# Patient Record
Sex: Male | Born: 1955 | Race: White | Hispanic: No | Smoking: Current some day smoker
Health system: Southern US, Community
[De-identification: ages and names within clinical notes are randomized; demographics above are authoritative.]

## PROBLEM LIST (undated history)

## (undated) DIAGNOSIS — L89154 Pressure ulcer of sacral region, stage 4: Secondary | ICD-10-CM

## (undated) DIAGNOSIS — G8929 Other chronic pain: Secondary | ICD-10-CM

## (undated) DIAGNOSIS — C189 Malignant neoplasm of colon, unspecified: Secondary | ICD-10-CM

## (undated) DIAGNOSIS — D649 Anemia, unspecified: Secondary | ICD-10-CM

## (undated) DIAGNOSIS — I10 Essential (primary) hypertension: Secondary | ICD-10-CM

## (undated) DIAGNOSIS — Z95828 Presence of other vascular implants and grafts: Secondary | ICD-10-CM

## (undated) DIAGNOSIS — R569 Unspecified convulsions: Secondary | ICD-10-CM

## (undated) DIAGNOSIS — Z89511 Acquired absence of right leg below knee: Secondary | ICD-10-CM

## (undated) DIAGNOSIS — L89322 Pressure ulcer of left buttock, stage 2: Secondary | ICD-10-CM

## (undated) DIAGNOSIS — A419 Sepsis, unspecified organism: Secondary | ICD-10-CM

## (undated) DIAGNOSIS — Z960 Presence of urogenital implants: Secondary | ICD-10-CM

## (undated) DIAGNOSIS — L8994 Pressure ulcer of unspecified site, stage 4: Secondary | ICD-10-CM

## (undated) DIAGNOSIS — F329 Major depressive disorder, single episode, unspecified: Secondary | ICD-10-CM

## (undated) DIAGNOSIS — G894 Chronic pain syndrome: Secondary | ICD-10-CM

## (undated) DIAGNOSIS — I219 Acute myocardial infarction, unspecified: Secondary | ICD-10-CM

## (undated) DIAGNOSIS — L89314 Pressure ulcer of right buttock, stage 4: Secondary | ICD-10-CM

## (undated) DIAGNOSIS — M8668 Other chronic osteomyelitis, other site: Secondary | ICD-10-CM

## (undated) DIAGNOSIS — W19XXXA Unspecified fall, initial encounter: Secondary | ICD-10-CM

## (undated) DIAGNOSIS — G9340 Encephalopathy, unspecified: Secondary | ICD-10-CM

## (undated) DIAGNOSIS — F4310 Post-traumatic stress disorder, unspecified: Secondary | ICD-10-CM

## (undated) DIAGNOSIS — Z89512 Acquired absence of left leg below knee: Secondary | ICD-10-CM

## (undated) HISTORY — PX: COLOSTOMY: SHX63

## (undated) HISTORY — PX: CORONARY ARTERY BYPASS GRAFT: SHX141

## (undated) HISTORY — PX: OTHER SURGICAL HISTORY: SHX169

## (undated) HISTORY — PX: TONSILLECTOMY: SUR1361

## (undated) HISTORY — PX: CHOLECYSTECTOMY: SHX55

## (undated) HISTORY — PX: BACK SURGERY: SHX140

## (undated) HISTORY — PX: APPENDECTOMY: SHX54

---

## 2007-09-11 ENCOUNTER — Ambulatory Visit (HOSPITAL_COMMUNITY): Admission: RE | Admit: 2007-09-11 | Discharge: 2007-09-11 | Payer: Self-pay | Admitting: Family Medicine

## 2007-12-02 ENCOUNTER — Inpatient Hospital Stay (HOSPITAL_COMMUNITY): Admission: AD | Admit: 2007-12-02 | Discharge: 2007-12-05 | Payer: Self-pay | Admitting: Family Medicine

## 2007-12-12 ENCOUNTER — Emergency Department (HOSPITAL_COMMUNITY): Admission: EM | Admit: 2007-12-12 | Discharge: 2007-12-13 | Payer: Self-pay | Admitting: Emergency Medicine

## 2007-12-24 ENCOUNTER — Inpatient Hospital Stay (HOSPITAL_COMMUNITY): Admission: EM | Admit: 2007-12-24 | Discharge: 2007-12-27 | Payer: Self-pay | Admitting: Emergency Medicine

## 2008-04-08 ENCOUNTER — Emergency Department (HOSPITAL_COMMUNITY): Admission: EM | Admit: 2008-04-08 | Discharge: 2008-04-08 | Payer: Self-pay | Admitting: Emergency Medicine

## 2008-04-09 ENCOUNTER — Inpatient Hospital Stay (HOSPITAL_COMMUNITY): Admission: EM | Admit: 2008-04-09 | Discharge: 2008-04-20 | Payer: Self-pay | Admitting: Emergency Medicine

## 2009-07-24 DIAGNOSIS — Z95828 Presence of other vascular implants and grafts: Secondary | ICD-10-CM

## 2009-07-24 HISTORY — DX: Presence of other vascular implants and grafts: Z95.828
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
## 2011-04-19 LAB — CBC
HCT: 41.2
Hemoglobin: 14.5
MCHC: 35.3
RBC: 4.65
RDW: 13.9

## 2011-04-19 LAB — LIPASE, BLOOD: Lipase: <10 — ABNORMAL LOW

## 2011-04-19 LAB — COMPREHENSIVE METABOLIC PANEL
ALT: 36
Alkaline Phosphatase: 109
BUN: 48 — ABNORMAL HIGH
CO2: 30
Calcium: 9.1
GFR calc non Af Amer: 50 — ABNORMAL LOW
Glucose, Bld: 126 — ABNORMAL HIGH
Potassium: 4.4
Sodium: 132 — ABNORMAL LOW

## 2011-04-19 LAB — DIFFERENTIAL
Basophils Relative: 0
Eosinophils Absolute: 0.1
Neutro Abs: 6.9
Neutrophils Relative %: 82 — ABNORMAL HIGH

## 2011-04-20 LAB — DIFFERENTIAL
Basophils Absolute: 0
Basophils Relative: 1
Basophils Relative: 1
Lymphocytes Relative: 11 — ABNORMAL LOW
Lymphs Abs: 0.9
Monocytes Relative: 4
Monocytes Relative: 4
Neutro Abs: 5
Neutro Abs: 7
Neutrophils Relative %: 82 — ABNORMAL HIGH
Neutrophils Relative %: 83 — ABNORMAL HIGH

## 2011-04-20 LAB — WOUND CULTURE

## 2011-04-20 LAB — URINALYSIS, ROUTINE W REFLEX MICROSCOPIC
Ketones, ur: NEGATIVE
Leukocytes, UA: NEGATIVE
Nitrite: NEGATIVE
Protein, ur: 30 — AB

## 2011-04-20 LAB — CBC
HCT: 33.5 — ABNORMAL LOW
Hemoglobin: 11.3 — ABNORMAL LOW
Hemoglobin: 12.2 — ABNORMAL LOW
MCHC: 34.4
MCV: 89.6
Platelets: 414 — ABNORMAL HIGH
RBC: 3.74 — ABNORMAL LOW
RBC: 3.8 — ABNORMAL LOW
RBC: 3.95 — ABNORMAL LOW
RDW: 13.6
WBC: 4.5
WBC: 5.1
WBC: 8.5

## 2011-04-20 LAB — BASIC METABOLIC PANEL
BUN: 32 — ABNORMAL HIGH
CO2: 27
Calcium: 8.1 — ABNORMAL LOW
Calcium: 8.5
Chloride: 108
Creatinine, Ser: 0.95
Creatinine, Ser: 1.38
Creatinine, Ser: 2.15 — ABNORMAL HIGH
Creatinine, Ser: 3.39 — ABNORMAL HIGH
GFR calc Af Amer: 23 — ABNORMAL LOW
GFR calc Af Amer: 40 — ABNORMAL LOW
GFR calc Af Amer: >60
GFR calc Af Amer: >60
GFR calc non Af Amer: 19 — ABNORMAL LOW
GFR calc non Af Amer: 33 — ABNORMAL LOW
GFR calc non Af Amer: 55 — ABNORMAL LOW
Potassium: 5.1
Sodium: 132 — ABNORMAL LOW
Sodium: 140

## 2011-04-20 LAB — PHOSPHORUS: Phosphorus: 3.9

## 2011-04-20 LAB — CULTURE, BLOOD (ROUTINE X 2): Culture: NO GROWTH

## 2011-04-24 LAB — BASIC METABOLIC PANEL
BUN: 13
CO2: 28
Calcium: 8.3 — ABNORMAL LOW
Calcium: 8.3 — ABNORMAL LOW
Chloride: 101
Chloride: 104
Creatinine, Ser: 0.7
Creatinine, Ser: 0.7
GFR calc Af Amer: >60
GFR calc Af Amer: >60
GFR calc Af Amer: >60
GFR calc non Af Amer: >60
GFR calc non Af Amer: >60
GFR calc non Af Amer: >60
Glucose, Bld: 102 — ABNORMAL HIGH
Potassium: 4.5
Potassium: 5
Sodium: 137
Sodium: 138
Sodium: 138

## 2011-04-24 LAB — CARDIAC PANEL(CRET KIN+CKTOT+MB+TROPI)
CK, MB: 3.2
CK, MB: 3.7
CK, MB: 4.3 — ABNORMAL HIGH
Relative Index: INVALID
Total CK: 47
Troponin I: 0.02
Troponin I: 0.03

## 2011-04-24 LAB — COMPREHENSIVE METABOLIC PANEL
ALT: 22
AST: 24
Albumin: 3.3 — ABNORMAL LOW
Alkaline Phosphatase: 127 — ABNORMAL HIGH
BUN: 13
Chloride: 100
GFR calc Af Amer: >60
Potassium: 3.1 — ABNORMAL LOW
Total Bilirubin: 0.7

## 2011-04-24 LAB — DIFFERENTIAL
Basophils Absolute: 0
Basophils Absolute: 0
Basophils Relative: 0
Basophils Relative: 1
Eosinophils Absolute: 0
Eosinophils Absolute: 0
Eosinophils Relative: 0
Lymphs Abs: 1.4
Monocytes Absolute: 0.3
Neutrophils Relative %: 72

## 2011-04-24 LAB — CBC
HCT: 38.6 — ABNORMAL LOW
MCV: 83.4
Platelets: 334
Platelets: 400
RDW: 14.5
WBC: 6.6
WBC: 6.7

## 2011-04-24 LAB — URINALYSIS, ROUTINE W REFLEX MICROSCOPIC
Glucose, UA: NEGATIVE
Ketones, ur: 15 — AB
Leukocytes, UA: NEGATIVE
Leukocytes, UA: NEGATIVE
Nitrite: NEGATIVE
Specific Gravity, Urine: 1.015
pH: 7
pH: 7.5

## 2011-04-24 LAB — URINE MICROSCOPIC-ADD ON

## 2011-04-24 LAB — RAPID URINE DRUG SCREEN, HOSP PERFORMED: Tetrahydrocannabinol: POSITIVE — AB

## 2011-04-24 LAB — ETHANOL: Alcohol, Ethyl (B): <5

## 2011-11-09 ENCOUNTER — Inpatient Hospital Stay (HOSPITAL_COMMUNITY): Payer: Medicaid Other

## 2011-11-09 ENCOUNTER — Encounter (HOSPITAL_COMMUNITY): Payer: Self-pay | Admitting: *Deleted

## 2011-11-09 ENCOUNTER — Emergency Department (HOSPITAL_COMMUNITY): Payer: Medicaid Other

## 2011-11-09 ENCOUNTER — Inpatient Hospital Stay (HOSPITAL_COMMUNITY)
Admission: EM | Admit: 2011-11-09 | Discharge: 2011-11-20 | DRG: 871 | Disposition: A | Discharge status: Home Health | Payer: Medicaid Other | Source: Ambulatory Visit | Attending: Internal Medicine | Admitting: Internal Medicine

## 2011-11-09 DIAGNOSIS — R7309 Other abnormal glucose: Secondary | ICD-10-CM | POA: Diagnosis present

## 2011-11-09 DIAGNOSIS — R0902 Hypoxemia: Secondary | ICD-10-CM | POA: Diagnosis present

## 2011-11-09 DIAGNOSIS — J9601 Acute respiratory failure with hypoxia: Secondary | ICD-10-CM | POA: Diagnosis present

## 2011-11-09 DIAGNOSIS — G9340 Encephalopathy, unspecified: Secondary | ICD-10-CM

## 2011-11-09 DIAGNOSIS — B9562 Methicillin resistant Staphylococcus aureus infection as the cause of diseases classified elsewhere: Secondary | ICD-10-CM | POA: Diagnosis present

## 2011-11-09 DIAGNOSIS — Z933 Colostomy status: Secondary | ICD-10-CM

## 2011-11-09 DIAGNOSIS — A419 Sepsis, unspecified organism: Secondary | ICD-10-CM | POA: Diagnosis present

## 2011-11-09 DIAGNOSIS — G894 Chronic pain syndrome: Secondary | ICD-10-CM | POA: Diagnosis present

## 2011-11-09 DIAGNOSIS — E46 Unspecified protein-calorie malnutrition: Secondary | ICD-10-CM | POA: Diagnosis present

## 2011-11-09 DIAGNOSIS — R7401 Elevation of levels of liver transaminase levels: Secondary | ICD-10-CM | POA: Diagnosis present

## 2011-11-09 DIAGNOSIS — R739 Hyperglycemia, unspecified: Secondary | ICD-10-CM | POA: Diagnosis present

## 2011-11-09 DIAGNOSIS — R6521 Severe sepsis with septic shock: Secondary | ICD-10-CM | POA: Diagnosis present

## 2011-11-09 DIAGNOSIS — G8929 Other chronic pain: Secondary | ICD-10-CM | POA: Diagnosis present

## 2011-11-09 DIAGNOSIS — R6520 Severe sepsis without septic shock: Secondary | ICD-10-CM | POA: Diagnosis present

## 2011-11-09 DIAGNOSIS — K922 Gastrointestinal hemorrhage, unspecified: Secondary | ICD-10-CM

## 2011-11-09 DIAGNOSIS — G9349 Other encephalopathy: Secondary | ICD-10-CM | POA: Diagnosis present

## 2011-11-09 DIAGNOSIS — B952 Enterococcus as the cause of diseases classified elsewhere: Secondary | ICD-10-CM | POA: Diagnosis present

## 2011-11-09 DIAGNOSIS — A4102 Sepsis due to Methicillin resistant Staphylococcus aureus: Principal | ICD-10-CM | POA: Diagnosis present

## 2011-11-09 DIAGNOSIS — Z1639 Resistance to other specified antimicrobial drug: Secondary | ICD-10-CM | POA: Diagnosis present

## 2011-11-09 DIAGNOSIS — J9610 Chronic respiratory failure, unspecified whether with hypoxia or hypercapnia: Secondary | ICD-10-CM

## 2011-11-09 DIAGNOSIS — S88119A Complete traumatic amputation at level between knee and ankle, unspecified lower leg, initial encounter: Secondary | ICD-10-CM

## 2011-11-09 DIAGNOSIS — E871 Hypo-osmolality and hyponatremia: Secondary | ICD-10-CM | POA: Diagnosis present

## 2011-11-09 DIAGNOSIS — D473 Essential (hemorrhagic) thrombocythemia: Secondary | ICD-10-CM | POA: Diagnosis present

## 2011-11-09 DIAGNOSIS — Z85038 Personal history of other malignant neoplasm of large intestine: Secondary | ICD-10-CM

## 2011-11-09 DIAGNOSIS — R509 Fever, unspecified: Secondary | ICD-10-CM

## 2011-11-09 DIAGNOSIS — N390 Urinary tract infection, site not specified: Secondary | ICD-10-CM | POA: Diagnosis present

## 2011-11-09 DIAGNOSIS — J9620 Acute and chronic respiratory failure, unspecified whether with hypoxia or hypercapnia: Secondary | ICD-10-CM | POA: Diagnosis present

## 2011-11-09 DIAGNOSIS — L89309 Pressure ulcer of unspecified buttock, unspecified stage: Secondary | ICD-10-CM | POA: Diagnosis present

## 2011-11-09 DIAGNOSIS — D75839 Thrombocytosis, unspecified: Secondary | ICD-10-CM | POA: Diagnosis present

## 2011-11-09 DIAGNOSIS — Z79899 Other long term (current) drug therapy: Secondary | ICD-10-CM

## 2011-11-09 DIAGNOSIS — F418 Other specified anxiety disorders: Secondary | ICD-10-CM | POA: Diagnosis present

## 2011-11-09 DIAGNOSIS — F341 Dysthymic disorder: Secondary | ICD-10-CM | POA: Diagnosis present

## 2011-11-09 DIAGNOSIS — R748 Abnormal levels of other serum enzymes: Secondary | ICD-10-CM

## 2011-11-09 DIAGNOSIS — Z978 Presence of other specified devices: Secondary | ICD-10-CM

## 2011-11-09 DIAGNOSIS — L8990 Pressure ulcer of unspecified site, unspecified stage: Secondary | ICD-10-CM | POA: Diagnosis present

## 2011-11-09 DIAGNOSIS — R7402 Elevation of levels of lactic acid dehydrogenase (LDH): Secondary | ICD-10-CM | POA: Diagnosis present

## 2011-11-09 DIAGNOSIS — D649 Anemia, unspecified: Secondary | ICD-10-CM

## 2011-11-09 DIAGNOSIS — J9600 Acute respiratory failure, unspecified whether with hypoxia or hypercapnia: Secondary | ICD-10-CM

## 2011-11-09 DIAGNOSIS — R7881 Bacteremia: Secondary | ICD-10-CM | POA: Diagnosis present

## 2011-11-09 DIAGNOSIS — A491 Streptococcal infection, unspecified site: Secondary | ICD-10-CM | POA: Diagnosis present

## 2011-11-09 DIAGNOSIS — Z89611 Acquired absence of right leg above knee: Secondary | ICD-10-CM

## 2011-11-09 DIAGNOSIS — Z89612 Acquired absence of left leg above knee: Secondary | ICD-10-CM

## 2011-11-09 DIAGNOSIS — G40909 Epilepsy, unspecified, not intractable, without status epilepticus: Secondary | ICD-10-CM | POA: Diagnosis present

## 2011-11-09 DIAGNOSIS — E876 Hypokalemia: Secondary | ICD-10-CM | POA: Diagnosis not present

## 2011-11-09 HISTORY — DX: Malignant neoplasm of colon, unspecified: C18.9

## 2011-11-09 HISTORY — DX: Unspecified convulsions: R56.9

## 2011-11-09 HISTORY — DX: Anemia, unspecified: D64.9

## 2011-11-09 HISTORY — DX: Encephalopathy, unspecified: G93.40

## 2011-11-09 LAB — COMPREHENSIVE METABOLIC PANEL
AST: 273 U/L — ABNORMAL HIGH (ref 0–37)
AST: 163 U/L — ABNORMAL HIGH (ref 0–37)
CO2: 31 mEq/L (ref 19–32)
CO2: 27 mEq/L (ref 19–32)
Calcium: 9.5 mg/dL (ref 8.4–10.5)
Chloride: 92 mEq/L — ABNORMAL LOW (ref 96–112)
Chloride: 99 mEq/L (ref 96–112)
Creatinine, Ser: 0.66 mg/dL (ref 0.50–1.35)
Creatinine, Ser: 0.89 mg/dL (ref 0.50–1.35)
GFR calc Af Amer: >90 mL/min (ref >90)
GFR calc non Af Amer: >90 mL/min (ref >90)
GFR calc non Af Amer: >90 mL/min (ref >90)
Glucose, Bld: 233 mg/dL — ABNORMAL HIGH (ref 70–99)
Total Bilirubin: 0.9 mg/dL (ref 0.3–1.2)
Total Bilirubin: 1 mg/dL (ref 0.3–1.2)

## 2011-11-09 LAB — DIFFERENTIAL
Basophils Absolute: 0 10*3/uL (ref 0.0–0.1)
Basophils Relative: 0 % (ref 0–1)
Basophils Relative: 0 % (ref 0–1)
Eosinophils Absolute: 0.1 10*3/uL (ref 0.0–0.7)
Eosinophils Relative: 1 % (ref 0–5)
Lymphocytes Relative: 14 % (ref 12–46)
Lymphs Abs: 0.7 10*3/uL (ref 0.7–4.0)
Lymphs Abs: 0.1 10*3/uL — ABNORMAL LOW (ref 0.7–4.0)
Monocytes Absolute: 0.7 10*3/uL (ref 0.1–1.0)
Monocytes Relative: 14 % — ABNORMAL HIGH (ref 3–12)
Neutro Abs: 3.5 10*3/uL (ref 1.7–7.7)
Neutro Abs: 1.4 10*3/uL — ABNORMAL LOW (ref 1.7–7.7)

## 2011-11-09 LAB — PHOSPHORUS: Phosphorus: 2.6 mg/dL (ref 2.3–4.6)

## 2011-11-09 LAB — CBC
HCT: 32.3 % — ABNORMAL LOW (ref 39.0–52.0)
HCT: 24.2 % — ABNORMAL LOW (ref 39.0–52.0)
Hemoglobin: 9.7 g/dL — ABNORMAL LOW (ref 13.0–17.0)
MCH: 24.5 pg — ABNORMAL LOW (ref 26.0–34.0)
MCHC: 30 g/dL (ref 30.0–36.0)
MCV: 81.6 fL (ref 78.0–100.0)
MCV: 80.7 fL (ref 78.0–100.0)
RBC: 3 MIL/uL — ABNORMAL LOW (ref 4.22–5.81)
RDW: 16.6 % — ABNORMAL HIGH (ref 11.5–15.5)
RDW: 16.8 % — ABNORMAL HIGH (ref 11.5–15.5)
WBC: 1.7 10*3/uL — ABNORMAL LOW (ref 4.0–10.5)

## 2011-11-09 LAB — BLOOD GAS, ARTERIAL
MECHVT: 550 mL
PEEP: 5 cmH2O
Patient temperature: 37
pCO2 arterial: 49.5 mmHg — ABNORMAL HIGH (ref 35.0–45.0)
pH, Arterial: 7.384 (ref 7.350–7.450)

## 2011-11-09 LAB — URINALYSIS, ROUTINE W REFLEX MICROSCOPIC
Bilirubin Urine: NEGATIVE
Glucose, UA: NEGATIVE mg/dL
Ketones, ur: NEGATIVE mg/dL
pH: 5.5 (ref 5.0–8.0)

## 2011-11-09 LAB — LACTIC ACID, PLASMA: Lactic Acid, Venous: 3.8 mmol/L — ABNORMAL HIGH (ref 0.5–2.2)

## 2011-11-09 LAB — PHENOBARBITAL LEVEL: Phenobarbital: 7 ug/mL — ABNORMAL LOW (ref 15.0–40.0)

## 2011-11-09 LAB — CARDIAC PANEL(CRET KIN+CKTOT+MB+TROPI)
CK, MB: 1.1 ng/mL (ref 0.3–4.0)
Relative Index: INVALID (ref 0.0–2.5)
Total CK: 81 U/L (ref 7–232)

## 2011-11-09 LAB — RAPID URINE DRUG SCREEN, HOSP PERFORMED: Amphetamines: NOT DETECTED

## 2011-11-09 LAB — PROTIME-INR: INR: 1.37 (ref 0.00–1.49)

## 2011-11-09 LAB — CARBOXYHEMOGLOBIN: Total hemoglobin: 8.1 g/dL — ABNORMAL LOW (ref 13.5–18.0)

## 2011-11-09 LAB — GLUCOSE, CAPILLARY: Glucose-Capillary: 165 mg/dL — ABNORMAL HIGH (ref 70–99)

## 2011-11-09 LAB — URINE MICROSCOPIC-ADD ON

## 2011-11-09 LAB — APTT: aPTT: 40 seconds — ABNORMAL HIGH (ref 24–37)

## 2011-11-09 MED ORDER — ETOMIDATE 2 MG/ML IV SOLN: 20.0000 mg | Freq: Once | INTRAVENOUS | Status: AC

## 2011-11-09 MED ORDER — VANCOMYCIN HCL IN DEXTROSE 1-5 GM/200ML-% IV SOLN
1000.0000 mg | Freq: Two times a day (BID) | INTRAVENOUS | Status: DC
  Administered 2011-11-09 – 2011-11-11 (×4): 1000 mg via INTRAVENOUS
  Filled 2011-11-09 (×5): qty 200

## 2011-11-09 MED ORDER — SODIUM CHLORIDE 0.9 % IV SOLN
50.0000 ug/h | INTRAVENOUS | Status: DC
  Filled 2011-11-09: qty 50

## 2011-11-09 MED ORDER — CHLORHEXIDINE GLUCONATE 0.12 % MT SOLN
15.0000 mL | Freq: Two times a day (BID) | OROMUCOSAL | Status: DC
  Administered 2011-11-09 – 2011-11-11 (×4): 15 mL via OROMUCOSAL
  Filled 2011-11-09 (×4): qty 15

## 2011-11-09 MED ORDER — PROMETHAZINE HCL 25 MG/ML IJ SOLN
12.5000 mg | Freq: Once | INTRAMUSCULAR | Status: AC
  Administered 2011-11-09: 12.5 mg via INTRAVENOUS

## 2011-11-09 MED ORDER — VANCOMYCIN HCL IN DEXTROSE 1-5 GM/200ML-% IV SOLN
1000.0000 mg | Freq: Two times a day (BID) | INTRAVENOUS | Status: DC
  Filled 2011-11-09: qty 200

## 2011-11-09 MED ORDER — BIOTENE DRY MOUTH MT LIQD
1.0000 "application " | Freq: Four times a day (QID) | OROMUCOSAL | Status: DC
  Administered 2011-11-10 – 2011-11-11 (×6): 15 mL via OROMUCOSAL

## 2011-11-09 MED ORDER — PIPERACILLIN-TAZOBACTAM 3.375 G IVPB
3.3750 g | Freq: Three times a day (TID) | INTRAVENOUS | Status: DC
  Administered 2011-11-10 (×2): 3.375 g via INTRAVENOUS
  Filled 2011-11-09 (×3): qty 50

## 2011-11-09 MED ORDER — SODIUM CHLORIDE 0.9 % IV SOLN
20.0000 ug/h | INTRAVENOUS | Status: DC
  Administered 2011-11-09: 20 ug/h via INTRAVENOUS
  Filled 2011-11-09: qty 50

## 2011-11-09 MED ORDER — PROMETHAZINE HCL 25 MG/ML IJ SOLN
INTRAMUSCULAR | Status: AC
  Administered 2011-11-09: 12.5 mg via INTRAVENOUS
  Filled 2011-11-09: qty 1

## 2011-11-09 MED ORDER — SODIUM CHLORIDE 0.9 % IV SOLN
1.0000 mg/h | INTRAVENOUS | Status: DC
  Administered 2011-11-09: 2 mg/h via INTRAVENOUS
  Administered 2011-11-10 (×2): 8 mg/h via INTRAVENOUS
  Administered 2011-11-10 – 2011-11-11 (×4): 10 mg/h via INTRAVENOUS
  Filled 2011-11-09 (×7): qty 10

## 2011-11-09 MED ORDER — FUROSEMIDE 10 MG/ML IJ SOLN
80.0000 mg | Freq: Once | INTRAMUSCULAR | Status: AC
  Administered 2011-11-09: 80 mg via INTRAVENOUS

## 2011-11-09 MED ORDER — SODIUM CHLORIDE 0.9 % IV BOLUS (SEPSIS)
500.0000 mL | INTRAVENOUS | Status: AC
  Administered 2011-11-09: 500 mL via INTRAVENOUS

## 2011-11-09 MED ORDER — PROPOFOL 10 MG/ML IV EMUL
INTRAVENOUS | Status: AC
  Filled 2011-11-09: qty 100

## 2011-11-09 MED ORDER — SODIUM CHLORIDE 0.9 % IV SOLN: 250.0000 mL | INTRAVENOUS | Status: DC | PRN

## 2011-11-09 MED ORDER — NOREPINEPHRINE BITARTRATE 1 MG/ML IJ SOLN
2.0000 ug/min | INTRAVENOUS | Status: DC
  Filled 2011-11-09: qty 4

## 2011-11-09 MED ORDER — MIDAZOLAM HCL 2 MG/2ML IJ SOLN
INTRAMUSCULAR | Status: AC
  Administered 2011-11-09: 2 mg
  Filled 2011-11-09: qty 4

## 2011-11-09 MED ORDER — ETOMIDATE 2 MG/ML IV SOLN
20.0000 mg | Freq: Once | INTRAVENOUS | Status: AC
  Administered 2011-11-09: 20 mg via INTRAVENOUS

## 2011-11-09 MED ORDER — PIPERACILLIN-TAZOBACTAM 3.375 G IVPB
3.3750 g | Freq: Once | INTRAVENOUS | Status: AC
  Administered 2011-11-09: 3.375 g via INTRAVENOUS
  Filled 2011-11-09: qty 50

## 2011-11-09 MED ORDER — SODIUM CHLORIDE 0.9 % IV BOLUS (SEPSIS)
1000.0000 mL | Freq: Once | INTRAVENOUS | Status: AC
  Administered 2011-11-09: 1000 mL via INTRAVENOUS

## 2011-11-09 MED ORDER — PANTOPRAZOLE SODIUM 40 MG IV SOLR
40.0000 mg | Freq: Every day | INTRAVENOUS | Status: DC
  Administered 2011-11-09: 40 mg via INTRAVENOUS
  Filled 2011-11-09 (×2): qty 40

## 2011-11-09 MED ORDER — NOREPINEPHRINE BITARTRATE 1 MG/ML IJ SOLN
2.0000 ug/min | INTRAVENOUS | Status: DC
  Administered 2011-11-09: 14 ug/min via INTRAVENOUS
  Administered 2011-11-09: 20 ug/min via INTRAVENOUS
  Administered 2011-11-10: 6 ug/min via INTRAVENOUS
  Administered 2011-11-11: 4 ug/min via INTRAVENOUS
  Filled 2011-11-09 (×3): qty 4

## 2011-11-09 MED ORDER — SUCCINYLCHOLINE CHLORIDE 20 MG/ML IJ SOLN: 100.0000 mg | Freq: Once | INTRAMUSCULAR | Status: AC

## 2011-11-09 MED ORDER — FENTANYL CITRATE 0.05 MG/ML IJ SOLN: 50.0000 ug | INTRAMUSCULAR | Status: DC | PRN

## 2011-11-09 MED ORDER — SUCCINYLCHOLINE CHLORIDE 20 MG/ML IJ SOLN
100.0000 mg | Freq: Once | INTRAMUSCULAR | Status: AC
  Administered 2011-11-09: 100 mg via INTRAVENOUS

## 2011-11-09 MED ORDER — METHYLPREDNISOLONE SODIUM SUCC 125 MG IJ SOLR
INTRAMUSCULAR | Status: AC
  Filled 2011-11-09: qty 2

## 2011-11-09 MED ORDER — SODIUM CHLORIDE 0.9 % IV SOLN
8.0000 mg | Freq: Once | INTRAVENOUS | Status: AC
  Administered 2011-11-09: 8 mg via INTRAVENOUS
  Filled 2011-11-09 (×2): qty 4

## 2011-11-09 MED ORDER — ACETAMINOPHEN 10 MG/ML IV SOLN
1000.0000 mg | Freq: Four times a day (QID) | INTRAVENOUS | Status: DC
  Administered 2011-11-09: 1000 mg via INTRAVENOUS
  Filled 2011-11-09 (×3): qty 100

## 2011-11-09 MED ORDER — HEPARIN SODIUM (PORCINE) 5000 UNIT/ML IJ SOLN
5000.0000 [IU] | Freq: Two times a day (BID) | INTRAMUSCULAR | Status: DC
  Administered 2011-11-09: 5000 [IU] via SUBCUTANEOUS
  Filled 2011-11-09 (×3): qty 1

## 2011-11-09 MED ORDER — PROPOFOL 10 MG/ML IV BOLUS
20.0000 mg | Freq: Once | INTRAVENOUS | Status: AC
  Administered 2011-11-09: 20 mg via INTRAVENOUS

## 2011-11-09 MED ORDER — INSULIN ASPART 100 UNIT/ML ~~LOC~~ SOLN
0.0000 [IU] | SUBCUTANEOUS | Status: DC
  Administered 2011-11-09: 3 [IU] via SUBCUTANEOUS
  Administered 2011-11-10: 2 [IU] via SUBCUTANEOUS

## 2011-11-09 MED ORDER — SODIUM CHLORIDE 0.9 % IV BOLUS (SEPSIS): 1000.0000 mL | Freq: Once | INTRAVENOUS | Status: DC

## 2011-11-09 MED ORDER — PROPOFOL 10 MG/ML IV EMUL
400.0000 ug/min | Freq: Once | INTRAVENOUS | Status: AC
  Administered 2011-11-09: 400 ug/min via INTRAVENOUS

## 2011-11-09 MED ORDER — VANCOMYCIN HCL IN DEXTROSE 1-5 GM/200ML-% IV SOLN
1000.0000 mg | Freq: Once | INTRAVENOUS | Status: DC
  Filled 2011-11-09: qty 200

## 2011-11-09 MED ORDER — CLINDAMYCIN PHOSPHATE 900 MG/50ML IV SOLN
900.0000 mg | Freq: Once | INTRAVENOUS | Status: DC
  Administered 2011-11-09: 900 mg via INTRAVENOUS
  Filled 2011-11-09: qty 50

## 2011-11-09 MED ORDER — SODIUM CHLORIDE 0.9 % IV SOLN
25.0000 ug/h | INTRAVENOUS | Status: DC
  Administered 2011-11-09: 50 ug/h via INTRAVENOUS
  Administered 2011-11-11 (×2): 400 ug/h via INTRAVENOUS
  Filled 2011-11-09 (×4): qty 50

## 2011-11-09 MED ORDER — IPRATROPIUM-ALBUTEROL 18-103 MCG/ACT IN AERO
6.0000 | INHALATION_SPRAY | RESPIRATORY_TRACT | Status: DC
  Administered 2011-11-10 (×3): 6 via RESPIRATORY_TRACT
  Filled 2011-11-09: qty 14.7

## 2011-11-09 MED ORDER — SODIUM CHLORIDE 0.9 % IV BOLUS (SEPSIS)
2000.0000 mL | Freq: Once | INTRAVENOUS | Status: DC
  Administered 2011-11-09: 2000 mL via INTRAVENOUS

## 2011-11-09 MED ORDER — IPRATROPIUM-ALBUTEROL 18-103 MCG/ACT IN AERO
6.0000 | INHALATION_SPRAY | RESPIRATORY_TRACT | Status: DC | PRN
  Administered 2011-11-10: 6 via RESPIRATORY_TRACT

## 2011-11-09 MED ORDER — FENTANYL BOLUS VIA INFUSION
50.0000 ug | Freq: Four times a day (QID) | INTRAVENOUS | Status: DC | PRN
  Filled 2011-11-09: qty 100

## 2011-11-09 NOTE — ED Notes (Signed)
Coffee ground emesis noted inside cpap mask upon arrival.

## 2011-11-09 NOTE — ED Notes (Signed)
Silver colored band with green triangles placed in medicine bag.  Silver colored ring with orange star in center placed in medicine bag.  Medicine bag placed in belongings bag and sent with pt.

## 2011-11-09 NOTE — ED Notes (Signed)
Per EMS - called out for resp distress that started today.  Pt having audible rales upon arrival per EMS.  C pap in place upon arrival.  Given solumedrol 125mg  IV en route.

## 2011-11-09 NOTE — Procedures (Signed)
Arterial Catheter Insertion Procedure Note Chris [name] [id number] 06/28/1956  Procedure: Insertion of Arterial Catheter  Indications: Blood pressure monitoring and Frequent blood sampling  Procedure Details Consent: Unable to obtain consent because of emergent medical necessity. Time Out: Verified patient identification, verified procedure, site/side was marked, verified correct patient position, special equipment/implants available, medications/allergies/relevent history reviewed, required imaging and test results available.  Performed  Maximum sterile technique was used including antiseptics, gloves, gown, hand hygiene and mask. Skin prep: Chlorhexidine; local anesthetic administered 20 gauge catheter was inserted into left radial artery using the Seldinger technique.  Evaluation Blood flow good; BP tracing good. Complications: No apparent complications.   Malachi Paradise 11/09/2011

## 2011-11-09 NOTE — Progress Notes (Addendum)
[facility]Link Physician-Brief Progress Note Patient Name: CLARANCE BOLLARD DOB: 06/04/1956 MRN: [id number]  Date of Service  11/09/2011   HPI/Events of Note   Took call from Dr Lynelle Doctor in [facility] ER at around 16.30 Patient showed up now in 2100. SIRS with fever and high wbc,  Sepsis with source likely his foul smelling decub and/or pna (vomit when ems found him). Lactate 3.8. So, occult septic shock  Got vanc, zosyn, clinda in ER per report from DR Lynelle Doctor  Note: portocath +  Now hypotensive posibly due to diprivan  intubbated due to unresponsiveness  eICU Interventions  Send admit orders Check stat labs Call code seepsis Needs cvl     Intervention Category Major Interventions: Sepsis - evaluation and management;Respiratory failure - evaluation and management  Jamareon Shimel 11/09/2011, 6:36 PM

## 2011-11-09 NOTE — ED Notes (Signed)
Vanc, Clindamycin, and 2nd liter of NS given to [facility] to give in transport.
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
## 2011-11-09 NOTE — H&P (Signed)
Name: Chris [name] MRN: [id number] DOB: Jul 15, 1956    LOS: 0  PCCM ADMISSION NOTE  Active Problems:  Acute respiratory failure with hypoxia  Septic shock  Thrombocytosis  Encephalopathy acute  Seizure disorder  Chronic respiratory failure  Elevated liver enzymes  Malnutrition  Hyperglycemia  Hyponatremia  Chronic anemia  History of Present Illness: 56 yo with colon CA and seizure disorder c/o dyspnea since 24 hours ago found unresponsive by home health nurse.  Diffuse rales , hypoxemia (SpO2 72% on ambient air). Treated with NIMV / steroids on the scene.  Intubated in [facility] ED.  Found to by hypotensive with rectal temperature of 106F.  Fluids / pressors started.  Transported to [facility] ICU for farther management.  Lines / Drains: 4/18  OETT 4/18  NGT 4/18  Foley 4/18  L [place] CVL  Cultures: 4/18  Blood 4/18  Urine 4/18  Sputum  Antibiotics: 4/18  Clindamycin x 1 4/18  Vancomycin 4/18  Zosyn  Tests / Events: 4/18  Intubated for hypoxemic respiratory failure.  Febrile, hypotensive, sepsis suspected, pressors started.  The patient is sedated, intubated and unable to provide history, which was obtained for available medical records.    Past Medical History  Diagnosis Date  . Colon cancer   . Seizures    Past Surgical History  Procedure Date  . Colostomy    Prior to Admission medications   Not on File   Allergies Allergies  Allergen Reactions  . Ibuprofen   . Naproxen   . Toradol    Family History No family history on file.  Social History  does not have a smoking history on file. He does not have any smokeless tobacco history on file. His alcohol and drug histories not on file.  Review Of Systems  Patient unable to provide  Vital Signs: Temp:  [103.4 F (39.7 C)-106 F (41.1 C)] 103.4 F (39.7 C) (04/18 1701) Pulse Rate:  [139-149] 139  (04/18 1700) Resp:  [30-40] 30  (04/18 1700) BP: (99-153)/(46-79) 99/46 mmHg (04/18 1700) SpO2:  [80 %-99 %] 99 %  (04/18 1700) FiO2 (%):  [40 %-100 %] 40 % (04/18 1820) Weight:  [72.576 kg (160 lb)] 72.576 kg (160 lb) (04/18 1639) I/O last 3 completed shifts: In: -  Out: 1000 [Emesis/NG output:1000]  Physical Examination: General:  Appears chronically ill, poorly kept Neuro:  Nonfocal, agitated when sedation is decreaed HEENT:  Dry membranes, ETT, NGT Neck:  Supple, no JVD   Cardiovascular:  Tachycardic, regular Lungs:  Bilateral air entry, coarse rhonchi, multiple well healed chest wall scars Abdomen:  Soft, nontender, nondistended, bowel sounds decreased, viable-looking colostomy Musculoskeletal:  Bilateral AKA Skin:  No rash  Ventilator settings: Vent Mode:  [-] PRVC FiO2 (%):  [40 %-100 %] 40 % Set Rate:  [14 bmp-20 bmp] 20 bmp Vt Set:  [550 mL-650 mL] 650 mL PEEP:  [4.9 cmH20-5 cmH20] 4.9 cmH20 Plateau Pressure:  [18 cmH20] 18 cmH20  Labs and Imaging:  Reviewed.  Please refer to the Assessment and Plan section for relevant results.  ASSESSMENT AND PLAN  NEUROLOGIC A:  Acute encephalopathy (metabolic, postictal, sedation). History of seizures ("has them regular" according to sister) on Phenobarbital (?).  Chronic opioids (OxyContin 240 mg PO TID (!).  History of CVA. P: -->  D/c Propofol as hypotensive -->  Versed / Fentanyl gtt to goal RASS 0 to -1 -->  Daily WUA -->  Phenobarbital level -->  Toxicology -->  Head CT when stable  PULMONARY  Lab 11/09/11 1555  PHART 7.384  PCO2ART 49.5*  PO2ART 132.0*  HCO3 28.9*  O2SAT 98.5   A:  Acute hypoxemic respiratory failure.  Chronic respiratory failure.  CXR >>> ETT, CVL in good position, cannot visualize NGT tip / side port, no overt infiltrates.  No clear history of PE. But IVC filter in place.  History of MI. P: -->  Full mechanical support -->  Goal pH>7.30, goal SpO2>92 -->  Follow up ABG -->  Follow up CXR -->  Bronchodilators -->  Daily SBT  CARDIOVASCULAR  Lab 11/09/11 1510  TROPONINI <0.30  LATICACIDVEN 3.8*    PROBNP 393.9*   A:  Shock, presumed septic, source unclear.  No active ischemia. P: -->  NS boluses to goal CVP>10 -->  Levophed to goal MAP>60 -->  Trend sepsis markers -->  TTE in AM  RENAL  Lab 11/09/11 1510  NA 134*  K 4.3  CL 92*  CO2 31  BUN 20  CREATININE 0.66  CALCIUM 9.5  MG --  PHOS --   A:  Normal renal function.  Mild hyponatremia. P: -->  CMP, Mg, Phos in AM  GASTROINTESTINAL  Lab 11/09/11 1510  AST 273*  ALT 234*  ALKPHOS 1141*  BILITOT 0.9  PROT 7.7  ALBUMIN 2.4*   A:  Elevated liver enzymes, cholestatic picture.  Not sure about alcohol consumption.  Malnutrition. P: -->  Abdomen / pelvis CT when stable -->  RUQ [place] -->  Repeat CBP in AM -->  Start TF in AM if pressor requirements decreased and abdomen cleared.  HEMATOLOGIC  Lab 11/09/11 1510  HGB 9.7*  HCT 32.3*  PLT 479*  INR --  APTT --   A:  Thrombocytosis.  Anemia.  No overt hemorrhage. ? History of lymphoma (per sister). P: -->  CBC in AM  INFECTIOUS  Lab 11/09/11 1510  WBC 5.0  PROCALCITON 3.31   A:  History of "chronic infections and low grade fever" per sister.  No clear infectious source. P: -->  Panculture -->  Empirical Vancomycin / Zosyn  ENDOCRINE  Lab 11/09/11 1832 11/09/11 1534  GLUCAP 165* 224*   A:  Hyperglycemia.  Unknown adrenal function. P: -->  CBG checks / SSI -->  Cortisol level  BEST PRACTICE / DISPOSITION -->  ICU status under PCCM -->  Full code -->  NPO -->  Heparin [place] for DVT Px -->  Protonix IV for GI Px -->  Ventilator bundle -->  Family (sister) updated over the phone  The patient is critically ill with multiple organ systems failure and requires high complexity decision making for assessment and support, frequent evaluation and titration of therapies, application of advanced monitoring technologies and extensive interpretation of multiple databases. Critical Care Time devoted to patient care services described in this note is 45  minutes.  Orlean Bradford, M.D., F.C.C.P. Pulmonary and Critical Care Medicine [facility] Cell: [phone] Pager: [phone]  11/09/2011, 7:40 PM

## 2011-11-09 NOTE — ED Notes (Signed)
Pt still actively vomiting coffee ground emesis.  edp notified and orders received.

## 2011-11-09 NOTE — Progress Notes (Signed)
Pt received to 2114 via E-Link from [facility].Marland Kitchen

## 2011-11-09 NOTE — Procedures (Signed)
Name:  DESHANNON HINCHLIFFE MRN:  [id number] DOB:  February 10, 1956  PROCEDURE NOTE  Procedure:  Central venous catheter placement.  Indications:  Need for intravenous access and hemodynamic monitoring.  Consent:  Consent was implied due to the emergency nature of the procedure.  Anesthesia:  A total of 10 mL of 1% Lidocaine was used for local infiltration anesthesia.  Procedure summary:  Appropriate equipment was assembled.  The patient was identified as Chris [name] and safety timeout was performed. The patient was placed in Trendelenburg position.  Sterile technique was used. The patient's left anterior chest wall was prepped using chlorhexidine / alcohol scrub and the field was draped in usual sterile fashion with full body drape. After the adequate anesthesia was achieved, the left subclavian vein was cannulated with the introducer needle without difficulty. A guide wire was advanced through the introducer needle, which was then withdrawn. A small skin incision was made at the point of wire entry, the dilator was inserted over the guide wire and appropriate dilation was obtained. The dilator was removed and a triple-lumen catheter was advanced over the guide wire, which was then removed.  All ports were aspirated and flushed with normal saline without difficulty. The catheter was secured into place with sutures. Antibiotic patch was placed and sterile dressing was applied. Post-procedure chest x-ray was ordered.  Complications:  No immediate complications were noted.  Hemodynamic parameters and oxygenation remained stable throughout the procedure.  Estimated blood loss:  Less then 5 mL.  Orlean Bradford, M.D., F.C.C.P. Pulmonary and Critical Care Medicine [facility] Cell: [phone]  11/09/2011, 8:52 PM

## 2011-11-09 NOTE — Progress Notes (Signed)
ANTIBIOTIC CONSULT NOTE - INITIAL  Pharmacy Consult for vanc/zosyn Indication: Sepsis/decubitus ulcers  Allergies  Allergen Reactions  . Ibuprofen   . Naproxen   . Toradol     Patient Measurements: Height: 6\' 1"  (185.4 cm) Weight: 160 lb (72.576 kg) IBW/kg (Calculated) : 79.9   Vital Signs: Temp: 103.4 F (39.7 C) (04/18 1701) Temp src: Rectal (04/18 1701) BP: 99/46 mmHg (04/18 1700) Pulse Rate: 139  (04/18 1700) Intake/Output from previous day:   Intake/Output from this shift: Total I/O In: -  Out: 1000 [Emesis/NG output:1000]  Labs:  Basename 11/09/11 1510  WBC 5.0  HGB 9.7*  PLT 479*  LABCREA --  CREATININE 0.66   Estimated Creatinine Clearance: 108.4 ml/min (by C-G formula based on Cr of 0.66). No results found for this basename: VANCOTROUGH:2,VANCOPEAK:2,VANCORANDOM:2,GENTTROUGH:2,GENTPEAK:2,GENTRANDOM:2,TOBRATROUGH:2,TOBRAPEAK:2,TOBRARND:2,AMIKACINPEAK:2,AMIKACINTROU:2,AMIKACIN:2, in the last 72 hours   Microbiology: No results found for this or any previous visit (from the past 720 hour(s)).  Medical History: Past Medical History  Diagnosis Date  . Colon cancer   . Seizures     Medications:  Scheduled:    . acetaminophen  1,000 mg Intravenous Q6H  . clindamycin (CLEOCIN) IV  900 mg Intravenous Once  . etomidate  20 mg Intravenous Once  . etomidate  20 mg Intravenous Once  . furosemide  80 mg Intravenous Once  . heparin  5,000 Units Subcutaneous Q12H  . methylPREDNISolone sodium succinate      . ondansetron (ZOFRAN) IV  8 mg Intravenous Once  . pantoprazole (PROTONIX) IV  40 mg Intravenous QHS  . piperacillin-tazobactam (ZOSYN)  IV  3.375 g Intravenous Once  . promethazine  12.5 mg Intravenous Once  . propofol  20 mg Intravenous Once  . propofol  20 mg Intravenous Once  . propofol  400 mcg/min Intravenous Once  . sodium chloride  1,000 mL Intravenous Once  . sodium chloride  1,000 mL Intravenous Once  . sodium chloride  2,000 mL Intravenous  Once  . succinylcholine  100 mg Intravenous Once  . succinylcholine  100 mg Intravenous Once  . vancomycin  1,000 mg Intravenous Once   Assessment: 56 yo pt with a h/o colon CA, seizures, paraplegia was admitted for likely sepsis from her decubitus ulcers. Empiric vanc/zosyn has been ordered. Pt received zosyn in the ED. Will go with q12 dosing for now until UOP can be assessed.   Goal of Therapy:  Vancomycin trough level 15-20 mcg/ml  Plan:  Vanc 1g IV q12 Zosyn 3.375g IV q8  Ulyses Southward Hyndman 11/09/2011,6:52 PM

## 2011-11-09 NOTE — ED Notes (Signed)
Pt attempting to pull ET tube, bucking vent.  edp notified and orders received for bolus of propofol.

## 2011-11-09 NOTE — ED Notes (Signed)
Pt vomiting through mouth, NG tube hooked to continuous suction.  NG tube flushed and aspirated with gastric contents returned and flushed easily.  Per chest x-ray, NG tube in place.

## 2011-11-10 ENCOUNTER — Inpatient Hospital Stay (HOSPITAL_COMMUNITY): Payer: Medicaid Other

## 2011-11-10 ENCOUNTER — Encounter (HOSPITAL_COMMUNITY): Payer: Self-pay | Admitting: Radiology

## 2011-11-10 DIAGNOSIS — J9600 Acute respiratory failure, unspecified whether with hypoxia or hypercapnia: Secondary | ICD-10-CM

## 2011-11-10 DIAGNOSIS — G40909 Epilepsy, unspecified, not intractable, without status epilepticus: Secondary | ICD-10-CM

## 2011-11-10 DIAGNOSIS — J9610 Chronic respiratory failure, unspecified whether with hypoxia or hypercapnia: Secondary | ICD-10-CM

## 2011-11-10 LAB — COMPREHENSIVE METABOLIC PANEL
AST: 82 U/L — ABNORMAL HIGH (ref 0–37)
Albumin: 1.7 g/dL — ABNORMAL LOW (ref 3.5–5.2)
Chloride: 104 mEq/L (ref 96–112)
Creatinine, Ser: 0.62 mg/dL (ref 0.50–1.35)
Potassium: 3.3 mEq/L — ABNORMAL LOW (ref 3.5–5.1)
Total Bilirubin: 0.6 mg/dL (ref 0.3–1.2)
Total Protein: 5.5 g/dL — ABNORMAL LOW (ref 6.0–8.3)

## 2011-11-10 LAB — GLUCOSE, CAPILLARY
Glucose-Capillary: 106 mg/dL — ABNORMAL HIGH (ref 70–99)
Glucose-Capillary: 107 mg/dL — ABNORMAL HIGH (ref 70–99)
Glucose-Capillary: 141 mg/dL — ABNORMAL HIGH (ref 70–99)
Glucose-Capillary: 98 mg/dL (ref 70–99)

## 2011-11-10 LAB — CBC
HCT: 21.9 % — ABNORMAL LOW (ref 39.0–52.0)
MCH: 26.3 pg (ref 26.0–34.0)
MCV: 79.9 fL (ref 78.0–100.0)
Platelets: 199 10*3/uL (ref 150–400)
RBC: 2.74 MIL/uL — ABNORMAL LOW (ref 4.22–5.81)

## 2011-11-10 LAB — CARDIAC PANEL(CRET KIN+CKTOT+MB+TROPI)
Relative Index: INVALID (ref 0.0–2.5)
Total CK: 60 U/L (ref 7–232)
Total CK: 47 U/L (ref 7–232)

## 2011-11-10 LAB — URINE CULTURE: Culture  Setup Time: 201304182223

## 2011-11-10 LAB — POCT I-STAT 3, ART BLOOD GAS (G3+)
Acid-Base Excess: 1 mmol/L (ref 0.0–2.0)
Patient temperature: 102.4

## 2011-11-10 LAB — PATHOLOGIST SMEAR REVIEW

## 2011-11-10 MED ORDER — CHLORHEXIDINE GLUCONATE CLOTH 2 % EX PADS
6.0000 | MEDICATED_PAD | Freq: Every day | CUTANEOUS | Status: AC
  Administered 2011-11-10 – 2011-11-13 (×3): 6 via TOPICAL

## 2011-11-10 MED ORDER — PHENOBARBITAL SODIUM 130 MG/ML IJ SOLN
100.0000 mg | Freq: Two times a day (BID) | INTRAMUSCULAR | Status: DC
  Administered 2011-11-10 – 2011-11-14 (×8): 100 mg via INTRAVENOUS
  Administered 2011-11-15: 11:00:00 via INTRAVENOUS
  Filled 2011-11-10 (×9): qty 1

## 2011-11-10 MED ORDER — FILGRASTIM 300 MCG/ML IJ SOLN
5.0000 ug/kg/d | Freq: Every day | INTRAVENOUS | Status: DC
  Administered 2011-11-10: 381 ug via INTRAVENOUS
  Filled 2011-11-10 (×2): qty 1.27

## 2011-11-10 MED ORDER — ACETAMINOPHEN 650 MG RE SUPP
650.0000 mg | Freq: Four times a day (QID) | RECTAL | Status: DC | PRN
  Administered 2011-11-10 (×2): 650 mg via RECTAL
  Filled 2011-11-10 (×2): qty 1

## 2011-11-10 MED ORDER — PHENOBARBITAL SODIUM 130 MG/ML IJ SOLN
700.0000 mg | Freq: Once | INTRAMUSCULAR | Status: DC
  Filled 2011-11-10: qty 5.38

## 2011-11-10 MED ORDER — VECURONIUM BROMIDE 10 MG IV SOLR
INTRAVENOUS | Status: AC
  Filled 2011-11-10: qty 10

## 2011-11-10 MED ORDER — FILGRASTIM 300 MCG/ML IJ SOLN: 5.0000 ug/kg/d | Freq: Every day | INTRAVENOUS | Status: DC

## 2011-11-10 MED ORDER — ACETAMINOPHEN 160 MG/5ML PO SOLN
650.0000 mg | Freq: Four times a day (QID) | ORAL | Status: DC | PRN
  Administered 2011-11-10: 650 mg
  Filled 2011-11-10: qty 20.3

## 2011-11-10 MED ORDER — VECURONIUM BROMIDE 10 MG IV SOLR
8.0000 mg | Freq: Once | INTRAVENOUS | Status: AC
  Administered 2011-11-10: 8 mg via INTRAVENOUS

## 2011-11-10 MED ORDER — DEXTROSE 5 % IV SOLN
2.0000 g | Freq: Three times a day (TID) | INTRAVENOUS | Status: DC
  Administered 2011-11-10 – 2011-11-11 (×4): 2 g via INTRAVENOUS
  Filled 2011-11-10 (×5): qty 2

## 2011-11-10 MED ORDER — SODIUM CHLORIDE 0.9 % IV SOLN: 1.0000 g | Freq: Four times a day (QID) | INTRAVENOUS | Status: DC

## 2011-11-10 MED ORDER — ENOXAPARIN SODIUM 40 MG/0.4ML ~~LOC~~ SOLN
40.0000 mg | SUBCUTANEOUS | Status: DC
  Administered 2011-11-10: 40 mg via SUBCUTANEOUS
  Filled 2011-11-10 (×2): qty 0.4

## 2011-11-10 MED ORDER — SODIUM CHLORIDE 0.9 % IV SOLN
2.0000 g | INTRAVENOUS | Status: DC
  Administered 2011-11-10 – 2011-11-11 (×6): 2 g via INTRAVENOUS
  Filled 2011-11-10 (×10): qty 2000

## 2011-11-10 MED ORDER — ACETAMINOPHEN 325 MG PO TABS: 650.0000 mg | ORAL_TABLET | Freq: Four times a day (QID) | ORAL | Status: DC | PRN

## 2011-11-10 MED ORDER — MUPIROCIN 2 % EX OINT
1.0000 "application " | TOPICAL_OINTMENT | Freq: Two times a day (BID) | CUTANEOUS | Status: DC
  Administered 2011-11-10 – 2011-11-11 (×3): 1 via NASAL
  Filled 2011-11-10: qty 22

## 2011-11-10 MED ORDER — SODIUM CHLORIDE 0.9 % IV SOLN
100.0000 mg | Freq: Every day | INTRAVENOUS | Status: DC
  Administered 2011-11-10 – 2011-11-11 (×2): 100 mg via INTRAVENOUS
  Filled 2011-11-10 (×4): qty 100

## 2011-11-10 MED ORDER — SODIUM CHLORIDE 0.9 % IV SOLN
Freq: Once | INTRAVENOUS | Status: AC
  Administered 2011-11-10: 12:00:00 via INTRAVENOUS
  Filled 2011-11-10: qty 100

## 2011-11-10 MED ORDER — PANTOPRAZOLE SODIUM 40 MG IV SOLR
40.0000 mg | Freq: Two times a day (BID) | INTRAVENOUS | Status: DC
  Administered 2011-11-10 – 2011-11-11 (×3): 40 mg via INTRAVENOUS
  Filled 2011-11-10 (×3): qty 40

## 2011-11-10 MED ORDER — POTASSIUM CHLORIDE 10 MEQ/100ML IV SOLN
10.0000 meq | INTRAVENOUS | Status: AC
  Administered 2011-11-10 (×2): 10 meq via INTRAVENOUS
  Filled 2011-11-10: qty 200

## 2011-11-10 MED ORDER — VASOPRESSIN 20 UNIT/ML IJ SOLN
0.0300 [IU]/min | INTRAVENOUS | Status: DC
  Administered 2011-11-10: 0.03 [IU]/min via INTRAVENOUS
  Filled 2011-11-10: qty 2.5

## 2011-11-10 NOTE — Progress Notes (Signed)
Portable EEG completed

## 2011-11-10 NOTE — Progress Notes (Signed)
CRITICAL VALUE ALERT  Critical value received:  WBC 0.6  Date of notification:  11/10/2011  Time of notification:  6:35 AM  Critical value read back:yes  Nurse who received alert:  Carlyon Prows  MD notified (1st page):  Ankit Gard   Time of first page:  6:35 AM  MD notified (2nd page):  Time of second page:  Responding MD:  Mabeline Caras  Time MD responded:  6:36 AM

## 2011-11-10 NOTE — Progress Notes (Signed)
ANTIBIOTIC CONSULT NOTE - INITIAL  Pharmacy Consult for Fortaz/Ampicillin/Micafungin/Neupogen/Phenobarbital Indication: septic shock, chemo/shock induced neutropenia, seizures  Allergies  Allergen Reactions  . Ibuprofen   . Naproxen   . Toradol     Patient Measurements: Height: 6\' 1"  (185.4 cm) Weight: 168 lb 6.9 oz (76.4 kg) IBW/kg (Calculated) : 79.9   Vital Signs: Temp: 101.2 F (38.4 C) (04/19 1000) Temp src: Core (Comment) (04/19 0800) BP: 111/54 mmHg (04/19 1000) Pulse Rate: 99  (04/19 1000) Intake/Output from previous day: 04/18 0701 - 04/19 0700 In: 6919 [I.V.:609; IV Piggyback:6310] Out: 2675 [Urine:1225; Emesis/NG output:1450] Intake/Output from this shift: Total I/O In: 358 [I.V.:45.5; IV Piggyback:312.5] Out: 125 [Urine:125]  Labs:  [facility] 11/10/11 0440 11/09/11 1905 11/09/11 1510  WBC 0.6* 1.7* 5.0  HGB 7.2* 7.8* 9.7*  PLT 199 298 479*  LABCREA -- -- --  CREATININE 0.62 0.89 0.66   Estimated Creatinine Clearance: 114.1 ml/min (by C-G formula based on Cr of 0.62).  Microbiology: Recent Results (from the past 720 hour(s))  CULTURE, BLOOD (ROUTINE X 2)     Status: Normal (Preliminary result)   Collection Time   11/09/11  3:10 PM      Component Value Range Status Comment   Specimen Description BLOOD LEFT ARM   Final    Special Requests     Final    Value: BOTTLES DRAWN AEROBIC AND ANAEROBIC AEB=8CC ANA=6CC   Culture NO GROWTH 1 DAY   Final    Report Status PENDING   Incomplete   CULTURE, BLOOD (ROUTINE X 2)     Status: Normal (Preliminary result)   Collection Time   11/09/11  3:25 PM      Component Value Range Status Comment   Specimen Description BLOOD RIGHT ARM   Final    Special Requests BOTTLES DRAWN AEROBIC ONLY 2CC   Final    Culture NO GROWTH 1 DAY   Final    Report Status PENDING   Incomplete   MRSA PCR SCREENING     Status: Abnormal   Collection Time   11/09/11  6:47 PM      Component Value Range Status Comment   MRSA by PCR POSITIVE  (*) NEGATIVE  Final     Medical History: Past Medical History  Diagnosis Date  . Colon cancer   . Seizures     Assessment: 56 yo M admitted with septic shock.  Unknown source at this time, possibly pyelonephritis (came in with foley), decub ulcers, has port-a-cath (last chemo last week per sister), neutropenic, and colostomy.  Will cover for meningitis and fungal organisms.  Patient also with history of seizures, was found down by home health nurse, and phenobarbital level was low on admission, unsure if having continued seizures.  Patient has a continued fever (tmax 106), and bilateral AKAs -therefore renal function may be falsely elevated.  Also experiencing coffee ground emesis.  Goal of Therapy:  Vancomycin trough level 15-20 mcg/ml Phenobarbital level 15-40  Plan:  1.  Continue Vancomycin 1000 mg IV q12hr 2.  Ampicillin 2 gm IV q 4hr (~157 mg/kg/day) 3.  Fortaz 2 gm IV q8hr 4.  Micafungin 100 mg IV daily 5.  D/C Zosyn 6.  F/up cultures, renal function, vancomycin trough as indicated 7.  Neupogen 5 mcg/kg/day IV 8.  F/up pancytopenia 9.  Phenobarbital 700 mg IV x 1 10.  Phenobarbital 100 mg IV q 12hr (was on 90 mg total pta with level of 7) 11.  Check phenobarbital level in ~1 week (  unless clinical picture changes)  Rolland Porter, Pharm.D., BCPS Clinical Pharmacist Pager: [phone]

## 2011-11-10 NOTE — Progress Notes (Signed)
  Echocardiogram 2D Echocardiogram has been performed.  Chris [name] 11/10/2011, 11:35 AM

## 2011-11-10 NOTE — Procedures (Signed)
EEG NUMBER:  REFERRING PHYSICIAN:  Mcarthur Rossetti. Tyson Alias, MD  HISTORY:  A 56 year old male on ventilator with sepsis and history of seizures, now with altered mental status.  MEDICATIONS:  Fentanyl, Versed, vecuronium, Omnipen, vancomycin, phenobarbital, heparin, Protonix, Levophed.  CONDITIONS OF RECORDING:  This is a 16-channel EEG carried out with the patient in the unresponsive state.  DESCRIPTION:  The background activity consists of a low-voltage polymorphic delta activity with a frequency of 1-2 Hz.  This is continuous throughout the tracing.  There is superimposed beta activity that is seen intermittently superimposed on the polymorphic delta activity throughout the tracing.  This beta activity is well organized. It resembles sleep spindles and is seen over both hemispheres. Hypoventilation was not performed.  Intermittent photic stimulation failed to elicit any change in the tracing.  IMPRESSION:  This EEG is characterized by general background slowing. Although this can be seen as a phenomenon of sleep in a patient on multiple sedating medications as is seen in this patient, cannot rule out the possibility of this general background slowing being secondary to a diffuse metabolic disturbance that is etiologically nonspecific. No epileptiform activity is noted.          ______________________________ Thana Farr, MD    [id number] D:  11/10/2011 18:31:09  T:  11/10/2011 23:46:32  Job #:  [id number]

## 2011-11-10 NOTE — Progress Notes (Signed)
Attempted to call emergency contact Charlyne Quale to request she come to ICU to speak with Dr Tyson Alias.  Unable to contact her; voice recording states person is unavailable.  No other contact information in chart.

## 2011-11-10 NOTE — Progress Notes (Signed)
Patient extremely agitated. Sedation titrated to max dose versed. Patient has temp 103.4. Tylenol Supp given. Patient removed A Line. Blood pressure stable.Patient finally calmed down after sedation increased. Will continue to closely monitor. MD Milbert Coulter (resident) notified.  Doree Albee
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
## 2011-11-10 NOTE — Progress Notes (Signed)
Nurse and MD Zubelevitskiy updated sister on the phone. Elnita Maxwell Locklear's number [phone] cell or [phone] Doree Albee

## 2011-11-10 NOTE — Progress Notes (Signed)
Name: Chris [name] MRN: [id number] DOB: 09/14/1955    LOS: 1  PCCM preogressive ADMISSION NOTE  History of Present Illness   History of Present Illness: 56 yo with colon CA and seizure disorder c/o dyspnea since 24 hours ago found unresponsive by home health nurse. Diffuse rales , hypoxemia (SpO2 72% on ambient air). Treated with NIMV / steroids on the scene. Intubated in [facility]. Found to by hypotensive with rectal temperature of 106F. Fluids / pressors started. Transported to [facility] ICU for farther management.  Active Problems:  Acute respiratory failure with hypoxia  Septic shock  Thrombocytosis  Encephalopathy acute  Seizure disorder  Chronic respiratory failure  Elevated liver enzymes  Malnutrition  Hyperglycemia  Hyponatremia  Chronic anemia   Lines / Drains: 4/18 OETT  4/18 NGT  4/18 Foley  4/18 L [place] CVL  Cultures: 4/18 Blood  4/18 Urine 4/18 Sputum  Antibiotics: 4/18 Clindamycin x 1  4/18 Vancomycin  4/18 Zosyn  Tests / Events: 4/18 Intubated for hypoxemic respiratory failure. Febrile, hypotensive, sepsis suspected, pressors started. 4/18 US-Surgically absent gallbladder.  2. Mild prominent size CBD probable post cholecystectomy  3. No hydronephrosis or diagnostic renal calculus.  4. No aortic aneurysm. 4/18-CT abdo/pelvis-Slight soft tissue stranding around both kidneys. This could  represent pyelonephritis but the findings are slight.  2. Patchy bilateral pulmonary infiltrates. I suspect these may  represent aspiration pneumonitis.  4/19- remains on levophed  Vital Signs: Temp:  [100.1 F (37.8 C)-106 F (41.1 C)] 101.9 F (38.8 C) (04/19 0700) Pulse Rate:  [84-149] 94  (04/19 0700) Resp:  [17-40] 17  (04/19 0700) BP: (64-153)/(36-79) 103/55 mmHg (04/19 0700) SpO2:  [80 %-100 %] 98 % (04/19 0700) Arterial Line BP: (87-157)/(46-84) 157/74 mmHg (04/19 0330) FiO2 (%):  [40 %-100 %] 40.3 % (04/19 0700) Weight:  [160 lb (72.576 kg)-168 lb 6.9 oz (76.4  kg)] 168 lb 6.9 oz (76.4 kg) (04/19 0500) I/O last 3 completed shifts: In: 6873.5 [I.V.:563.5; IV Piggyback:6310] Out: 2675 [Urine:1225; Emesis/NG output:1450]  Physical Examination: General:  Intubated, mechanically ventilated, no acute distress Neuro:  Sedated, synchronous, nonfocal HEENT:  PERRL, pink conjunctivae, moist membranes Neck:  Supple, no JVD   Cardiovascular:  RRR, no M/R/G Lungs:  Bilateral diminished air entry, diffused rhonchi and rales.  Abdomen:  Soft, nontender, nondistended, bowel sounds present Musculoskeletal:   no pedal edema Skin:  No rash,   Ventilator settings: Vent Mode:  [-] PCV FiO2 (%):  [40 %-100 %] 40.3 % Set Rate:  [10 bmp-20 bmp] 10 bmp Vt Set:  [550 mL-650 mL] 650 mL PEEP:  [4.9 cmH20-5 cmH20] 5 cmH20 Plateau Pressure:  [16 cmH20-19 cmH20] 17 cmH20  Labs and Imaging:  Reviewed.  Please refer to the Assessment and Plan section for relevant results.  ASSESSMENT AND PLAN  NEUROLOGIC A: Acute encephalopathy (metabolic, postictal, sedation). History of seizures ("has them regular" according to sister) on Phenobarbital (?). Chronic opioids (OxyContin 240 mg PO TID (!). History of CVA. Head-Ct negative for acute issue. P:  -->  Versed / Fentanyl gtt to goal RASS -2 to -3 while on PCV --> Daily WUA when vent improved --> Phenobarbital level - 7, low, start med and bolus consideration, repeat level per pharmacy  --> Toxicology reviewed --> need to investigate why on monotherapy phenobarb? --> eeg assessment needed --> change ABX for BBB, dc zosyn, add ceftaz, ampicillin  To vanc --> will consider LP, CT head neg  PULMONARY  Lab 11/10/11 0555 11/09/11 2015 11/09/11 1555  PHART 7.400 -- 7.384  PCO2ART 42.2 -- 49.5*  PO2ART 55.0* -- 132.0*  HCO3 25.7* -- 28.9*  O2SAT 84.0 70.1 98.5   A: Acute hypoxemic respiratory failure. Patchy bilateral infiltration on CT, likely due to Aspiration PNA .  No clear history of PE. But IVC filter in place.    ARDS P:  --> ARDS protocol on PCV  --> did poorly with volume control --> total pressures are way under plat 30 --> may need peep increase, see abg this am  --> maintain current MV, guarentee 20 --> abg in am   CARDIOVASCULAR  Lab 11/10/11 0500 11/10/11 0140 11/10/11 0139 11/09/11 1917 11/09/11 1907 11/09/11 1858 11/09/11 1510  TROPONINI -- -- <0.30 <0.30 -- -- <0.30  LATICACIDVEN 0.4* 2.8* -- -- 2.9* -- 3.8*  PROBNP -- -- -- -- -- 1229.0* 393.9*   A: Shock, presumed septic, source unclear. No active ischemia. History of MI.  Troponin negative X2. So far 6 L IVF in. Still need levophed to maintain bp.  P:  --> NS boluses to goal CVP>10  --> Levophed to goal MAP>60, meets criteria less severe septic shock - add vaso  --> 2D echo  in AM --> EGDT, cvp met mark , svo2 70% -->cortsiol 88.8, high risk death, no need for roids  RENAL  Lab 11/10/11 0440 11/09/11 1905 11/09/11 1510  NA 140 139 134*  K 3.3* 4.0 --  CL 104 99 92*  CO2 26 27 31   BUN 19 25* 20  CREATININE 0.62 0.89 0.66  CALCIUM 7.4* 8.2* 9.5  MG 1.5 1.7 --  PHOS 2.0* 2.6 --   A: Normal renal function. Mild hypokalemia.Marland Kitchen  P:  --> replete k --> bmp in AM --> kvo, cvp 15  GASTROINTESTINAL  Lab 11/10/11 0440 11/09/11 1905 11/09/11 1510  AST 82* 163* 273*  ALT 132* 174* 234*  ALKPHOS 751* 941* 1141*  BILITOT 0.6 1.0 0.9  PROT 5.5* 6.1 7.7  ALBUMIN 1.7* 2.0* 2.4*   A: Elevated liver enzymes, likely due to septic shock. Not sure about alcohol consumption. Malnutrition. CT-abd possible for pyelonephritis. Coffee ground emesis noted. UA is consistent with UTI. R/o decub related alk phos P:  --> pending RUQ [place] - reviewed, no hydro --> Repeat CBP in AM  --> Protonix  IV --> start TF in am , follow coffee ground concern from gut --> lft in am ??GI bleed, add ppi to bid  HEMATOLOGIC  Lab 11/10/11 0440 11/09/11 1905 11/09/11 1510  HGB 7.2* 7.8* 9.7*  HCT 21.9* 24.2* 32.3*  PLT 199 298 479*  INR -- 1.37 --   APTT -- 40* --   A: Thrombocytosis resolved. Anemia. Neutropenia.  History of lymphoma (per sister).  No overt hemorrhage. Sepsis related likely, although at risk lymphoma ?? If present (described by fam) to leukemic? P:  Calculate ANC daily In setting septic shock, consider neupogen, consider 3 days --> CBC in AM --> coags in am --> treat sepsis aggresive  INFECTIOUS  Lab 11/10/11 0440 11/09/11 1905 11/09/11 1510  WBC 0.6* 1.7* 5.0  PROCALCITON -- 30.42 3.31    A: History of "chronic infections and low grade fever" per sister. Neutropenia fever. Likely due to PNA and UTI / pyle. R/o meningitis P: A:  Neutropenia fever. Currently on vancomycin and Zosyn.  P: --> start ceftaz, vanc, amp --> consider LP with fever 106 -->  CBC in AM --> Panculture done -->BC sent  -->Has porta cath, prior colon ca, add empiric voriconazole,  and PCT not high --> no hydro  ENDOCRINE  Lab 11/10/11 0411 11/10/11 0037 11/09/11 2030 11/09/11 1832 11/09/11 1534  GLUCAP 98 141* 162* 165* 224*   A: Hyperglycemia. Unknown adrenal function.  P:  --> CBG checks / SSI  --> Cortisol level high, poor outcome predictor, no role steroids -> TSH  BEST PRACTICE / DISPOSITION -->  ICU status under PCCM -->  Full code, but need to speak to fam and consider comfort care, will attempt to call prim MD -->  NPO -->  Heparin [place] for DVT Px, dc , in setting cancer, ad lovenox 40  -->  Protonix IV for GI Px -->  Ventilator bundle -->  Family updated at bedside   Chris Harp, MD PGY1, Internal Medicine Teaching Service Pager: [phone]  Ccm time 60 min   11/10/2011, 7:45 AM  I have fully examined this patient and agree with above findings.    And edited in full.  Chris [name]. Chris Alias, MD, FACP Pgr: [phone] [facility]

## 2011-11-10 NOTE — Progress Notes (Signed)
INITIAL ADULT NUTRITION ASSESSMENT Date: 11/10/2011   Time: 10:40 AM  Reason for Assessment: VDRF  ASSESSMENT: Male 56 y.o.  Dx: Found unresponsive by home health nurse at home; Acute respiratory failure with hypoxia; septic shock; thrombocytosis; acute encephalopathy  Hx:  Past Medical History  Diagnosis Date  . Colon cancer   . Seizures     Related Meds:  Scheduled Meds:   . albuterol-ipratropium  6 puff Inhalation Q4H  . ampicillin (OMNIPEN) IV  2 g Intravenous Q4H  . antiseptic oral rinse  1 application Mouth Rinse QID  . cefTAZidime (FORTAZ)  IV  2 g Intravenous Q8H  . chlorhexidine  15 mL Mouth/Throat BID  . Chlorhexidine Gluconate Cloth  6 each Topical Q0600  . enoxaparin (LOVENOX) injection  40 mg Subcutaneous Q24H  . etomidate  20 mg Intravenous Once  . etomidate  20 mg Intravenous Once  . filgrastim (NEUPOGEN) IV  5 mcg/kg/day Intravenous q1800  . furosemide  80 mg Intravenous Once  . insulin aspart  0-15 Units Subcutaneous Q4H  . methylPREDNISolone sodium succinate      . micafungin [facility]) IV  100 mg Intravenous Daily  . midazolam      . mupirocin ointment  1 application Nasal BID  . ondansetron (ZOFRAN) IV  8 mg Intravenous Once  . pantoprazole (PROTONIX) IV  40 mg Intravenous Q12H  . piperacillin-tazobactam (ZOSYN)  IV  3.375 g Intravenous Once  . potassium chloride  10 mEq Intravenous Q1 Hr x 2  . promethazine  12.5 mg Intravenous Once  . propofol  20 mg Intravenous Once  . propofol  20 mg Intravenous Once  . propofol  400 mcg/min Intravenous Once  . sodium chloride 0.9 % 100 mL with PHENObarbital (LUMINAL) 700 mg infusion   Intravenous Once  . sodium chloride  1,000 mL Intravenous Once  . sodium chloride  500 mL Intravenous Q30 min  . succinylcholine  100 mg Intravenous Once  . succinylcholine  100 mg Intravenous Once  . vancomycin  1,000 mg Intravenous Q12H  . DISCONTD: acetaminophen  1,000 mg Intravenous Q6H  . DISCONTD: ampicillin (OMNIPEN) IV   1 g Intravenous Q6H  . DISCONTD: clindamycin (CLEOCIN) IV  900 mg Intravenous Once  . DISCONTD: filgrastim (NEUPOGEN) IV  5 mcg/kg/day (Ideal) Intravenous q1800  . DISCONTD: heparin  5,000 Units Subcutaneous Q12H  . DISCONTD: norepinephrine (LEVOPHED) Adult infusion  2-50 mcg/min Intravenous STAT  . DISCONTD: pantoprazole (PROTONIX) IV  40 mg Intravenous QHS  . DISCONTD: PHENObarbital  700 mg Intravenous Once  . DISCONTD: piperacillin-tazobactam (ZOSYN)  IV  3.375 g Intravenous Q8H  . DISCONTD: sodium chloride  1,000 mL Intravenous Once  . DISCONTD: sodium chloride  2,000 mL Intravenous Once  . DISCONTD: vancomycin  1,000 mg Intravenous Once  . DISCONTD: vancomycin  1,000 mg Intravenous Q12H   Continuous Infusions:   . fentaNYL infusion INTRAVENOUS 200 mcg/hr (11/10/11 0839)  . midazolam (VERSED) infusion 8 mg/hr (11/10/11 0620)  . norepinephrine (LEVOPHED) Adult infusion 6 mcg/min (11/10/11 0600)  . vasopressin (PITRESSIN) infusion - *FOR SHOCK*    . DISCONTD: fentaNYL infusion INTRAVENOUS 20 mcg/hr (11/09/11 1634)  . DISCONTD: fentaNYL infusion INTRAVENOUS     PRN Meds:.acetaminophen (TYLENOL) oral liquid 160 mg/5 mL, acetaminophen, acetaminophen, albuterol-ipratropium, DISCONTD: sodium chloride, DISCONTD: fentaNYL, DISCONTD: fentaNYL   Ht: 6\' 1"  (185.4 cm)  Wt: 168 lb 6.9 oz (76.4 kg)  Ideal Wt: 83.6 kg % Ideal Wt: 91%  Usual Wt: 75 kg in September 2009 % Usual  Wt: 102%  Body mass index is 22.22 kg/(m^2).  Food/Nutrition Related Hx: per MD notes patient has a history of malnutrition.  Labs:  CMP     Component Value Date/Time   NA 140 11/10/2011 0440   K 3.3* 11/10/2011 0440   CL 104 11/10/2011 0440   CO2 26 11/10/2011 0440   GLUCOSE 101* 11/10/2011 0440   BUN 19 11/10/2011 0440   CREATININE 0.62 11/10/2011 0440   CALCIUM 7.4* 11/10/2011 0440   PROT 5.5* 11/10/2011 0440   ALBUMIN 1.7* 11/10/2011 0440   AST 82* 11/10/2011 0440   ALT 132* 11/10/2011 0440   ALKPHOS 751*  11/10/2011 0440   BILITOT 0.6 11/10/2011 0440   GFRNONAA >90 11/10/2011 0440   GFRAA >90 11/10/2011 0440    CBG (last 3)   Basename 11/10/11 0812 11/10/11 0411 11/10/11 0037  GLUCAP 115* 98 141*     Intake/Output Summary (Last 24 hours) at 11/10/11 1043 Last data filed at 11/10/11 0810  Gross per 24 hour  Intake   7277 ml  Output   2800 ml  Net   4477 ml     Diet Order:  NPO  IVF:    fentaNYL infusion INTRAVENOUS Last Rate: 200 mcg/hr (11/10/11 0839)  midazolam (VERSED) infusion Last Rate: 8 mg/hr (11/10/11 0620)  norepinephrine (LEVOPHED) Adult infusion Last Rate: 6 mcg/min (11/10/11 0600)  vasopressin (PITRESSIN) infusion - *FOR SHOCK*   DISCONTD: fentaNYL infusion INTRAVENOUS Last Rate: 20 mcg/hr (11/09/11 1634)  DISCONTD: fentaNYL infusion INTRAVENOUS     Estimated Nutritional Needs:   Kcal: 2100-2200 Protein: 100-115 grams Fluid: 2.1-2.2 liters  Patient with coffee ground emesis, therefore, plans are to hold off on initiating TF until tomorrow.  Patient is intubated and unable to provide any nutrition history; no family available to provide nutrition history.  ARDS protocol in place.  Potassium is being replaced.  NG tube is in place.  NUTRITION DIAGNOSIS: -Inadequate oral intake (NI-2.1).  Status: Ongoing  RELATED TO: inability to eat  AS EVIDENCED BY: NPO status  MONITORING/EVALUATION(Goals): Goal:  Intake to meet >90% of estimated nutrition needs. Monitor:  TF initiation/tolerance/adequacy, labs, weight trend.  EDUCATION NEEDS: -Education not appropriate at this time  INTERVENTION: When able to start TF, recommend Oxepa at 25 ml/h, increase by 10 ml every 4 hours to goal rate of 55 ml/h to provide 2124 kcals, 113 grams protein, 1036 ml free water daily.  Dietitian #:  [phone]  DOCUMENTATION CODES Per approved criteria  -Not Applicable    Hettie Holstein 11/10/2011, 10:40 AM

## 2011-11-11 ENCOUNTER — Inpatient Hospital Stay (HOSPITAL_COMMUNITY): Payer: Medicaid Other

## 2011-11-11 ENCOUNTER — Encounter (HOSPITAL_COMMUNITY): Payer: Self-pay | Admitting: Nurse Practitioner

## 2011-11-11 DIAGNOSIS — R578 Other shock: Secondary | ICD-10-CM

## 2011-11-11 LAB — COMPREHENSIVE METABOLIC PANEL
AST: 39 U/L — ABNORMAL HIGH (ref 0–37)
CO2: 27 mEq/L (ref 19–32)
Calcium: 7.6 mg/dL — ABNORMAL LOW (ref 8.4–10.5)
Creatinine, Ser: 0.53 mg/dL (ref 0.50–1.35)
GFR calc non Af Amer: >90 mL/min (ref >90)
Total Protein: 5.5 g/dL — ABNORMAL LOW (ref 6.0–8.3)

## 2011-11-11 LAB — GLUCOSE, CAPILLARY
Glucose-Capillary: 87 mg/dL (ref 70–99)
Glucose-Capillary: 92 mg/dL (ref 70–99)
Glucose-Capillary: 92 mg/dL (ref 70–99)

## 2011-11-11 LAB — CBC
Hemoglobin: 6.6 g/dL — CL (ref 13.0–17.0)
MCH: 26 pg (ref 26.0–34.0)
MCHC: 31.9 g/dL (ref 30.0–36.0)
Platelets: 254 10*3/uL (ref 150–400)
RDW: 17.2 % — ABNORMAL HIGH (ref 11.5–15.5)

## 2011-11-11 LAB — LIPASE, BLOOD: Lipase: 6 U/L — ABNORMAL LOW (ref 11–59)

## 2011-11-11 MED ORDER — LORAZEPAM 1 MG PO TABS
1.0000 mg | ORAL_TABLET | Freq: Every day | ORAL | Status: DC
  Administered 2011-11-11 – 2011-11-14 (×4): 1 mg via ORAL
  Filled 2011-11-11 (×4): qty 1

## 2011-11-11 MED ORDER — CARISOPRODOL 350 MG PO TABS
350.0000 mg | ORAL_TABLET | Freq: Four times a day (QID) | ORAL | Status: DC
  Administered 2011-11-11 – 2011-11-20 (×34): 350 mg via ORAL
  Filled 2011-11-11 (×34): qty 1

## 2011-11-11 MED ORDER — MIDAZOLAM BOLUS VIA INFUSION
5.0000 mg | INTRAVENOUS | Status: DC | PRN
Start: 2011-11-11 — End: 2011-11-11
  Filled 2011-11-11: qty 20

## 2011-11-11 MED ORDER — POTASSIUM CHLORIDE 20 MEQ/15ML (10%) PO LIQD
ORAL | Status: AC
  Filled 2011-11-11: qty 30

## 2011-11-11 MED ORDER — OXYCODONE HCL 5 MG PO TABS
30.0000 mg | ORAL_TABLET | ORAL | Status: DC | PRN
  Administered 2011-11-12 – 2011-11-15 (×12): 30 mg via ORAL
  Filled 2011-11-11 (×2): qty 6
  Filled 2011-11-11: qty 5
  Filled 2011-11-11: qty 1
  Filled 2011-11-11 (×4): qty 6
  Filled 2011-11-11: qty 4
  Filled 2011-11-11: qty 1
  Filled 2011-11-11: qty 2
  Filled 2011-11-11: qty 5
  Filled 2011-11-11 (×3): qty 6

## 2011-11-11 MED ORDER — OXYCODONE HCL 60 MG PO TB12: 30.0000 mg | ORAL_TABLET | Freq: Two times a day (BID) | ORAL | Status: DC

## 2011-11-11 MED ORDER — ACETAMINOPHEN 325 MG PO TABS
650.0000 mg | ORAL_TABLET | ORAL | Status: DC | PRN
  Administered 2011-11-17: 650 mg via ORAL
  Filled 2011-11-11 (×2): qty 2

## 2011-11-11 MED ORDER — SODIUM CHLORIDE 0.9 % IV SOLN
10.0000 mg/h | INTRAVENOUS | Status: DC
  Filled 2011-11-11: qty 10

## 2011-11-11 MED ORDER — OXYCODONE HCL 10 MG PO TB12
30.0000 mg | ORAL_TABLET | Freq: Two times a day (BID) | ORAL | Status: DC
  Administered 2011-11-11 – 2011-11-15 (×8): 30 mg via ORAL
  Filled 2011-11-11: qty 2
  Filled 2011-11-11 (×2): qty 3
  Filled 2011-11-11: qty 1
  Filled 2011-11-11 (×4): qty 3
  Filled 2011-11-11: qty 1
  Filled 2011-11-11: qty 2

## 2011-11-11 MED ORDER — SODIUM CHLORIDE 0.9 % IV SOLN
100.0000 ug/h | INTRAVENOUS | Status: DC
  Administered 2011-11-12: 100 ug/h via INTRAVENOUS
  Filled 2011-11-11 (×4): qty 50

## 2011-11-11 MED ORDER — FENTANYL BOLUS VIA INFUSION
50.0000 ug | INTRAVENOUS | Status: DC | PRN
  Administered 2011-11-12 – 2011-11-13 (×3): 200 ug via INTRAVENOUS
  Filled 2011-11-11: qty 400

## 2011-11-11 MED ORDER — SODIUM CHLORIDE 0.9 % IV SOLN
INTRAVENOUS | Status: DC
  Administered 2011-11-11: 20 mL via INTRAVENOUS
  Administered 2011-11-14: 20 mL/h via INTRAVENOUS
  Administered 2011-11-17: 12:00:00 via INTRAVENOUS

## 2011-11-11 MED ORDER — POTASSIUM CHLORIDE 20 MEQ/15ML (10%) PO LIQD
40.0000 meq | Freq: Once | ORAL | Status: AC
  Administered 2011-11-11: 40 meq via ORAL
  Filled 2011-11-11: qty 30

## 2011-11-11 NOTE — Procedures (Signed)
Extubation Procedure Note  Patient Details:   Name: Chris [name] DOB: [birth year] MRN: [id number]   Airway Documentation:  AIRWAYS 8 mm (Active)  Secured at (cm) 23 cm 11/09/2011 12:00 AM    Evaluation  O2 sats: transiently fell during during procedure Complications: No apparent complications Patient did tolerate procedure well. Bilateral Breath Sounds: Rhonchi Suctioning: Airway Yes pt able to verbalize. HR 92, Sp02 86% on 4lpm [place] withdrawal of care. Bp 102/57  [name] 11/11/2011, 12:12 PM

## 2011-11-11 NOTE — Progress Notes (Signed)
Chris [name] 5:27 AM 11/11/2011  Nurse called about critical lab value of Hg 6.6 and ordered 1 unit of blood as he is here for sepsis and is more than 24 hours past ICU admission. Advised to watch respiratory status as he does have EF of 50%.  Chris [name] MRN: [id number] LOS: 2

## 2011-11-11 NOTE — Progress Notes (Signed)
eLink Physician-Brief Progress Note Patient Name: Chris [name] DOB: [birth year] MRN: [id number]  Date of Service  11/11/2011   HPI/Events of Note  Terminally extubated , doing well per RN & taking PO, asking for pain meds on 400 mcg fent   eICU Interventions  Resume PO oxycontin & oxycodone at reduced doses Replete K       Andrew Soria V. 11/11/2011, 8:38 PM

## 2011-11-11 NOTE — Progress Notes (Signed)
Name: Chris [name] MRN: [id number] DOB: Mar 24, 1956    LOS: 2  PCCM PROGRESS NOTE  History of Present Illness   History of Present Illness: 56 yo with colon CA and seizure disorder c/o dyspnea since 24 hours ago found unresponsive by home health nurse. Diffuse rales , hypoxemia (SpO2 72% on ambient air). Treated with NIMV / steroids on the scene. Intubated in [facility]. Found to by hypotensive with rectal temperature of 106F. Fluids / pressors started. Transported to [facility] ICU for farther management.  Active Problems:  Acute respiratory failure with hypoxia  Septic shock  Thrombocytosis  Encephalopathy acute  Seizure disorder  Chronic respiratory failure  Elevated liver enzymes  Malnutrition  Hyperglycemia  Hyponatremia  Chronic anemia   Lines / Drains: 4/18 OETT >> 4/18 NGT  4/18 Foley  4/18 L Raymond CVL  Cultures: 4/18 Blood >>1/2 gpc>> 4/18 Urine>>neg 4/18 Sputum  Antibiotics: 4/18 Clindamycin x 1  4/18 Vancomycin  4/18 Zosyn  off 4/18 ampicillin>> 4/18ceftaz>> 4/18 micafungin>> Tests / Events: 4/18 Intubated for hypoxemic respiratory failure. Febrile, hypotensive, sepsis suspected, pressors started. 4/18 US-Surgically absent gallbladder.  2. Mild prominent size CBD probable post cholecystectomy  3. No hydronephrosis or diagnostic renal calculus.  4. No aortic aneurysm. 4/18-CT abdo/pelvis-Slight soft tissue stranding around both kidneys. This could  represent pyelonephritis but the findings are slight.  2. Patchy bilateral pulmonary infiltrates. I suspect these may  represent aspiration pneumonitis.    Vital Signs: Temp:  [97.9 F (36.6 C)-104.3 F (40.2 C)] 99.4 F (37.4 C) (04/20 1000) Pulse Rate:  [68-118] 72  (04/20 1000) Resp:  [17-31] 17  (04/20 1000) BP: (89-133)/(40-85) 107/68 mmHg (04/20 1000) SpO2:  [88 %-99 %] 98 % (04/20 1000) FiO2 (%):  [39.9 %-40.6 %] 40.4 % (04/20 1000) I/O last 3 completed shifts: In: 3641.5 [I.V.:2231; IV  Piggyback:1410.5] Out: 3655 [Urine:2895; Emesis/NG output:760]  Physical Examination: General:  Intubated, mechanically ventilated, no acute distress Neuro:  Sedated, synchronous, nonfocal HEENT:  PERRL, pink conjunctivae, moist membranes Neck:  Supple, no JVD   Cardiovascular:  RRR, no M/R/G Lungs:  Bilateral diminished air entry, diffused rhonchi and rales.  Abdomen:  Soft, nontender, nondistended, bowel sounds present. Colostomy with stool. Musculoskeletal:   bilateral aka Skin:  No rash,   Ventilator settings: Vent Mode:  [-] PCV FiO2 (%):  [39.9 %-40.6 %] 40.4 % Set Rate:  [20 bmp] 20 bmp PEEP:  [4.9 cmH20-5.8 cmH20] 5 cmH20 Pressure Support:  [0 cmH20] 0 cmH20 Plateau Pressure:  [10 cmH20-16 cmH20] 10 cmH20  Labs and Imaging:   Lab 11/11/11 0430 11/10/11 0440 11/09/11 1905  NA 141 140 139  K 2.9* 3.3* 4.0  CL 104 104 99  CO2 27 26 27   BUN 15 19 25*  CREATININE 0.53 0.62 0.89  GLUCOSE 94 101* 168*    Lab 11/11/11 0430 11/10/11 0440 11/09/11 1905  HGB 6.6* 7.2* 7.8*  HCT 20.7* 21.9* 24.2*  WBC 1.1* 0.6* 1.7*  PLT 254 199 298    Ct Abdomen Pelvis Wo Contrast  11/10/2011  *RADIOLOGY REPORT*  Clinical Data: Elevated liver enzymes. Fever.  Hypotension. Hypoxemia.  History of colon cancer.  CT ABDOMEN AND PELVIS WITHOUT CONTRAST  Technique:  Multidetector CT imaging of the abdomen and pelvis was performed following the standard protocol without intravenous contrast.  Comparison: CT scan dated 10/03/2011  Findings: There are small patchy infiltrates in both lower lobes as well as in the right middle lobe.  These could represent aspiration pneumonitis given the  patient's clinical history.  NG tube tip in the antrum of the stomach. Moderate hiatal hernia. IVC filter in place.  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  The unenhanced liver, spleen, pancreas, adrenal glands, and bowel appear normal.  No free air or free fluid.  Foley catheter in the bladder.  There is slight soft tissue  stranding around both kidneys but there is no hydronephrosis.  There is a healing nondisplaced linear fracture of the left ilium. Old fractures of the right pubic rami.  Previous left hip fracture.  IMPRESSION:  1.  Slight soft tissue stranding around both kidneys.  This could represent pyelonephritis but the findings are slight. 2.  Patchy bilateral pulmonary infiltrates.  I suspect these may represent aspiration pneumonitis.  Original Report Authenticated By: Gwynn Burly, M.D.   Ct Head Wo Contrast  11/10/2011  *RADIOLOGY REPORT*  Clinical Data: Found unresponsive.  Coffee ground emesis.  CT HEAD WITHOUT CONTRAST  Technique:  Contiguous axial images were obtained from the base of the skull through the vertex without contrast.  Comparison: None.  Findings: There is no evidence of acute infarction, mass lesion, or intra- or extra-axial hemorrhage on CT.  Evaluation is mildly suboptimal due to motion artifact.  The posterior fossa, including the cerebellum, brainstem and fourth ventricle, is within normal limits.  The third and lateral ventricles, and basal ganglia are unremarkable in appearance.  The cerebral hemispheres are symmetric in appearance, with normal gray- white differentiation.  No mass effect or midline shift is seen.  There is no evidence of fracture; there is chronic deformity involving the right mandibular condylar head.  The orbits are within normal limits.  Mucosal thickening is noted in the right maxillary sinus; the remaining paranasal sinuses and mastoid air cells are well-aerated.  No significant soft tissue abnormalities are seen.  IMPRESSION:  1.  No acute intracranial pathology seen on CT. 2.  Chronic deformity involving the right mandibular condylar head, reflecting right-sided temporomandibular joint disease. 3.  Mucosal thickening in the right maxillary sinus.  Original Report Authenticated By: Tonia Ghent, M.D.   US Abdomen Port  11/10/2011  *RADIOLOGY REPORT*  Clinical  Data:  Elevated LFTs  COMPLETE ABDOMINAL ULTRASOUND  Comparison:  CT scan 11/10/2011  Findings:  Gallbladder:  Surgically absent  Common bile duct:  Measures 9.3 mm in diameter.  Liver:  No focal lesion identified.  Within normal limits in parenchymal echogenicity. Borderline hepatomegaly.  IVC:  Appears normal.  Pancreas:  No focal abnormality seen.  Spleen:  Measures 9 cm in length.  Normal echogenicity.  Right Kidney:  Measures 13.2 cm in length.  No hydronephrosis or diagnostic renal calculus.  Lower pole is obscured by abundant bowel gas.  Left Kidney:  Measures 12.4 cm in length.  No hydronephrosis or diagnostic renal calculus.  Abdominal aorta:  No aneurysm identified. Measures up to 2 cm in diameter.  The aortic bifurcation is obscured by abundant bowel gas.  IMPRESSION:  1.  Surgically absent gallbladder. 2.  Mild prominent size CBD probable post cholecystectomy 3.  No hydronephrosis or diagnostic renal calculus. 4.  No aortic aneurysm.  Original Report Authenticated By: Natasha Mead, M.D.   Dg Chest Port 1 View  11/11/2011  *RADIOLOGY REPORT*  Clinical Data: 56 year old male status post thoracentesis.  PORTABLE CHEST - 1 VIEW  Comparison: 11/10/2011 and earlier.  Findings: 2 portable semi upright AP views at 0530 hours.  Stable right chest Port-A-Cath.  The patient is intubated, stable endotracheal tube tip at  the level of clavicles.  Enteric tube courses to the abdomen terminating in the stomach.  Stable left subclavian central line.  Postoperative changes to the left axilla and chest wall.  No pneumothorax or pleural effusion.  Nodular and patchy bilateral air space disease persist, left lung base is relatively spared.  IMPRESSION: 1. Stable lines and tubes. 2.  Patchy bilateral pneumonia with relative sparing of the left lung base.  No significant change.  Original Report Authenticated By: Harley Hallmark, M.D.   Dg Chest Port 1 View  11/10/2011  *RADIOLOGY REPORT*  Clinical Data: Intubated,  endotracheal tube  PORTABLE CHEST - 1 VIEW  Comparison: 11/09/2011; 08/04/2011  Findings: Grossly unchanged cardiac silhouette and mediastinal contours given decreased lung volumes.  Stable positioning of support apparatus.  No pneumothorax.  The lungs remain hyperinflated.  No change to minimal increase in upper and mid lung predominant scattered nodular heterogeneous opacities.  No definite pleural effusion.  Grossly unchanged bones including old fractures of the posterior aspect of several left-sided ribs.  IMPRESSION: 1.  Stable positioning of support apparatus.  No pneumothorax. 2.  No change to minimal increase in bilateral upper and mid lung heterogeneous nodular opacities worrisome for multifocal infection, including atypicals  Original Report Authenticated By: Waynard Reeds, M.D.   Dg Chest Port 1 View  11/09/2011  *RADIOLOGY REPORT*  Clinical Data: Central line placement.  PORTABLE CHEST - 1 VIEW  Comparison: Chest 11/09/2011 at to 1539 hours.  Findings: The patient has a new left subclavian central venous catheter with the tip in the lower superior vena cava.  No pneumothorax.  Support apparatus is otherwise unchanged.  Right basilar atelectasis appears improved.  Heart size is normal.  IMPRESSION: Left subclavian catheter in good position.  No pneumothorax or other new abnormality.  Original Report Authenticated By: Bernadene Bell. Maricela Curet, M.D.   Dg Chest Port 1 View  11/09/2011  *RADIOLOGY REPORT*  Clinical Data: Shortness of breath.  Endotracheal tube placement.  PORTABLE CHEST - 1 VIEW  Comparison: 08/04/2011.  Findings: Endotracheal tube is in satisfactory position.  Right subclavian Port-A-Cath tip projects over the SVC.  Nasogastric tube is followed into the stomach.  A new linear density is seen in the right perihilar region.  There may be a slight reticulonodular pattern in the lungs.  Costophrenic angles were not included on the film.  IMPRESSION:  1.  Satisfactory endotracheal tube  placement. 2.  Question slight reticulonodular pattern in the lungs with a new linear density in the right perihilar region.  Attention on follow- up exams is warranted.  Original Report Authenticated By: Reyes Ivan, M.D.   Dg Abd Portable 1v  11/09/2011  *RADIOLOGY REPORT*  Clinical Data: Abdominal distention.  PORTABLE ABDOMEN - 1 VIEW  Comparison: CT pelvis 10/03/2011 and acute abdominal series 10/03/2011.  Findings: Gas and stool are seen in mildly prominent colon.  No definite small bowel dilatation.  IVC filter is noted.  IMPRESSION: Gas and stool in mildly prominent colon which can be seen with a colonic ileus.  Distal obstruction is difficult to exclude.  Original Report Authenticated By: Reyes Ivan, M.D.    ASSESSMENT AND PLAN  NEUROLOGIC A: Acute encephalopathy (metabolic, postictal, sedation). History of seizures ("has them regular" according to sister) on Phenobarbital (?). Chronic opioids (OxyContin 240 mg PO TID (!). History of CVA. Head-Ct negative for acute issue. P:  -->  Versed / Fentanyl gtt to goal RASS -2 to -3 while on PCV --> Daily  WUA when vent improved --> Phenobarbital level - 7, low, start med and bolus consideration, repeat level per pharmacy  --> Toxicology reviewed --> need to investigate why on monotherapy phenobarb? --> eeg assessment needed --> change ABX for BBB, dc zosyn, add ceftaz, ampicillin  To vanc --> will consider LP, CT head neg  PULMONARY  Lab 11/10/11 0555 11/09/11 2015 11/09/11 1555  PHART 7.400 -- 7.384  PCO2ART 42.2 -- 49.5*  PO2ART 55.0* -- 132.0*  HCO3 25.7* -- 28.9*  O2SAT 84.0 70.1 98.5   A: Acute hypoxemic respiratory failure. Patchy bilateral infiltration on CT, likely due to Aspiration PNA .  No clear history of PE. But IVC filter in place.  ARDS P:  --> ARDS protocol on PCV  --> did poorly with volume control --> total pressures are way under plat 30 --> may need peep increase, see abg this am  --> maintain  current MV, guarentee 20 --> abg in am   CARDIOVASCULAR  Lab 11/10/11 1040 11/10/11 0500 11/10/11 0140 11/10/11 0139 11/09/11 1917 11/09/11 1907 11/09/11 1858 11/09/11 1510  TROPONINI <0.30 -- -- <0.30 <0.30 -- -- <0.30  LATICACIDVEN -- 0.4* 2.8* -- -- 2.9* -- 3.8*  PROBNP -- -- -- -- -- -- 1229.0* 393.9*   A: Shock, presumed septic, source unclear. No active ischemia. History of MI.  Troponin negative X2. So far 6 L IVF in. Still need levophed to maintain bp.  P:  --> NS boluses to goal CVP>10  --> Levophed to goal MAP>60, meets criteria less severe septic shock - add vaso  --> 2D echo  in AM --> EGDT, cvp met mark , svo2 70% -->cortsiol 88.8, high risk death, no need for roids  RENAL  Lab 11/11/11 0430 11/10/11 0440 11/09/11 1905 11/09/11 1510  NA 141 140 139 134*  K 2.9* 3.3* -- --  CL 104 104 99 92*  CO2 27 26 27 31   BUN 15 19 25* 20  CREATININE 0.53 0.62 0.89 0.66  CALCIUM 7.6* 7.4* 8.2* 9.5  MG -- 1.5 1.7 --  PHOS -- 2.0* 2.6 --   A: Normal renal function. Mild hypokalemia.Marland Kitchen  P:  --> replete k --> bmp in AM --> kvo, cvp 15  GASTROINTESTINAL  Lab 11/11/11 0430 11/10/11 0440 11/09/11 1905 11/09/11 1510  AST 39* 82* 163* 273*  ALT 86* 132* 174* 234*  ALKPHOS 649* 751* 941* 1141*  BILITOT 0.4 0.6 1.0 0.9  PROT 5.5* 5.5* 6.1 7.7  ALBUMIN 1.6* 1.7* 2.0* 2.4*   A: Elevated liver enzymes, likely due to septic shock. Not sure about alcohol consumption. Malnutrition. CT-abd possible for pyelonephritis. Coffee ground emesis noted. UA is consistent with UTI. R/o decub related alk phos P:  -->  RUQ [place] - reviewed, no hydro --> Protonix  IV --> start TF if gut allows , follow coffee ground concern from gut ??GI bleed, add ppi to bid  HEMATOLOGIC  Lab 11/11/11 0430 11/10/11 0440 11/09/11 1905 11/09/11 1510  HGB 6.6* 7.2* 7.8* 9.7*  HCT 20.7* 21.9* 24.2* 32.3*  PLT 254 199 298 479*  INR -- -- 1.37 --  APTT -- -- 40* --   A: Thrombocytosis resolved. Anemia.  Neutropenia.  History of lymphoma (per sister).  No overt hemorrhage. Sepsis related likely, although at risk lymphoma ?? If present (described by fam) to leukemic? P:  Calculate ANC daily In setting septic shock, consider neupogen, consider 3 days --> CBC in AM --> coags in am --> treat sepsis aggresive  INFECTIOUS  Lab 11/11/11 0430 11/10/11 0440 11/09/11 1905 11/09/11 1510  WBC 1.1* 0.6* 1.7* 5.0  PROCALCITON -- -- 30.42 3.31    A: History of "chronic infections and low grade fever" per sister. Neutropenia fever. Likely due to PNA and UTI / pyle. R/o meningitis P: A:  Neutropenia fever. Currently on vancomycin and Zosyn.  P: --> start ceftaz, vanc, amp --> consider LP with fever 106 -->  CBC in AM --> Panculture done -->BC sent  -->Has porta cath, prior colon ca, add empiric voriconazole, and PCT not high --> no hydro  ENDOCRINE  Lab 11/11/11 0747 11/11/11 0427 11/11/11 0028 11/10/11 2019 11/10/11 1652  GLUCAP 92 92 87 106* 93   A: Hyperglycemia. Unknown adrenal function.  P:  --> CBG checks / SSI  --> Cortisol level high, poor outcome predictor, no role steroids -> TSH  BEST PRACTICE / DISPOSITION -->  ICU status under PCCM -->  Full code, but need to speak to fam and consider comfort care, 4/20 family asking about comfort care -->  NPO -->  Heparin [place] for DVT Px, dc , in setting cancer, ad lovenox 40  -->  Protonix IV for GI Px -->  Ventilator bundle    Brett Canales Minor ACNP Ad[facility] Pollack PCCM Pager [phone] till 3 pm If no answer page [phone] 11/11/2011, 10:15 AM   Attending MD addendum:  I spoke @ length with patient's sister, son and nephew. They are in agreement that Mr Chris [name] has lived a very hard life which has been particularly difficult in the past several months. They wish to proceed with full comfort care knowing that he would not wish to survive in a condition worse than that which he was already enduring. I have implemented the "Withdrawal..."  protocol  Billy Fischer, MD;  PCCM service; Mobile [phone]

## 2011-11-11 NOTE — Significant Event (Signed)
CRITICAL VALUE ALERT  Critical value received:  HGB 6.6 Date of notification:  11/11/2011  Time of notification:  0515  Critical value read back: yes  Nurse who received alert:Ariela Mochizuki, Johnsie Kindred   MD notified (1st page):  Dr. Dorise Hiss  Time of first page:  0520 MD notified (2nd page):   Time of second page:  Responding MD:  Dr. Dorise Hiss   Time MD responded:  Dr. Dorise Hiss

## 2011-11-11 NOTE — Progress Notes (Signed)
Per conversation with RN, will remain available to family for spiritual care, but due to pt making a positive change I will not connect with them yet.  Please page if I can be of support.  [phone]

## 2011-11-12 ENCOUNTER — Inpatient Hospital Stay (HOSPITAL_COMMUNITY): Payer: Medicaid Other

## 2011-11-12 DIAGNOSIS — G8929 Other chronic pain: Secondary | ICD-10-CM

## 2011-11-12 HISTORY — DX: Other chronic pain: G89.29

## 2011-11-12 LAB — BASIC METABOLIC PANEL
BUN: 8 mg/dL (ref 6–23)
CO2: 27 mEq/L (ref 19–32)
Glucose, Bld: 102 mg/dL — ABNORMAL HIGH (ref 70–99)
Potassium: 2.9 mEq/L — ABNORMAL LOW (ref 3.5–5.1)
Sodium: 139 mEq/L (ref 135–145)

## 2011-11-12 LAB — CBC
HCT: 27.9 % — ABNORMAL LOW (ref 39.0–52.0)
MCH: 25.6 pg — ABNORMAL LOW (ref 26.0–34.0)
MCV: 78.4 fL (ref 78.0–100.0)
Platelets: 332 10*3/uL (ref 150–400)
RBC: 3.56 MIL/uL — ABNORMAL LOW (ref 4.22–5.81)
RDW: 16.4 % — ABNORMAL HIGH (ref 11.5–15.5)
WBC: 3.3 10*3/uL — ABNORMAL LOW (ref 4.0–10.5)

## 2011-11-12 LAB — DIFFERENTIAL
Eosinophils Relative: 7 % — ABNORMAL HIGH (ref 0–5)
Lymphs Abs: 0.3 10*3/uL — ABNORMAL LOW (ref 0.7–4.0)
Monocytes Absolute: 0.3 10*3/uL (ref 0.1–1.0)
Monocytes Relative: 8 % (ref 3–12)
Neutro Abs: 2.5 10*3/uL (ref 1.7–7.7)

## 2011-11-12 LAB — CULTURE, BLOOD (ROUTINE X 2): Culture  Setup Time: 201304200221

## 2011-11-12 LAB — TYPE AND SCREEN
ABO/RH(D): A POS
Unit division: 0

## 2011-11-12 MED ORDER — PIPERACILLIN-TAZOBACTAM 3.375 G IVPB
3.3750 g | Freq: Three times a day (TID) | INTRAVENOUS | Status: DC
  Filled 2011-11-12 (×2): qty 50

## 2011-11-12 MED ORDER — VANCOMYCIN HCL IN DEXTROSE 1-5 GM/200ML-% IV SOLN
1000.0000 mg | Freq: Two times a day (BID) | INTRAVENOUS | Status: DC
  Administered 2011-11-12 – 2011-11-13 (×3): 1000 mg via INTRAVENOUS
  Filled 2011-11-12 (×5): qty 200

## 2011-11-12 MED ORDER — PANTOPRAZOLE SODIUM 40 MG IV SOLR
40.0000 mg | INTRAVENOUS | Status: DC
  Administered 2011-11-12: 40 mg via INTRAVENOUS
  Filled 2011-11-12 (×2): qty 40

## 2011-11-12 MED ORDER — HEPARIN SOD (PORK) LOCK FLUSH 100 UNIT/ML IV SOLN
500.0000 [IU] | Freq: Once | INTRAVENOUS | Status: AC
  Administered 2011-11-12: 500 [IU] via INTRAVENOUS

## 2011-11-12 MED ORDER — IPRATROPIUM-ALBUTEROL 18-103 MCG/ACT IN AERO
2.0000 | INHALATION_SPRAY | RESPIRATORY_TRACT | Status: DC
  Administered 2011-11-12: 2 via RESPIRATORY_TRACT
  Filled 2011-11-12 (×2): qty 14.7

## 2011-11-12 MED ORDER — SODIUM CHLORIDE 0.9 % IJ SOLN
10.0000 mL | INTRAMUSCULAR | Status: DC | PRN
  Administered 2011-11-12 – 2011-11-17 (×7): 10 mL via INTRAVENOUS
  Administered 2011-11-18: 20 mL via INTRAVENOUS
  Administered 2011-11-19 (×2): 10 mL via INTRAVENOUS

## 2011-11-12 MED ORDER — ALBUTEROL SULFATE (5 MG/ML) 0.5% IN NEBU
2.5000 mg | INHALATION_SOLUTION | Freq: Four times a day (QID) | RESPIRATORY_TRACT | Status: DC
  Administered 2011-11-13 – 2011-11-14 (×6): 2.5 mg via RESPIRATORY_TRACT
  Filled 2011-11-12 (×7): qty 0.5

## 2011-11-12 MED ORDER — ALBUTEROL SULFATE (5 MG/ML) 0.5% IN NEBU
2.5000 mg | INHALATION_SOLUTION | RESPIRATORY_TRACT | Status: DC | PRN
  Administered 2011-11-13: 2.5 mg via RESPIRATORY_TRACT
  Filled 2011-11-12: qty 0.5

## 2011-11-12 MED ORDER — HEPARIN SODIUM (PORCINE) 5000 UNIT/ML IJ SOLN
5000.0000 [IU] | Freq: Three times a day (TID) | INTRAMUSCULAR | Status: DC
  Administered 2011-11-12 – 2011-11-13 (×3): 5000 [IU] via SUBCUTANEOUS
  Filled 2011-11-12 (×9): qty 1

## 2011-11-12 MED ORDER — IPRATROPIUM BROMIDE 0.02 % IN SOLN
0.5000 mg | Freq: Four times a day (QID) | RESPIRATORY_TRACT | Status: DC
  Administered 2011-11-13 – 2011-11-14 (×6): 0.5 mg via RESPIRATORY_TRACT
  Filled 2011-11-12 (×7): qty 2.5

## 2011-11-12 MED ORDER — SODIUM CHLORIDE 0.9 % IV SOLN
10.0000 ug/h | INTRAVENOUS | Status: DC
  Administered 2011-11-12: 400 ug/h via INTRAVENOUS
  Administered 2011-11-13: 200 ug/h via INTRAVENOUS
  Administered 2011-11-13 (×2): 400 ug/h via INTRAVENOUS
  Administered 2011-11-13: 200 ug/h via INTRAVENOUS
  Administered 2011-11-13: 400 ug/h via INTRAVENOUS
  Administered 2011-11-13: 200 ug/h via INTRAVENOUS
  Administered 2011-11-14 (×3): 400 ug/h via INTRAVENOUS
  Filled 2011-11-12 (×7): qty 50

## 2011-11-12 NOTE — Progress Notes (Signed)
ANTIBIOTIC CONSULT NOTE - INITIAL  Pharmacy Consult for Vancomycin and Zosyn Indication: MRSA bacteremia and Enterococcal UTI and PNA  Allergies  Allergen Reactions  . Ibuprofen   . Naproxen   . Toradol     Patient Measurements: Height: 6\' 1"  (185.4 cm) Weight: 168 lb 6.9 oz (76.4 kg) IBW/kg (Calculated) : 79.9   Vital Signs: Temp: 100.1 F (37.8 C) (04/21 0900) BP: 143/85 mmHg (04/21 0900) Pulse Rate: 90  (04/21 0800) Intake/Output from previous day: 04/20 0701 - 04/21 0700 In: 1990.1 [I.V.:1310.1; Blood:350; NG/GT:30; IV Piggyback:300] Out: 1243 [Urine:593; Stool:650] Intake/Output from this shift: Total I/O In: 400 [P.O.:240; I.V.:160] Out: 430 [Urine:430]  Labs:  [facility] 11/12/11 0900 11/11/11 0430 11/10/11 0440  WBC 3.3* 1.1* 0.6*  HGB 9.1* 6.6* 7.2*  PLT 332 254 199  LABCREA -- -- --  CREATININE 0.42* 0.53 0.62   Estimated Creatinine Clearance: 114.1 ml/min (by C-G formula based on Cr of 0.42).  Microbiology: Recent Results (from the past 720 hour(s))  CULTURE, BLOOD (ROUTINE X 2)     Status: Normal   Collection Time   11/09/11  3:10 PM      Component Value Range Status Comment   Specimen Description BLOOD LEFT ARM   Final    Special Requests     Final    Value: BOTTLES DRAWN AEROBIC AND ANAEROBIC AEB=8CC ANA=6CC   Culture  Setup Time [id number]   Final    Culture     Final    Value: METHICILLIN RESISTANT STAPHYLOCOCCUS AUREUS     Note: This organism DOES NOT demonstrate inducible Clindamycin resistance in vitro. RIFAMPIN AND GENTAMICIN SHOULD NOT BE USED AS SINGLE DRUGS FOR TREATMENT OF STAPH INFECTIONS.     Note: Gram Stain Report Called to,Read Back By and Verified With: JOHNSON T AT [facility] 11/10/11 1306 THOMPSON S CRITICAL RESULT CALLED TO, READ BACK BY AND VERIFIED WITH: RN L. SCRUGGS ON 11/12/11 BY DTERRY   Report Status 11/12/2011 FINAL   Final    Organism ID, Bacteria METHICILLIN RESISTANT STAPHYLOCOCCUS AUREUS   Final   CULTURE, BLOOD  (ROUTINE X 2)     Status: Normal (Preliminary result)   Collection Time   11/09/11  3:25 PM      Component Value Range Status Comment   Specimen Description BLOOD RIGHT ARM   Final    Special Requests BOTTLES DRAWN AEROBIC ONLY 2CC   Final    Culture NO GROWTH 2 DAYS   Final    Report Status PENDING   Incomplete   URINE CULTURE     Status: Normal (Preliminary result)   Collection Time   11/09/11  4:03 PM      Component Value Range Status Comment   Specimen Description URINE, CATHETERIZED   Final    Special Requests NONE   Final    Culture  Setup Time [id number]   Final    Colony Count >=100,000 COLONIES/ML   Final    Culture ENTEROCOCCUS SPECIES   Final    Report Status PENDING   Incomplete   MRSA PCR SCREENING     Status: Abnormal   Collection Time   11/09/11  6:47 PM      Component Value Range Status Comment   MRSA by PCR POSITIVE (*) NEGATIVE  Final   URINE CULTURE     Status: Normal   Collection Time   11/09/11 10:03 PM      Component Value Range Status Comment   Specimen Description URINE, CATHETERIZED  Final    Special Requests NONE   Final    Culture  Setup Time [id number]   Final    Colony Count NO GROWTH   Final    Culture NO GROWTH   Final    Report Status 11/10/2011 FINAL   Final     Assessment: 56 yo M to restart Vancomycin today for MRSA 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 Tm 102.8. Pt initially with neutropenia - now ANC up to 2.5 after one dose of neupogen on 4/19. CrCl may be falsely elevated due to pt s/p AKA. UOP only 0.3 ml/kg/hr yesterday. Noted plan for palliative care but MD to continue to treat infections until pt is full palliative care  Goal of Therapy:  Vancomycin trough level 15-20 mcg/ml  Plan:  1.  Restart Vancomycin 1000 mg IV q12hr. First dose now. 2.  F/up cultures, renal function, vancomycin trough at [facility]  Christoper Fabian, PharmD, BCPS Clinical pharmacist, pager [phone] 11/12/2011 10:52 AM

## 2011-11-12 NOTE — Progress Notes (Signed)
Pt awake and tolerating diet. Pt requesting additional pain medications in addition to fentanyl . Elink MD notified of this and previous AM labs. Orders pending. Will continue to monitor and update as needed

## 2011-11-12 NOTE — Progress Notes (Signed)
CRITICAL VALUE ALERT  Critical value received:  Blood Cultures + MRSA; sensitivities available.  Date of notification:  11/12/2011  Time of notification:  1035  Critical value read back:yes  Nurse who received alert:  Nancy Nordmann, RN  MD notified (1st page):  Dr. Sung Amabile  Time of first page:  On unit  MD notified (2nd page):  Time of second page:  Responding MD:  Dr. Sung Amabile  Time MD responded: [phone]

## 2011-11-12 NOTE — Progress Notes (Addendum)
Name: Chris [name] MRN: [id number] DOB: [birth year]    LOS: 3  PCCM PROGRESS NOTE  History of Present Illness   History of Present Illness: 56 yo with colon CA and seizure disorder c/o dyspnea since 24 hours ago found unresponsive by home health nurse. Diffuse rales , hypoxemia (SpO2 72% on ambient air). Treated with NIMV / steroids on the scene. Intubated in [facility]. Found to by hypotensive with rectal temperature of 106F. Fluids / pressors started. Transported to [facility] ICU for farther management.  Active Problems:  Acute respiratory failure with hypoxia  Septic shock  Thrombocytosis  Encephalopathy acute  Seizure disorder  Chronic respiratory failure  Elevated liver enzymes  Malnutrition  Hyperglycemia  Hyponatremia  Chronic anemia   Lines / Drains: 4/18 OETT >>4/20 4/18 NGT>>>4/20 4/18 Foley  4/18 L [place] CVL  Cultures: 4/18 Blood >>1/2 gpc>>METHICILLIN RESISTANT STAPHYLOCOCCUS AUREUS 4/18 Urine: 100, 000 colonies/ml, endterococcus 4/18 Sputum  Antibiotics: 4/18 Clindamycin x 1  4/18 Vancomycin>>>4/20, restarted on 4/21 4/18 Zosyn >>>4/20 4/18 ampicillin>>4/20 4/18 ceftaz>>4/20 4/18 micafungin>>4/20 Tests / Events: 4/18 Intubated for hypoxemic respiratory failure. Febrile, hypotensive, sepsis suspected, pressors started. 4/18 US-Surgically absent gallbladder.  2. Mild prominent size CBD probable post cholecystectomy  3. No hydronephrosis or diagnostic renal calculus.  4. No aortic aneurysm. 4/18-CT abdo/pelvis-Slight soft tissue stranding around both kidneys. This could  represent pyelonephritis but the findings are slight.  2. Patchy bilateral pulmonary infiltrates. I suspect these may  represent aspiration pneumonitis. 4/20 extubated.    Vital Signs: Temp:  [99.3 F (37.4 C)-102.8 F (39.3 C)] 100.7 F (38.2 C) (04/21 1100) Pulse Rate:  [81-107] 90  (04/21 0800) Resp:  [10-31] 28  (04/21 1100) BP: (91-146)/(45-100) 117/62 mmHg (04/21 1100) SpO2:  [87  %-100 %] 95 % (04/21 1000) FiO2 (%):  [40 %-40.5 %] 40.5 % (04/20 1200) I/O last 3 completed shifts: In: 3188.5 [I.V.:2098.5; Blood:350; NG/GT:30; IV Piggyback:710] Out: 2188 [Urine:1338; Emesis/NG output:200; Stool:650]  Physical Examination:  General: resting in bed, not in acute distress HEENT: PERRL, EOMI, no scleral icterus Cardiac: S1/S2, RRR, No murmurs, gallops or rubs Pulm: Bilateral diminished air entry, diffused rhonchi and rales.  Abd: Soft,  nondistended, nontender, no rebound pain, no organomegaly, BS present Ext: No rashes or edema Neuro: alert and oriented X3, cranial nerves II-XII grossly intact, muscle strength 5/5 in all extremeties,  sensation to light touch intact.    Ventilator settings: not on ventilator  Labs and Imaging:   Lab 11/12/11 0900 11/11/11 0430 11/10/11 0440  NA 139 141 140  K 2.9* 2.9* 3.3*  CL 101 104 104  CO2 27 27 26   BUN 8 15 19   CREATININE 0.42* 0.53 0.62  GLUCOSE 102* 94 101*    Lab 11/12/11 0900 11/11/11 0430 11/10/11 0440  HGB 9.1* 6.6* 7.2*  HCT 27.9* 20.7* 21.9*  WBC 3.3* 1.1* 0.6*  PLT 332 254 199    Dg Chest Port 1 View  11/12/2011  *RADIOLOGY REPORT*  Clinical Data: Status post thoracentesis.  Chest congestion and productive cough. Extubated on 04/20  PORTABLE CHEST - 1 VIEW  Comparison: 11/11/2011  Findings: Right Port-A-Cath tip overlies the superior vena cava. The left central line tip overlies the superior vena cava. Endotracheal tube and nasogastric tube have been removed.  Heart size is within normal limits accounting for portable position. There are patchy infiltrates in the lungs bilaterally, increased on the left since previous study.  Findings are consistent with infectious process, favored over pulmonary edema.  Aeration  of the right lung is probably stable accounting for differences in technique.  IMPRESSION:  1.  Status post extubation and removal of nasogastric tube. 2.  Increased left lung infiltrate.  Original  Report Authenticated By: Patterson Hammersmith, M.D.   Dg Chest Port 1 View  11/11/2011  *RADIOLOGY REPORT*  Clinical Data: 56 year old male status post thoracentesis.  PORTABLE CHEST - 1 VIEW  Comparison: 11/10/2011 and earlier.  Findings: 2 portable semi upright AP views at 0530 hours.  Stable right chest Port-A-Cath.  The patient is intubated, stable endotracheal tube tip at the level of clavicles.  Enteric tube courses to the abdomen terminating in the stomach.  Stable left subclavian central line.  Postoperative changes to the left axilla and chest wall.  No pneumothorax or pleural effusion.  Nodular and patchy bilateral air space disease persist, left lung base is relatively spared.  IMPRESSION: 1. Stable lines and tubes. 2.  Patchy bilateral pneumonia with relative sparing of the left lung base.  No significant change.  Original Report Authenticated By: Harley Hallmark, M.D.    ASSESSMENT AND PLAN  Goal of care:  Dr. Tyson Alias spoke @ length with patient's family members.  They are in agreement that Chris [name] has lived a very hard life which has been particularly difficult in the past several months. They wish to proceed with full comfort care knowing that he would not wish to survive in a condition worse than that which he was already enduring. Patient's mental stats improved and is currently alert. He agrees with palliative care. Current plan is to treat what we know now, including vancomycin for positive blood culture of METHICILLIN RESISTANT STAPHYLOCOCCUS AUREUS and positive urine culture for enterococcus, pain medications, phenobartital for seizure disorder and connivent SOB.    NEUROLOGIC A: Acute encephalopathy (metabolic, postictal, sedation). History of seizures ("has them regular" according to sister) on Phenobarbital (?). Chronic opioids (OxyContin 240 mg PO TID (!). History of CVA. Head-Ct negative for acute issue. Patient woke up and was extubated yesterday. Now he is alter and  oriented.  P:  --> BMP in AM  PULMONARY  Lab 11/10/11 0555 11/09/11 2015 11/09/11 1555  PHART 7.400 -- 7.384  PCO2ART 42.2 -- 49.5*  PO2ART 55.0* -- 132.0*  HCO3 25.7* -- 28.9*  O2SAT 84.0 70.1 98.5   A: Acute hypoxemic respiratory failure. Patchy bilateral infiltration on CT, likely due to Aspiration PNA .  No clear history of PE. But IVC filter in place.  ARDS vs. aspiration PNA. He is febrile and neutropenia. Blood culture is positive for METHICILLIN RESISTANT STAPHYLOCOCCUS AUREUS    P:  --> IV vancomycin. --> CBC in AM and CXR follow up  CARDIOVASCULAR  Lab 11/10/11 1040 11/10/11 0500 11/10/11 0140 11/10/11 0139 11/09/11 1917 11/09/11 1907 11/09/11 1858 11/09/11 1510  TROPONINI <0.30 -- -- <0.30 <0.30 -- -- <0.30  LATICACIDVEN -- 0.4* 2.8* -- -- 2.9* -- 3.8*  PROBNP -- -- -- -- -- -- 1229.0* 393.9*   A: Shock, presumed septic, source unclear. No active ischemia. History of MI.  Troponin negative X3.  Off pressors. 2D echo showed EF 50% and diffused hypokinesis, may be due to chemotherapy  P:  -->cortsiol 88.8, high risk death,  --> careful IVF, current [facility]  RENAL  Lab 11/12/11 0900 11/11/11 0430 11/10/11 0440 11/09/11 1905 11/09/11 1510  NA 139 141 140 139 134*  K 2.9* 2.9* -- -- --  CL 101 104 104 99 92*  CO2 27 27 26 27  31  BUN 8 15 19  25* 20  CREATININE 0.42* 0.53 0.62 0.89 0.66  CALCIUM 8.1* 7.6* 7.4* 8.2* 9.5  MG -- -- 1.5 1.7 --  PHOS -- -- 2.0* 2.6 --   A: Normal renal function on yesterday. hypokaliemia was depleted.  P:  --> bmp in AM   GASTROINTESTINAL  Lab 11/11/11 0430 11/10/11 0440 11/09/11 1905 11/09/11 1510  AST 39* 82* 163* 273*  ALT 86* 132* 174* 234*  ALKPHOS 649* 751* 941* 1141*  BILITOT 0.4 0.6 1.0 0.9  PROT 5.5* 5.5* 6.1 7.7  ALBUMIN 1.6* 1.7* 2.0* 2.4*   A: Elevated liver enzymes trending down, likely due to septic shock. Not sure about alcohol consumption. Malnutrition. CT-abd possible for pyelonephritis. Coffee ground  emesis noted.   P:  --> RUQ [place] - reviewed, no hydro --> Protonix  IV   HEMATOLOGIC  Lab 11/12/11 0900 11/11/11 0430 11/10/11 0440 11/09/11 1905 11/09/11 1510  HGB 9.1* 6.6* 7.2* 7.8* 9.7*  HCT 27.9* 20.7* 21.9* 24.2* 32.3*  PLT 332 254 199 298 479*  INR -- -- -- 1.37 --  APTT -- -- -- 40* --   A: Thrombocytosis resolved. Anemia. Neutropenia. Most likely due to lymphoma.  No overt hemorrhage. If present (described by fam) to leukemic? Last chemotherapy was last week.  P:   --> Calculate ANC daily --> CBC in AM  INFECTIOUS  Lab 11/12/11 0900 11/11/11 0430 11/10/11 0440 11/09/11 1905 11/09/11 1510  WBC 3.3* 1.1* 0.6* 1.7* 5.0  PROCALCITON -- -- -- 30.42 3.31    A: History of "chronic infections and low grade fever" per sister. Neutropenia fever. Likely due to aspiration PNA and UTI / pyle. Urine culure showed 100,000 enterococcus. Blood culture showed STAPHYLOCOCCUS AUREUS (1/2). [place] no hydronephrosis.  P: --> vancomycin -->CBC in AM  ENDOCRINE  Lab 11/11/11 1111 11/11/11 0747 11/11/11 0427 11/11/11 0028 11/10/11 2019  GLUCAP 81 92 92 87 106*   A: Hyperglycemia when came in. Unknown adrenal function.  P:  --> CBG checks / SSI  --> Cortisol level high, poor outcome predictor, no role steroids -> TSH    Lorretta Harp, MD PGY1, Internal Medicine Teaching Service Pager: [phone]   Seen on CCM rounds this morning with resident MD or ACNP above.  Pt examined and database reviewed. I agree with above findings, assessment and plan as reflected in the note above. I confirmed with patient this morning that we will focus our efforts on palliation which is what he wants more than anything. He does not wish to undergo repeat intubation under any circumstances. Son updated over phone. Will transfer his care to [facility] and have requested PCU input  Billy Fischer, MD;  PCCM service; [place] [phone]

## 2011-11-12 NOTE — Progress Notes (Signed)
Chris [name] is doing much better than expected since Palliative Care orders were instituted yesterday.  MD please re-evaluate plan of care.  Patient will likely discharge home.  Patient's sister has stated that he can be discharged to her home if able.

## 2011-11-12 NOTE — Consult Note (Signed)
Consult Note from the Palliative Medicine Team at [facility] Patient YN:Chris [name] BENCE TRAPP      DOB: 01/28/1956      ZHY:[id number]   Consult Requested by: Dr. Sung Amabile     PCP: No primary provider on file. Reason for Consultation: GOC and sx rec    Phone Number:None  Assessment and Plan: 56 year old white male with multiple medical problems most recently a significant decubitus ulcer admitted with sepsis and respiratory failure requiring intubation.  He was subsequently was extubated to comfort but improved in his cognition.  Currently, he has been on a fentanyl drip but is awake alert with complaint of uncontrolled pain.  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  He asks for help with his pain and with the end of his life. 1. Code Status: DNR 2. Symptom Control: 1. Acute on Chronic pain:  Continue the Fentanyl for now with the oxycodone and Oxycontin.  I would like to speak with critical care about switching to dilaudid and considering discontinuing the oral meds because of the potential for misuse. 2. Dyspnea: still present but at least stable.  The opiates are helping 3. Colon Cancer/lymphoma:  Per Dr. Tyson Alias after discussing this with Dr. Cleone Slim this is not his limiting factor,  Chemo  Week prior to admission. 4. UTI with enterococcus.  Treated with antibiotics 5. MRSA bacteremia. Still on Vancomycin 6. Seizure disorder: continue phenobarbital 3. Psycho/Social: patient has two sons with whom he communicates , and daughter with whom he does not communicate with. The patient's sister states that she is prepared to take him home but that we should not discuss the word "hospice" with him.  She prefers to use the word cancer nurses.  I am concerned that the offer to go home with his sister is an offer that may not have full support. 4. 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 5. Disposition: To be determined.     Patient Documents Completed or Given: Document Given Completed  Advanced Directives Pkt    MOST    DNR    Gone from My Sight    Hard Choices      Brief HPI: 56 year old with colon cancer, decubitus ulcer presented in septic shock and respiratory failure. Patient was extubated to comfort but remains reasonably stable.     ROS: "pain all over",  Mild dyspnea    PMH:  Past Medical History  Diagnosis Date  . Colon cancer   . Seizures      PSH: Past Surgical History  Procedure Date  . Colostomy    I have reviewed the FH and SH and  If appropriate update it with new information. Allergies  Allergen Reactions  . Ibuprofen   . Naproxen   . Toradol    Scheduled Meds:   . albuterol-ipratropium  2 puff Inhalation Q4H  . carisoprodol  350 mg Oral QID  . Chlorhexidine Gluconate Cloth  6 each Topical Q0600  . heparin subcutaneous  5,000 Units Subcutaneous Q8H  . heparin lock flush  500 Units Intravenous Once  . LORazepam  1 mg Oral QHS  . oxyCODONE  30 mg Oral Q12H  . pantoprazole (PROTONIX) IV  40 mg Intravenous Q24H  . PHENObarbital  100 mg Intravenous BID  . potassium chloride  40 mEq Oral Once  . potassium chloride      . vancomycin  1,000 mg Intravenous  Q12H  . DISCONTD: Oxycodone HCl  30 mg Oral BID  . DISCONTD: piperacillin-tazobactam (ZOSYN)  IV  3.375 g Intravenous Q8H   Continuous Infusions:   . sodium chloride 20 mL/hr at 11/11/11 1900  . fentaNYL infusion INTRAVENOUS 400 mcg/hr (11/12/11 1200)  . DISCONTD: fentaNYL infusion INTRAVENOUS 100 mcg/hr (11/12/11 0950)  . DISCONTD: midazolam (VERSED) infusion     PRN Meds:.acetaminophen (TYLENOL) oral liquid 160 mg/5 mL, acetaminophen, acetaminophen, fentaNYL, oxycodone, sodium chloride, DISCONTD: acetaminophen, DISCONTD: midazolam    BP 117/62  Pulse 90  Temp(Src) 100.8 F (38.2 C) (Core (Comment))  Resp 23  Ht 6\' 1"  (1.854 m)  Wt 76.4 kg (168 lb 6.9 oz)  BMI 22.22 kg/m2  SpO2 98%   PPS:30  %   Intake/Output Summary (Last 24 hours) at 11/12/11 1611 Last data filed at 11/12/11 1200  Gross per 24 hour  Intake   1590 ml  Output   1625 ml  Net    -35 ml   LBM: 4/20                       Physical Exam:  General: mild distress,  Pain and mild shortness of breath HEENT:  PERRL, EOMI, poor dentition Chest: decreased , rhonchi CVS: regular Abdomen: not fully examined Ext: ambutations Neuro: awake, alert, orient to time and place  Labs: CBC    Component Value Date/Time   WBC 3.3* 11/12/2011 0900   RBC 3.56* 11/12/2011 0900   HGB 9.1* 11/12/2011 0900   HCT 27.9* 11/12/2011 0900   PLT 332 11/12/2011 0900   MCV 78.4 11/12/2011 0900   MCH 25.6* 11/12/2011 0900   MCHC 32.6 11/12/2011 0900   RDW 16.4* 11/12/2011 0900   LYMPHSABS 0.3* 11/12/2011 0900   MONOABS 0.3 11/12/2011 0900   EOSABS 0.2 11/12/2011 0900   BASOSABS 0.0 11/12/2011 0900     CMP     Component Value Date/Time   NA 139 11/12/2011 0900   K 2.9* 11/12/2011 0900   CL 101 11/12/2011 0900   CO2 27 11/12/2011 0900   GLUCOSE 102* 11/12/2011 0900   BUN 8 11/12/2011 0900   CREATININE 0.42* 11/12/2011 0900   CALCIUM 8.1* 11/12/2011 0900   PROT 5.5* 11/11/2011 0430   ALBUMIN 1.6* 11/11/2011 0430   AST 39* 11/11/2011 0430   ALT 86* 11/11/2011 0430   ALKPHOS 649* 11/11/2011 0430   BILITOT 0.4 11/11/2011 0430   GFRNONAA >90 11/12/2011 0900   GFRAA >90 11/12/2011 0900    Chest Xray Reviewed/Impressions: increased left lung infiltrate  CT scan of the Head Reviewed/Impressions: no intracranial process    Time In Time Out Total Time Spent with Patient Total Overall Time  400 pm 5 pm 40 min 60 min    Greater than 50%  of this time was spent counseling and coordinating care related to the above assessment and plan.    [name] L. Ladona Ridgel, MD MBA The Palliative Medicine Team at [facility] Phone: [phone] Pager: [phone]            co

## 2011-11-13 LAB — URINE CULTURE
Colony Count: >=100000
Culture  Setup Time: 201304190637

## 2011-11-13 LAB — CBC
HCT: 26.2 % — ABNORMAL LOW (ref 39.0–52.0)
Hemoglobin: 8.6 g/dL — ABNORMAL LOW (ref 13.0–17.0)
MCH: 25.9 pg — ABNORMAL LOW (ref 26.0–34.0)
MCHC: 32.8 g/dL (ref 30.0–36.0)
MCV: 78.9 fL (ref 78.0–100.0)

## 2011-11-13 LAB — BASIC METABOLIC PANEL
BUN: <3 mg/dL — ABNORMAL LOW (ref 6–23)
CO2: 30 mEq/L (ref 19–32)
Chloride: 96 mEq/L (ref 96–112)
Creatinine, Ser: 0.42 mg/dL — ABNORMAL LOW (ref 0.50–1.35)
Glucose, Bld: 114 mg/dL — ABNORMAL HIGH (ref 70–99)
Potassium: 2.3 mEq/L — CL (ref 3.5–5.1)

## 2011-11-13 MED ORDER — POTASSIUM CHLORIDE 10 MEQ/50ML IV SOLN
10.0000 meq | INTRAVENOUS | Status: DC
  Administered 2011-11-13: 10 meq via INTRAVENOUS
  Filled 2011-11-13 (×5): qty 50

## 2011-11-13 MED ORDER — PANTOPRAZOLE SODIUM 40 MG PO TBEC
40.0000 mg | DELAYED_RELEASE_TABLET | Freq: Every day | ORAL | Status: DC
  Administered 2011-11-15 – 2011-11-20 (×6): 40 mg via ORAL
  Filled 2011-11-13 (×8): qty 1

## 2011-11-13 MED ORDER — POTASSIUM CHLORIDE 20 MEQ/15ML (10%) PO LIQD
40.0000 meq | Freq: Two times a day (BID) | ORAL | Status: DC
Start: 2011-11-13 — End: 2011-11-13
  Administered 2011-11-13: 40 meq via ORAL
  Filled 2011-11-13 (×2): qty 30

## 2011-11-13 MED ORDER — ONDANSETRON HCL 4 MG/2ML IJ SOLN
4.0000 mg | Freq: Four times a day (QID) | INTRAMUSCULAR | Status: DC | PRN
  Administered 2011-11-13 – 2011-11-16 (×2): 4 mg via INTRAVENOUS
  Filled 2011-11-13 (×3): qty 2

## 2011-11-13 NOTE — Progress Notes (Signed)
CRITICAL VALUE ALERT  Critical value received:  Urine culture positive for VRE  Date of notification:  11/13/11  Time of notification:  1355  Critical value read back:yes  Nurse who received alert:  Selinda Flavin  MD notified (1st page):  Lorinda Creed, NP  Time of first page:  1400  MD notified (2nd page):  Time of second page:  Responding MD:  Lorinda Creed  Time MD responded:  1400

## 2011-11-13 NOTE — Progress Notes (Addendum)
11/13/2011 Palliative Medicine Team SW 4:22 PM Pt discussed in PMT rounds, referred for psychosocial support. Pt sleeping, no family present, did not disturb. Spoke with RN who reports pt now full comfort measures. RN reports pts made statements of EOL acceptance and has desire to d/c to home under sister's care; family agreeable if appropriate. SW aware of family dynamics at play. Will follow up tomorrow for further support.   Kennieth Francois, [place]  Pager [phone]

## 2011-11-13 NOTE — Progress Notes (Signed)
Progress Note from the Palliative Medicine Team at [facility]  Subjective: pt alert and oriented, engaged in conversation regarding his GOC and options,  Pt's  sister Cherly Locklear at bedside     Objective: Allergies  Allergen Reactions  . Ibuprofen   . Naproxen   . Toradol    Scheduled Meds:   . albuterol  2.5 mg Nebulization Q6H  . carisoprodol  350 mg Oral QID  . Chlorhexidine Gluconate Cloth  6 each Topical Q0600  . ipratropium  0.5 mg Nebulization Q6H  . LORazepam  1 mg Oral QHS  . oxyCODONE  30 mg Oral Q12H  . pantoprazole  40 mg Oral Q1200  . PHENObarbital  100 mg Intravenous BID  . potassium chloride  10 mEq Intravenous Q1 Hr x 5  . DISCONTD: albuterol-ipratropium  2 puff Inhalation Q4H  . DISCONTD: heparin subcutaneous  5,000 Units Subcutaneous Q8H  . DISCONTD: pantoprazole (PROTONIX) IV  40 mg Intravenous Q24H  . DISCONTD: potassium chloride  40 mEq Oral BID  . DISCONTD: vancomycin  1,000 mg Intravenous Q12H   Continuous Infusions:   . sodium chloride 20 mL/hr at 11/11/11 1900  . fentaNYL infusion INTRAVENOUS 200 mcg/hr (11/13/11 1427)   PRN Meds:.acetaminophen, acetaminophen, albuterol, fentaNYL, ondansetron (ZOFRAN) IV, oxycodone, sodium chloride, DISCONTD: acetaminophen (TYLENOL) oral liquid 160 mg/5 mL  BP 100/47  Pulse 92  Temp(Src) 98.2 F (36.8 C) (Oral)  Resp 16  Ht 6\' 1"  (1.854 m)  Wt 76.4 kg (168 lb 6.9 oz)  BMI 22.22 kg/m2  SpO2 92%   PPS:30%  Pain Score:continues to c/o at scale of 6/10 in spite of aggressive interventions Pain Location back pain, sacrum   Intake/Output Summary (Last 24 hours) at 11/13/11 1514 Last data filed at 11/13/11 0532  Gross per 24 hour  Intake    460 ml  Output   1250 ml  Net   -790 ml      ZOX:[id number] noted brown liquid stool      Physical Exam:  General: sleepy, but awakens easily and engages in conversation HEENT:  Moist mucosus membranes Chest:   Diminished in bases, scattered rh CVS:  RRR Abdomen:noted colostomy, soft +BS minimal tenderness Ext: noted B aka Neuro:alert and oriented  Labs: CBC    Component Value Date/Time   WBC 3.9* 11/13/2011 0820   RBC 3.32* 11/13/2011 0820   HGB 8.6* 11/13/2011 0820   HCT 26.2* 11/13/2011 0820   PLT 334 11/13/2011 0820   MCV 78.9 11/13/2011 0820   MCH 25.9* 11/13/2011 0820   MCHC 32.8 11/13/2011 0820   RDW 16.3* 11/13/2011 0820   LYMPHSABS 0.3* 11/12/2011 0900   MONOABS 0.3 11/12/2011 0900   EOSABS 0.2 11/12/2011 0900   BASOSABS 0.0 11/12/2011 0900    BMET    Component Value Date/Time   NA 134* 11/13/2011 0820   K 2.3* 11/13/2011 0820   CL 96 11/13/2011 0820   CO2 30 11/13/2011 0820   GLUCOSE 114* 11/13/2011 0820   BUN <3* 11/13/2011 0820   CREATININE 0.42* 11/13/2011 0820   CALCIUM 7.7* 11/13/2011 0820   GFRNONAA >90 11/13/2011 0820   GFRAA >90 11/13/2011 0820    CMP     Component Value Date/Time   NA 134* 11/13/2011 0820   K 2.3* 11/13/2011 0820   CL 96 11/13/2011 0820   CO2 30 11/13/2011 0820   GLUCOSE 114* 11/13/2011 0820   BUN <3* 11/13/2011 0820   CREATININE 0.42* 11/13/2011 0820   CALCIUM 7.7* 11/13/2011 0820  PROT 5.5* 11/11/2011 0430   ALBUMIN 1.6* 11/11/2011 0430   AST 39* 11/11/2011 0430   ALT 86* 11/11/2011 0430   ALKPHOS 649* 11/11/2011 0430   BILITOT 0.4 11/11/2011 0430   GFRNONAA >90 11/13/2011 0820   GFRAA >90 11/13/2011 0820      A detailed discussion was had today regarding advanced directives.  Concepts specific to code status, artifical feeding and hydration, continued IV antibiotics and rehospitalization was had.  The difference between a aggressive medical intervention path  and a palliative comfort care path for this patient at this time was had.  Values and goals of care important to patient and family were attempted to be elicited.  Concept of Hospice and Palliative Care were discussed  Natural trajectory and expectations at EOL were discussed.  Questions and concerns addressed.  Hard Choices booklet left for  review. Family encouraged to call with questions or concerns.  PMT will continue to support holistically.    Assessment and Plan: 1. Code Status:DNR/DNI- shift to full comfort care     -dc daily labs, iv fluids and antibiotics, further diagnostics  2. Symptom Control:will continue with present pain management, will reassess after full comfort measures have been put in place 3. Psycho/Social:long conversation with patient, he "know I have little time" and is OK with it, he finds comfort and prays to the"spirits of the stars"      -many concerns expressed by his sister regarding patients drug addiction      -discussed overall poor prognosis as it relates to transfer to her home in  [place] area 4. Spiritual- talks openly about his [employer] and spiritual beleifs 5. Disposition:  Patient wishes to  Go home with sister in [place], she is looking at the logistics of this.  I would recommend re evaluation of this patient over the next 48 hrs since we are stopping antibiotics and not correcting labs.  I believe he will decline rapidly and a residential would be appropriate.   Discussed above with patients son Marta Antu. Jewel and he agrees with a full comfort path for his dad.  He wants to support his fathers wishes.  Comfort is main focus of care   Time In Time Out Total Time Spent with Patient Total Overall Time  1200 1330 90 90    Greater than 50%  of this time was spent counseling and coordinating care related to the above assessment and plan.  Lorinda Creed NP   1
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
## 2011-11-13 NOTE — Progress Notes (Signed)
CRITICAL VALUE ALERT  Critical value received:  Potassium Level of 2.3  Date of notification:  11/13/11  Time of notification:  0920  Critical value read back:yes  Nurse who received alert:  Selinda Flavin  MD notified (1st page):  Dr. Mahala Menghini  Time of first page:  0930  MD notified (2nd page):  Time of second page:  Responding MD:  Dr. Mahala Menghini    Time MD responded:  [phone]

## 2011-11-14 LAB — CULTURE, BLOOD (ROUTINE X 2)

## 2011-11-14 MED ORDER — LINEZOLID 2 MG/ML IV SOLN
600.0000 mg | Freq: Two times a day (BID) | INTRAVENOUS | Status: DC
  Administered 2011-11-14 – 2011-11-20 (×11): 600 mg via INTRAVENOUS
  Filled 2011-11-14 (×14): qty 300

## 2011-11-14 MED ORDER — DEXTROMETHORPHAN POLISTIREX 30 MG/5ML PO LQCR
30.0000 mg | Freq: Two times a day (BID) | ORAL | Status: DC
  Administered 2011-11-14 – 2011-11-20 (×12): 30 mg via ORAL
  Filled 2011-11-14 (×14): qty 5

## 2011-11-14 MED ORDER — MORPHINE SULFATE 2 MG/ML IJ SOLN
2.0000 mg | INTRAMUSCULAR | Status: DC | PRN
  Administered 2011-11-14 – 2011-11-15 (×5): 2 mg via INTRAVENOUS
  Filled 2011-11-14 (×5): qty 1

## 2011-11-14 MED ORDER — ALBUTEROL SULFATE (5 MG/ML) 0.5% IN NEBU
2.5000 mg | INHALATION_SOLUTION | Freq: Three times a day (TID) | RESPIRATORY_TRACT | Status: DC
  Administered 2011-11-15 – 2011-11-18 (×11): 2.5 mg via RESPIRATORY_TRACT
  Filled 2011-11-14 (×13): qty 0.5

## 2011-11-14 MED ORDER — IPRATROPIUM BROMIDE 0.02 % IN SOLN
0.5000 mg | Freq: Three times a day (TID) | RESPIRATORY_TRACT | Status: DC
  Administered 2011-11-15 – 2011-11-18 (×11): 0.5 mg via RESPIRATORY_TRACT
  Filled 2011-11-14 (×13): qty 2.5

## 2011-11-14 MED ORDER — MORPHINE SULFATE 25 MG/ML IV SOLN
5.0000 mg/h | INTRAVENOUS | Status: DC
  Administered 2011-11-14 (×2): 5 mg/h via INTRAVENOUS
  Filled 2011-11-14: qty 10
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
## 2011-11-14 NOTE — Progress Notes (Signed)
Progress Note from the Palliative Medicine Team at [facility]  Subjective:pt "tired" today, alert and oriented and continues to c/o ineffective pain control     Objective: Allergies  Allergen Reactions  . Ibuprofen   . Naproxen   . Toradol    Scheduled Meds:   . albuterol  2.5 mg Nebulization Q6H  . carisoprodol  350 mg Oral QID  . Chlorhexidine Gluconate Cloth  6 each Topical Q0600  . ipratropium  0.5 mg Nebulization Q6H  . LORazepam  1 mg Oral QHS  . oxyCODONE  30 mg Oral Q12H  . pantoprazole  40 mg Oral Q1200  . PHENObarbital  100 mg Intravenous BID  . DISCONTD: heparin subcutaneous  5,000 Units Subcutaneous Q8H  . DISCONTD: pantoprazole (PROTONIX) IV  40 mg Intravenous Q24H  . DISCONTD: potassium chloride  10 mEq Intravenous Q1 Hr x 5  . DISCONTD: potassium chloride  40 mEq Oral BID  . DISCONTD: vancomycin  1,000 mg Intravenous Q12H   Continuous Infusions:   . sodium chloride 20 mL/hr at 11/11/11 1900  . morphine    . DISCONTD: fentaNYL infusion INTRAVENOUS 400 mcg/hr (11/14/11 0816)   PRN Meds:.acetaminophen, acetaminophen, albuterol, morphine injection, ondansetron (ZOFRAN) IV, oxycodone, sodium chloride, DISCONTD: acetaminophen (TYLENOL) oral liquid 160 mg/5 mL, DISCONTD: fentaNYL  BP 111/49  Pulse 89  Temp(Src) 97.5 F (36.4 C) (Oral)  Resp 18  Ht 6\' 1"  (1.854 m)  Wt 76.4 kg (168 lb 6.9 oz)  BMI 22.22 kg/m2  SpO2 96%   PPS:30%  Pain Score: "at times 10/10" Pain Location lower back   Intake/Output Summary (Last 24 hours) at 11/14/11 0924 Last data filed at 11/14/11 0500  Gross per 24 hour  Intake    830 ml  Output   3300 ml  Net  -2470 ml      LBM: colsotomy     Physical Exam:  General: chronically ill appearing, NAD HEENT:  Moist membranes Chest:   diminished in bases   CTA CVS: RRR Abdomen: soft non tender, noted colsotomy Ext:  B aka Neuro: alert and oriented X3  Labs: CBC    Component Value Date/Time   WBC 3.9* 11/13/2011 0820   RBC 3.32* 11/13/2011 0820   HGB 8.6* 11/13/2011 0820   HCT 26.2* 11/13/2011 0820   PLT 334 11/13/2011 0820   MCV 78.9 11/13/2011 0820   MCH 25.9* 11/13/2011 0820   MCHC 32.8 11/13/2011 0820   RDW 16.3* 11/13/2011 0820   LYMPHSABS 0.3* 11/12/2011 0900   MONOABS 0.3 11/12/2011 0900   EOSABS 0.2 11/12/2011 0900   BASOSABS 0.0 11/12/2011 0900    BMET    Component Value Date/Time   NA 134* 11/13/2011 0820   K 2.3* 11/13/2011 0820   CL 96 11/13/2011 0820   CO2 30 11/13/2011 0820   GLUCOSE 114* 11/13/2011 0820   BUN <3* 11/13/2011 0820   CREATININE 0.42* 11/13/2011 0820   CALCIUM 7.7* 11/13/2011 0820   GFRNONAA >90 11/13/2011 0820   GFRAA >90 11/13/2011 0820    CMP     Component Value Date/Time   NA 134* 11/13/2011 0820   K 2.3* 11/13/2011 0820   CL 96 11/13/2011 0820   CO2 30 11/13/2011 0820   GLUCOSE 114* 11/13/2011 0820   BUN <3* 11/13/2011 0820   CREATININE 0.42* 11/13/2011 0820   CALCIUM 7.7* 11/13/2011 0820   PROT 5.5* 11/11/2011 0430   ALBUMIN 1.6* 11/11/2011 0430   AST 39* 11/11/2011 0430   ALT 86* 11/11/2011 0430  ALKPHOS 649* 11/11/2011 0430   BILITOT 0.4 11/11/2011 0430   GFRNONAA >90 11/13/2011 0820   GFRAA >90 11/13/2011 0820       Assessment and Plan: 1. Code Status:DNR/DNI-comfort is main focus of care 2. Symptom Control:discussed with patient plan to change to morphine gtt and consolidate medications     -will convert to morphine gtt with prn bolus  3. Psycho/Social: emotional support offered, patient is sleepy presetnly 4. Spiritual: feels spiritual connected to native beleifs 5. Disposition:dependent on outcomes, I believe it will become necessary to make decision as it relates to home with hospice (all ore open to this) in sister's home or residential hospice if eligible (close to his sister's home in [place])  Patient Documents Completed or Given: Document Given Completed  Advanced Directives Pkt    MOST    DNR    Gone from [facility]    Hard Choices  yes   Dr Mahala Menghini  aware of the above.  Lorinda Creed NP   [phone] 1

## 2011-11-14 NOTE — Progress Notes (Signed)
Wasted 125cc of fentanyl drip into the sink in the med room. Verified by Richardean Canal, RN.

## 2011-11-15 DIAGNOSIS — F418 Other specified anxiety disorders: Secondary | ICD-10-CM | POA: Diagnosis present

## 2011-11-15 LAB — CBC
MCH: 25.6 pg — ABNORMAL LOW (ref 26.0–34.0)
MCHC: 32.1 g/dL (ref 30.0–36.0)
MCV: 79.8 fL (ref 78.0–100.0)
Platelets: 362 10*3/uL (ref 150–400)
RBC: 3.32 MIL/uL — ABNORMAL LOW (ref 4.22–5.81)

## 2011-11-15 LAB — BASIC METABOLIC PANEL
CO2: 34 mEq/L — ABNORMAL HIGH (ref 19–32)
Calcium: 7.8 mg/dL — ABNORMAL LOW (ref 8.4–10.5)
Creatinine, Ser: 0.43 mg/dL — ABNORMAL LOW (ref 0.50–1.35)
GFR calc non Af Amer: >90 mL/min (ref >90)

## 2011-11-15 MED ORDER — POTASSIUM CHLORIDE CRYS ER 20 MEQ PO TBCR
40.0000 meq | EXTENDED_RELEASE_TABLET | Freq: Three times a day (TID) | ORAL | Status: DC
  Filled 2011-11-15: qty 2

## 2011-11-15 MED ORDER — ALPRAZOLAM 0.25 MG PO TABS
1.0000 mg | ORAL_TABLET | Freq: Three times a day (TID) | ORAL | Status: DC
  Administered 2011-11-15 – 2011-11-20 (×15): 1 mg via ORAL
  Filled 2011-11-15: qty 4
  Filled 2011-11-15: qty 3
  Filled 2011-11-15 (×3): qty 4
  Filled 2011-11-15: qty 3
  Filled 2011-11-15: qty 4
  Filled 2011-11-15: qty 1
  Filled 2011-11-15: qty 4
  Filled 2011-11-15: qty 1
  Filled 2011-11-15 (×3): qty 4
  Filled 2011-11-15: qty 1
  Filled 2011-11-15 (×4): qty 4

## 2011-11-15 MED ORDER — POTASSIUM CHLORIDE CRYS ER 20 MEQ PO TBCR
40.0000 meq | EXTENDED_RELEASE_TABLET | Freq: Two times a day (BID) | ORAL | Status: DC
  Administered 2011-11-16: 40 meq via ORAL
  Filled 2011-11-15 (×3): qty 2

## 2011-11-15 MED ORDER — OXYCODONE HCL 10 MG PO TB12
80.0000 mg | ORAL_TABLET | Freq: Three times a day (TID) | ORAL | Status: DC
  Administered 2011-11-15 – 2011-11-19 (×12): 80 mg via ORAL
  Filled 2011-11-15 (×8): qty 8

## 2011-11-15 MED ORDER — OXYCODONE HCL 5 MG PO TABS
20.0000 mg | ORAL_TABLET | ORAL | Status: DC | PRN
  Administered 2011-11-15 – 2011-11-20 (×19): 20 mg via ORAL
  Filled 2011-11-15 (×19): qty 4

## 2011-11-15 MED ORDER — BUPROPION HCL ER (XL) 150 MG PO TB24: 150.0000 mg | ORAL_TABLET | Freq: Every day | ORAL | Status: DC

## 2011-11-15 MED ORDER — POTASSIUM CHLORIDE 10 MEQ/100ML IV SOLN
10.0000 meq | INTRAVENOUS | Status: AC
  Administered 2011-11-15 (×4): 10 meq via INTRAVENOUS
  Filled 2011-11-15 (×4): qty 100

## 2011-11-15 NOTE — Progress Notes (Signed)
   CARE MANAGEMENT NOTE 11/15/2011  Patient:  Chris [name], Chris [name]   Account Number:  [id number]  Date Initiated:  11/15/2011  Documentation initiated by:  Onnie Boer  Subjective/Objective Assessment:   PT WAS ADMITTED WITH RESP FAILURE     Action/Plan:   PROGRESSION OF CARE AND DISCHARGE PLANNING   Anticipated DC Date:  11/17/2011   Anticipated DC Plan:  [facility]  In-house referral  Clinical Social Worker      DC Planning Services  CM consult      Choice offered to / List presented to:             Status of service:  In process, will continue to follow Medicare Important Message given?   (If response is "NO", the following Medicare IM given date fields will be blank) Date Medicare IM given:   Date Additional Medicare IM given:    Discharge Disposition:    Per UR Regulation:  Reviewed for med. necessity/level of care/duration of stay  If discussed at Long Length of Stay Meetings, dates discussed:    Comments:  11/15/11 Onnie Boer, RN, BSN 1230 PT WAS ADMITTED WITH ENCEPHALOPATHY AND RESP FAILURE.  DC PLAN IS FOR HOME WITH HH AND HOSPICE VS. RESIDENTIAL HOSPICE.  WILL F/U.

## 2011-11-15 NOTE — Clinical Social Work Note (Signed)
CSW received 'other' consult, and after review of patient clinical information, pt has had a Palliative Care consult and decisions had been made regarding discharge disposition and EOL care. Patient desires to live with his sister in [place] and receive pain management from a  in that pain clinic in that area. Sister is willing for patient to live with her and pain clinic will accept Chris [name] back as a patient as he has rec'd services from them in the past. CSW signing off, however if clinical social work services needed prior to discharge, please reconsult.  Genelle Bal, MSW, LCSW [phone]

## 2011-11-15 NOTE — Progress Notes (Signed)
This RN noted while changing dressing on buttocks, found a shortened drinking straw. When patient asked if he was using this to snort pills, he denied. This RN provided opportunities for patient to admit to the use of the shortened straw and explain, but he continued to deny any unusual use of the straw. Dressing changed, linen changed, and patient comfortable without complaints. Ayesha Rumpf RN

## 2011-11-15 NOTE — Progress Notes (Signed)
Progress Note from the Palliative Medicine Team at [facility]  Subjective:pt alert and oriented, continued conversation related to GOC and disposition options    Objective: Allergies  Allergen Reactions  . Ibuprofen   . Naproxen   . Toradol    Scheduled Meds:   . albuterol  2.5 mg Nebulization TID  . carisoprodol  350 mg Oral QID  . Chlorhexidine Gluconate Cloth  6 each Topical Q0600  . dextromethorphan  30 mg Oral BID  . ipratropium  0.5 mg Nebulization TID  . linezolid  600 mg Intravenous Q12H  . LORazepam  1 mg Oral QHS  . oxyCODONE  30 mg Oral Q12H  . pantoprazole  40 mg Oral Q1200  . PHENObarbital  100 mg Intravenous BID  . potassium chloride  10 mEq Intravenous Q1 Hr x 4  . potassium chloride  40 mEq Oral TID  . DISCONTD: albuterol  2.5 mg Nebulization Q6H  . DISCONTD: ipratropium  0.5 mg Nebulization Q6H   Continuous Infusions:   . sodium chloride 20 mL/hr (11/14/11 1029)  . morphine 5 mg/hr (11/14/11 2020)   PRN Meds:.acetaminophen, acetaminophen, albuterol, morphine injection, ondansetron (ZOFRAN) IV, oxycodone, sodium chloride  BP 116/59  Pulse 87  Temp(Src) 98.5 F (36.9 C) (Oral)  Resp 18  Ht 6\' 1"  (1.854 m)  Wt 76.4 kg (168 lb 6.9 oz)  BMI 22.22 kg/m2  SpO2 95%   PPS:30%  Pain Score: continues to score at 8/10 continuously regardless of medications tried                                    Location: back and abdomen   Intake/Output Summary (Last 24 hours) at 11/15/11 1534 Last data filed at 11/15/11 1430  Gross per 24 hour  Intake    240 ml  Output   4050 ml  Net  -3810 ml      WUJ:[id number]    Stool Softner:none  Physical Exam:  General: chronically ill appearing HEENT:  moist mucous membranes Chest:   CTA CVS: RRR Abdomen:soft +BS with colsotomy Ext: B BKA Neuro:alert and oriented X2  Labs: CBC    Component Value Date/Time   WBC 4.3 11/15/2011 0505   RBC 3.32* 11/15/2011 0505   HGB 8.5* 11/15/2011 0505   HCT 26.5* 11/15/2011 0505     PLT 362 11/15/2011 0505   MCV 79.8 11/15/2011 0505   MCH 25.6* 11/15/2011 0505   MCHC 32.1 11/15/2011 0505   RDW 16.8* 11/15/2011 0505   LYMPHSABS 0.3* 11/12/2011 0900   MONOABS 0.3 11/12/2011 0900   EOSABS 0.2 11/12/2011 0900   BASOSABS 0.0 11/12/2011 0900    BMET    Component Value Date/Time   NA 138 11/15/2011 0505   K 2.5* 11/15/2011 0505   CL 95* 11/15/2011 0505   CO2 34* 11/15/2011 0505   GLUCOSE 107* 11/15/2011 0505   BUN <3* 11/15/2011 0505   CREATININE 0.43* 11/15/2011 0505   CALCIUM 7.8* 11/15/2011 0505   GFRNONAA >90 11/15/2011 0505   GFRAA >90 11/15/2011 0505    CMP     Component Value Date/Time   NA 138 11/15/2011 0505   K 2.5* 11/15/2011 0505   CL 95* 11/15/2011 0505   CO2 34* 11/15/2011 0505   GLUCOSE 107* 11/15/2011 0505   BUN <3* 11/15/2011 0505   CREATININE 0.43* 11/15/2011 0505   CALCIUM 7.8* 11/15/2011 0505   PROT 5.5* 11/11/2011 0430  ALBUMIN 1.6* 11/11/2011 0430   AST 39* 11/11/2011 0430   ALT 86* 11/11/2011 0430   ALKPHOS 649* 11/11/2011 0430   BILITOT 0.4 11/11/2011 0430   GFRNONAA >90 11/15/2011 0505   GFRAA >90 11/15/2011 0505     Assessment and Plan: 1. Code Status:DNR/ DNI    Pt outlines his currents goals for care as pain management and to be able to go home to live with his sister Ambrose Mantle in [place], [place] with Home Health services. Pt states he is no longer in the mind-set of end of life care and is no longer desiring hospice. Called sister by phone, she is agreeable to this plan.  2. Symptom Control: This is complicated situation and I believe this is more about addiction(emotional and total pain) than physical pain      -as noted pills were found in bed that appears to be partly dissolved, later nurse found short straw. Pt      denies abusing meds but per sister has long history and has been known to crush and snort prescribed      meds.  Patient reported that he was a patient of a pain clinic in [place], I called and received information from  clinic [facility]  Dr. Remigio Eisenmenger  [address] [place], [place] [address]  [phone]   As noted last medication regime was oxycontin 80 mg tid, oxycodone ER 30 MG BID xanax 1 mg bid and gralise 600 mg daily.  Discussed with patient plan to stop present medication regime and begin on what worked for him in the past.  The patient agrees to this plan.  I will start patient on   Oxycodone IR 20 mg every 4 hrs for breakthrough Oxycodone  ER 80 mg TID Xanax 1mg  TID  Disposition:  Sister is still agreeable to take patient to her home in [place].  Plan is home with sister to [place], secure appointment with pain clinic (listed above), with home health for wound care and other health needs.  Transport may be an issue.  Case manager aware and Kennieth Francois LCSWA with PMT involved (see her note)  Time In Time Out Total Time Spent with Patient Total Overall Time  1530  1700 70 90    Greater than 50%  of this time was spent counseling and coordinating care related to the above assessment and plan.  Dr Gonzella Lex aware of above  Lorinda Creed NP 1
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
## 2011-11-15 NOTE — Progress Notes (Signed)
11/15/2011 Palliative Medicine Team SW 3:59 PM Follow up visit with pt, along with with PMT NP Lorinda Creed and RN Ayesha Rumpf. Pt reports change in direction of care after speaking with his sister and son who he states don't want him "to give up". Pt outlines his currents goals for care as pain management and to be able to go home to live with his sister Ambrose Mantle in [place], [place] with Home Health services. Pt states he is no longer in the mind-set of end of life care and is no longer desiring hospice. Called sister by phone, she is agreeable to this plan.  Extensive discussion was had about pt's prior pain control regimen and he reports he used to be involved with [facility] in [place]. PMT NP spoke with the pain clinic which confirms his prior enrollment, last visit in  May 2012, and their willingness to take him on again as a pt.   Toward the end of our discussion PMT NP discovered 6 wet and slightly dissolved white tablets at the foot of pt's bed. Pt denies attempts to stash the meds, and RN reports she watched him take the dose administered this am.  Per sister's prior report to PMT NP, pt has history of crushing and snorting pill for stronger effect.   Reviewed all of this with Dr. Gonzella Lex and RN/CM Onnie Boer. Current plan is to look into setting up appointment with pain clinic, home health services and what options are available for transport.   Later, as I was writing this note, RN Corrie Dandy reports she found a shortened drinking straw stuck in pt's wound dressing as she was changing his bed and wound dressing. When asked, pt denied using straw for snorting pills.   FYI:  [facility] Dr. Remigio Eisenmenger [address] [place], [place] [address] [phone]   Kennieth Francois, [place] Pager [phone]

## 2011-11-16 LAB — BASIC METABOLIC PANEL
BUN: <3 mg/dL — ABNORMAL LOW (ref 6–23)
Calcium: 7.6 mg/dL — ABNORMAL LOW (ref 8.4–10.5)
Creatinine, Ser: 0.41 mg/dL — ABNORMAL LOW (ref 0.50–1.35)
GFR calc non Af Amer: >90 mL/min (ref >90)
Glucose, Bld: 111 mg/dL — ABNORMAL HIGH (ref 70–99)

## 2011-11-16 MED ORDER — IPRATROPIUM BROMIDE 0.02 % IN SOLN
RESPIRATORY_TRACT | Status: AC
  Administered 2011-11-16: 0.5 mg via RESPIRATORY_TRACT
  Filled 2011-11-16: qty 2.5

## 2011-11-16 MED ORDER — POTASSIUM CHLORIDE CRYS ER 20 MEQ PO TBCR
40.0000 meq | EXTENDED_RELEASE_TABLET | Freq: Three times a day (TID) | ORAL | Status: DC
  Administered 2011-11-16: 40 meq via ORAL
  Filled 2011-11-16 (×3): qty 2

## 2011-11-16 MED ORDER — MAGNESIUM SULFATE 40 MG/ML IJ SOLN
2.0000 g | Freq: Once | INTRAMUSCULAR | Status: AC
  Administered 2011-11-16: 2 g via INTRAVENOUS
  Filled 2011-11-16: qty 50

## 2011-11-16 MED ORDER — POTASSIUM CHLORIDE 10 MEQ/100ML IV SOLN
10.0000 meq | INTRAVENOUS | Status: AC
  Administered 2011-11-16 (×4): 10 meq via INTRAVENOUS
  Filled 2011-11-16 (×4): qty 100

## 2011-11-16 MED ORDER — MENTHOL 3 MG MT LOZG
1.0000 | LOZENGE | OROMUCOSAL | Status: DC | PRN
Start: 2011-11-16 — End: 2011-11-20
  Filled 2011-11-16 (×2): qty 9
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
## 2011-11-16 NOTE — Progress Notes (Signed)
11/16/2011 Palliative Medicine Team SW 12:34 PM Follow up visit with pt. RN reports pt complaining about medication regimen. Spoke with pt and assured him that he has been placed on the regimen that he told us about yesterday, which we confirmed with his pain clinic in [place]; this seem to calm his anxiety.   Pt refusing mattress recommended by wound care; states he is more comfortable on his current one.   Discussed pt's mood and discouragement since he's been in the hospital. Pt confirms his continue interest in aggressive measures. Working with [facility] on getting him settled at home with his sister in [place]. Pt asked that if possible, he'd like to have a nurse April, that he'd worked with before under Home Health.   Encouraged pt to maintain a positive outlook about all that he has to look forward to at home, ie, reunion with his sister and grandchildren.   Will continue to follow with this complex situation.   Chris [name], [place] Pager [phone]

## 2011-11-16 NOTE — Progress Notes (Signed)
   CARE MANAGEMENT NOTE 11/16/2011  Patient:  KYNDAL, GLOSTER   Account Number:  [id number]  Date Initiated:  11/15/2011  Documentation initiated by:  Onnie Boer  Subjective/Objective Assessment:   PT WAS ADMITTED WITH RESP FAILURE     Action/Plan:   PROGRESSION OF CARE AND DISCHARGE PLANNING   Anticipated DC Date:  11/17/2011   Anticipated DC Plan:  [facility]  In-house referral  Clinical Social Worker      DC Planning Services  CM consult      Choice offered to / List presented to:             Status of service:  In process, will continue to follow Medicare Important Message given?   (If response is "NO", the following Medicare IM given date fields will be blank) Date Medicare IM given:   Date Additional Medicare IM given:    Discharge Disposition:    Per UR Regulation:  Reviewed for med. necessity/level of care/duration of stay  If discussed at Long Length of Stay Meetings, dates discussed:    Comments:  11/16/11 Onnie Boer, RN, BSN 1650 SPOKE WITH PT AND HE HAS STATED THAT HE WAS GOING TO STAY WITH HIS SISTER CHERYL AT [place].  I HAVE CALLED HER AND SHE HAS STATED THAT HE HAS A BED WITH A NON WORKING MATRESS WITH [facility].  [facility] STATED THE CAN'T GET A NEW MATRESS UNLESS HE BUYS A NEW ONE OR RENT ONE.  PT SISTER STATED THAT SHE WILL CHECK ON IT TO SEE IF SHE CAN PATCH IT UP AND GET IT TO WORK.  HIS SISTER STATED THAT IT WILL BE VERY HARD TO GET SERVICES ON THE WEEKEND IN [facility].  SHE ALSO STATED THAT [facility] TRANSPORTED HIM BEFORE TO [place] AND THAT MEDICAID PAID FOR IT.  WILL CHECK WITH CSW ABOUT TRANSPORTATION AND THE ATTENDING ABOUT [facility] NEEDS TOMORROW. WILL F/U IN THE AM.  11/15/11 Onnie Boer, RN, BSN 1230 PT WAS ADMITTED WITH ENCEPHALOPATHY AND RESP FAILURE.  DC PLAN IS FOR HOME WITH HH AND HOSPICE VS. RESIDENTIAL HOSPICE.  WILL F/U.

## 2011-11-16 NOTE — Consult Note (Signed)
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  Followed by home health for dressing change assistance and usually has Dakins dressing Q day to assist with removal of nonviable tissue.  This therapy is only available on a short-term approval basis when at [facility]  Pt has discussed plan of care with Palliative  team. 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                         Left ischium 5X5X2cm, 90% yellow, 10% red, mod yellow drainage with strong odor. Dressing procedure/placement/frequency: Will use moist gauze dressings while inpatient, and home health can resume follow-up with previous plan of care after discharge. Pt states he has been having trouble with his air-mattress deflating when at home.  Recommend care management or social work to assist with obtaining new equipment, since decreasing pressure to wounds is very important to promote healing.   Will not plan to follow further unless re-consulted.  Mardee Postin, RN, MSN, [employer]  [phone]

## 2011-11-17 LAB — BASIC METABOLIC PANEL
CO2: 29 mEq/L (ref 19–32)
Calcium: 7.9 mg/dL — ABNORMAL LOW (ref 8.4–10.5)
Creatinine, Ser: 0.4 mg/dL — ABNORMAL LOW (ref 0.50–1.35)
Glucose, Bld: 93 mg/dL (ref 70–99)

## 2011-11-17 MED ORDER — DOXYCYCLINE HYCLATE 100 MG PO TABS: 100.0000 mg | ORAL_TABLET | Freq: Two times a day (BID) | ORAL | Status: DC

## 2011-11-17 NOTE — Progress Notes (Signed)
   CARE MANAGEMENT NOTE 11/17/2011  Patient:  Chris [name], Chris [name]   Account Number:  [id number]  Date Initiated:  11/15/2011  Documentation initiated by:  Onnie Boer  Subjective/Objective Assessment:   PT WAS ADMITTED WITH RESP FAILURE     Action/Plan:   PROGRESSION OF CARE AND DISCHARGE PLANNING   Anticipated DC Date:  11/17/2011   Anticipated DC Plan:  [facility]  In-house referral  Clinical Social Worker      DC Planning Services  CM consult      Choice offered to / List presented to:             Status of service:  In process, will continue to follow Medicare Important Message given?   (If response is "NO", the following Medicare IM given date fields will be blank) Date Medicare IM given:   Date Additional Medicare IM given:    Discharge Disposition:    Per UR Regulation:  Reviewed for med. necessity/level of care/duration of stay  If discussed at Long Length of Stay Meetings, dates discussed:    Comments:  11/17/11 Onnie Boer, RN, BSN 1648 PT IS NOT STABLE FOR TRANSFER TODAY D/T DECREASED O2 SATS. PT HAS BEEN SET UP WITH THE PAIN CLINIC IN [place] WITH AN APPT ON MAY 1OTH AT 940 AM.  I WILL FAX ALL INFORMATION AT TIME OF DC.  PT HAS ALSO BEEN [facility] TO [facility] WITH HH RN AND SW [facility], ALL INFORMATION WILL BE SET UP AT DC.  WE ARE TRYING TO ARRANGE TRANSPORT TO HIS SISTERS HOME IN [place].  CSW IS TRYING TO WORK OUT DETAILS.  IF THE COST IS NOT COVERED PT'S SISTER HAS BEEN INFORMED THAT SHE MAY NEED TO COVER THE COST.  WILL F/U ON MONDAY.  11/16/11 Onnie Boer, RN, BSN 1650 SPOKE WITH PT AND HE HAS STATED THAT HE WAS GOING TO STAY WITH HIS SISTER CHERYL AT [place].  I HAVE CALLED HER AND SHE HAS STATED THAT HE HAS A BED WITH A NON WORKING MATRESS WITH [facility].  [facility] STATED THE CAN'T GET A NEW MATRESS UNLESS HE BUYS A NEW ONE OR RENT ONE.  PT SISTER STATED THAT SHE WILL CHECK ON IT TO SEE IF SHE CAN PATCH IT UP AND GET IT  TO WORK.  HIS SISTER STATED THAT IT WILL BE VERY HARD TO GET SERVICES ON THE WEEKEND IN [facility].  SHE ALSO STATED THAT [facility] TRANSPORTED HIM BEFORE TO [place] AND THAT MEDICAID PAID FOR IT.  WILL CHECK WITH CSW ABOUT TRANSPORTATION AND THE ATTENDING ABOUT ABX NEEDS TOMORROW. WILL F/U IN THE AM.  11/15/11 Onnie Boer, RN, BSN 1230 PT WAS ADMITTED WITH ENCEPHALOPATHY AND RESP FAILURE.  DC PLAN IS FOR HOME WITH HH AND HOSPICE VS. RESIDENTIAL HOSPICE.  WILL F/U.

## 2011-11-17 NOTE — Progress Notes (Addendum)
Clinical Social Work Department BRIEF PSYCHOSOCIAL ASSESSMENT 11/17/2011  Patient:  Chris [name], Chris [name]     Account Number:  [id number]     Admit date:  11/09/2011  Clinical Social Worker:  Hulan Fray  Date/Time:  11/17/2011 01:53 PM  Referred by:  Physician  Date Referred:  11/16/2011 Referred for  Other - See comment   Other Referral:   Transportation needs   Interview type:  Other - See comment Other interview type:   CM    PSYCHOSOCIAL DATA Living Status:  ALONE Admitted from facility:   Level of care:   Primary support name:  Chris [name] and Cheryl Primary support relationship to patient:  FAMILY Degree of support available:   adequate    CURRENT CONCERNS Current Concerns  Other - See comment   Other Concerns:   transportation issues    SOCIAL WORK ASSESSMENT / PLAN Clinical Social Worker spoke with CM regarding need for transportation to [place], [place] where his sister lives. Discharge plans are for home health services. CSW called [facility] and spoke with Olegario Messier [phone]). CSW will followup with Olegario Messier when patient is ready for discharge Monday, if he remains stable. CSW will continue to follow.  14:22pm CSW received call from [place] at [place] and she spoke to her Interior and spatial designer and stated that they cannot transport across [employer].    Assessment/plan status:  Psychosocial Support/Ongoing Assessment of Needs Other assessment/ plan:   Information/referral to community resources:    PATIENT'S/FAMILY'S RESPONSE TO PLAN OF CARE: Per chart review, patient is preferring to stay with his sister in [place], [place].
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
## 2011-11-17 NOTE — Progress Notes (Signed)
11/17/2011 Palliative Medicine Team SW 2:28 PM Follow up psychosocial visit. Pt c/o of new onset of "sharp stabbing chest pain" which resolved by end of brief conversation--did alert RN however. Pt expressed anxiety about being discharged too soon; assuaged fears with team's plan for Monday d/c to which he is agreeable. Continue to encourage positive thinking with pt as he tends to have a pessimistic outlook at times and is very much accustomed to a "sick role".  RNCM and CSW managing d/c, Will follow up Monday.   Kennieth Francois, [place] Pager [phone]

## 2011-11-18 MED ORDER — OXYCODONE HCL 40 MG PO TB12
80.0000 mg | ORAL_TABLET | Freq: Three times a day (TID) | ORAL | Status: DC
  Administered 2011-11-18 – 2011-11-20 (×5): 80 mg via ORAL
  Filled 2011-11-18 (×7): qty 2
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
## 2011-11-18 NOTE — Progress Notes (Signed)
Progress Note from the Palliative Medicine Team at [facility]  Subjective:pt alert and oriented, stating  "Im tired I didn't sleep well last night"     Objective: Allergies  Allergen Reactions  . Ibuprofen   . Naproxen   . Toradol    Scheduled Meds:   . albuterol  2.5 mg Nebulization TID  . ALPRAZolam  1 mg Oral TID  . carisoprodol  350 mg Oral QID  . dextromethorphan  30 mg Oral BID  . ipratropium  0.5 mg Nebulization TID  . linezolid  600 mg Intravenous Q12H  . oxyCODONE  80 mg Oral TID  . oxyCODONE  80 mg Oral TID  . pantoprazole  40 mg Oral Q1200  . DISCONTD: doxycycline  100 mg Oral Q12H  . DISCONTD: potassium chloride  40 mEq Oral TID   Continuous Infusions:   . sodium chloride 20 mL/hr at 11/17/11 1221   PRN Meds:.acetaminophen, acetaminophen, albuterol, menthol-cetylpyridinium, ondansetron (ZOFRAN) IV, oxyCODONE, sodium chloride  BP 126/75  Pulse 87  Temp(Src) 98.3 F (36.8 C) (Oral)  Resp 18  Ht 6\' 1"  (1.854 m)  Wt 76.4 kg (168 lb 6.9 oz)  BMI 22.22 kg/m2  SpO2 92%   PPS:30%  Pain Score:"tolerable"   Pain Location  Lower back   Intake/Output Summary (Last 24 hours) at 11/18/11 1219 Last data filed at 11/18/11 0500  Gross per 24 hour  Intake    340 ml  Output   5200 ml  Net  -4860 ml      ZOX:[id number]      Physical Exam:  General: chronically ill appearing, NAD HEENT:  unremarkable Chest:   Scattered RH, clears with cough CVS: RRR Abdomen:soft, noted stoma, +BS Ext: B AKA Neuro:alert and oriented  Labs: CBC    Component Value Date/Time   WBC 4.3 11/15/2011 0505   RBC 3.32* 11/15/2011 0505   HGB 8.5* 11/15/2011 0505   HCT 26.5* 11/15/2011 0505   PLT 362 11/15/2011 0505   MCV 79.8 11/15/2011 0505   MCH 25.6* 11/15/2011 0505   MCHC 32.1 11/15/2011 0505   RDW 16.8* 11/15/2011 0505   LYMPHSABS 0.3* 11/12/2011 0900   MONOABS 0.3 11/12/2011 0900   EOSABS 0.2 11/12/2011 0900   BASOSABS 0.0 11/12/2011 0900    BMET    Component Value Date/Time    NA 137 11/17/2011 0500   K 3.3* 11/17/2011 0500   CL 99 11/17/2011 0500   CO2 29 11/17/2011 0500   GLUCOSE 93 11/17/2011 0500   BUN 3* 11/17/2011 0500   CREATININE 0.40* 11/17/2011 0500   CALCIUM 7.9* 11/17/2011 0500   GFRNONAA >90 11/17/2011 0500   GFRAA >90 11/17/2011 0500    CMP     Component Value Date/Time   NA 137 11/17/2011 0500   K 3.3* 11/17/2011 0500   CL 99 11/17/2011 0500   CO2 29 11/17/2011 0500   GLUCOSE 93 11/17/2011 0500   BUN 3* 11/17/2011 0500   CREATININE 0.40* 11/17/2011 0500   CALCIUM 7.9* 11/17/2011 0500   PROT 5.5* 11/11/2011 0430   ALBUMIN 1.6* 11/11/2011 0430   AST 39* 11/11/2011 0430   ALT 86* 11/11/2011 0430   ALKPHOS 649* 11/11/2011 0430   BILITOT 0.4 11/11/2011 0430   GFRNONAA >90 11/17/2011 0500   GFRAA >90 11/17/2011 0500      Assessment and Plan: 1. Code Status:DNR/DNI 2. Symptom Control:managed, no further adjustments at this time, waiting on disposition and pt to fu at pain clinic in his area  Addiction issues noted 3. Psycho/Social:sat at bedside , patient verbalizes he is ready to dc to his sisters house, no concerns voiced 4. Disposition: to dc on Monday to sisters house in [place] with HH     Time In Time Out Total Time Spent with Patient Total Overall Time  1200 1220 20 20    Greater than 50%  of this time was spent counseling and coordinating care related to the above assessment and plan.  Lorinda Creed NP [phone]   1
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
## 2011-11-20 DIAGNOSIS — Z978 Presence of other specified devices: Secondary | ICD-10-CM

## 2011-11-20 DIAGNOSIS — Z89611 Acquired absence of right leg above knee: Secondary | ICD-10-CM

## 2011-11-20 DIAGNOSIS — A491 Streptococcal infection, unspecified site: Secondary | ICD-10-CM | POA: Diagnosis present

## 2011-11-20 DIAGNOSIS — R7881 Bacteremia: Secondary | ICD-10-CM | POA: Diagnosis present

## 2011-11-20 DIAGNOSIS — Z89511 Acquired absence of right leg below knee: Secondary | ICD-10-CM

## 2011-11-20 DIAGNOSIS — Z933 Colostomy status: Secondary | ICD-10-CM

## 2011-11-20 DIAGNOSIS — E876 Hypokalemia: Secondary | ICD-10-CM | POA: Diagnosis not present

## 2011-11-20 HISTORY — DX: Presence of other specified devices: Z97.8

## 2011-11-20 HISTORY — DX: Acquired absence of left leg below knee: Z89.511

## 2011-11-20 MED ORDER — PANTOPRAZOLE SODIUM 40 MG PO TBEC: 40.0000 mg | DELAYED_RELEASE_TABLET | Freq: Every day | ORAL | Status: DC

## 2011-11-20 MED ORDER — OXYCODONE HCL 80 MG PO TB12: 80.0000 mg | ORAL_TABLET | Freq: Three times a day (TID) | ORAL | Status: DC

## 2011-11-20 MED ORDER — OXYCODONE HCL 20 MG PO TABS: 20.0000 mg | ORAL_TABLET | ORAL | Status: AC | PRN

## 2011-11-20 MED ORDER — ALBUTEROL SULFATE HFA 108 (90 BASE) MCG/ACT IN AERS: 2.0000 | INHALATION_SPRAY | Freq: Four times a day (QID) | RESPIRATORY_TRACT | Status: DC | PRN

## 2011-11-20 MED ORDER — DOCUSATE SODIUM 100 MG PO CAPS: 100.0000 mg | ORAL_CAPSULE | Freq: Every day | ORAL | Status: AC | PRN

## 2011-11-20 MED ORDER — ALPRAZOLAM 1 MG PO TABS: 1.0000 mg | ORAL_TABLET | Freq: Three times a day (TID) | ORAL | Status: AC

## 2011-11-20 MED ORDER — MENTHOL 3 MG MT LOZG: 1.0000 | LOZENGE | OROMUCOSAL | Status: DC | PRN

## 2011-11-20 MED ORDER — DEXTROMETHORPHAN POLISTIREX 30 MG/5ML PO LQCR: 30.0000 mg | Freq: Two times a day (BID) | ORAL | Status: AC

## 2011-11-20 NOTE — Progress Notes (Addendum)
11/20/2011 Palliative Medicine Team SW 11:43 AM Farewell visit with pt. CSW & RNCM setting up final details for transport/dc. Pt in good spirits, much more talkative today. 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 We discussed more ideas about legacy-making; pt would like to teach his nephew about auto mechanics before dying. Affirmed pt's goal setting and positive outlook. Pt thanked me for the supportive visits over his hospital stay. No further needs at this time.   Chris [name], [place] Pager [phone]

## 2011-11-20 NOTE — Progress Notes (Signed)
CARE MANAGEMENT NOTE 11/20/2011  Patient:  Chris [name], Chris [name]   Account Number:  [id number]  Date Initiated:  11/15/2011  Documentation initiated by:  Chris [name]  Subjective/Objective Assessment:   PT WAS ADMITTED WITH RESP FAILURE     Action/Plan:   PROGRESSION OF CARE AND DISCHARGE PLANNING   Anticipated DC Date:  11/17/2011   Anticipated DC Plan:  HOSPICE MEDICAL FACILITY  In-house referral  Clinical Social Worker      DC Planning Services  CM consult      Choice offered to / List presented to:          [facility] arranged  HH-6 SOCIAL WORKER  HH-1 RN      Status of service:  Completed, signed off Medicare Important Message given?   (If response is "NO", the following Medicare IM given date fields will be blank) Date Medicare IM given:   Date Additional Medicare IM given:    Discharge Disposition:  HOME W HOME HEALTH SERVICES  Per UR Regulation:  Reviewed for med. necessity/level of care/duration of stay  If discussed at Long Length of Stay Meetings, dates discussed:    Comments:  11/20/11 Chris Boer, RN, BSN 1547 PT HAS BEEN DC'D TO HOME WITH  HIS SISTER CHERYL IN [place], [place].  HE WILL HAVE A F/U APPT AT THE [facility] ON MAY 10TH AT 0940, PCP F/U WITH DR. Francee Piccolo ON MAY 13TH AT 11 AM, A HH RN AND SW WITH [facility].  PT WAS SENT TO [place] VIA [facility]SPORT[facility] AT THE EXPENSE OF [facility].  CSW HAS GOTTEN APPROVAL FROM THEIR MANAGER. PT HAS A BED AND AIR MATRESS FROM [facility].  SISTER WAS INTERESTED IN A NEW MATRESS, MEDICAID WILL NOT APPROVE AS HE HAS NOT HAD IT 5 YRS, SISTER AWARE AND WILL FIGURE OUT WHAT SHE CAN DO.  SHE IS JUST HAPPY TO HAVE HIM AT HOME WITH HIS FAMILY.   11/17/11 Chris Boer, RN, BSN 1648 PT IS NOT STABLE FOR TRANSFER TODAY D/T DECREASED O2 SATS. PT HAS BEEN SET UP WITH THE PAIN CLINIC IN [facility] WITH AN APPT ON MAY 1OTH AT 940 AM.  I WILL FAX ALL INFORMATION AT TIME OF DC.  PT HAS ALSO BEEN SET UP WITH THE  [facility] TO RETURN WITH HH RN AND SW SO FAR, ALL INFORMATION WILL BE SET UP AT DC.  WE ARE TRYING TO ARRANGE TRANSPORT TO HIS SISTERS HOME IN [place] .  CSW IS TRYING TO WORK OUT DETAILS.  IF THE COST IS NOT COVERED PT'S SISTER HAS BEEN INFORMED THAT SHE MAY NEED TO COVER THE COST.  WILL F/U ON MONDAY.  11/16/11 Chris Boer, RN, BSN 1650 SPOKE WITH PT AND HE HAS STATED THAT HE WAS GOING TO STAY WITH HIS SISTER CHERYL AT DC.  I HAVE CALLED HER AND SHE HAS STATED THAT HE HAS A BED WITH A NON WORKING MATRESS WITH [facility].  [facility] STATED THE CAN'T GET A NEW MATRESS UNLESS HE BUYS A NEW ONE OR RENT ONE.  PT SISTER STATED THAT SHE WILL CHECK ON IT TO SEE IF SHE CAN PATCH IT UP AND GET IT TO WORK.  HIS SISTER STATED THAT IT WILL BE VERY HARD TO GET SERVICES ON THE WEEKEND IN [facility].  SHE ALSO STATED THAT [facility] TRANSPORTED HIM BEFORE TO [place] AND THAT MEDICAID PAID FOR IT.  WILL CHECK WITH CSW ABOUT TRANSPORTATION AND THE ATTENDING ABOUT [facility] NEEDS TOMORROW. WILL F/U  IN THE AM.  11/15/11 Chris Boer, RN, BSN 1230 PT WAS ADMITTED WITH ENCEPHALOPATHY AND RESP FAILURE.  DC PLAN IS FOR HOME WITH HH AND HOSPICE VS. RESIDENTIAL HOSPICE.  WILL F/U.

## 2011-11-20 NOTE — Progress Notes (Signed)
Clinical Social Worker spoke with RN, RNCM, and MD regarding pt's transfer to sister's residence.  CSW confirmed with RN that pt is ready for dc.  CSW arranged transport through [facility].  CSW to sign off at this time.  Please re consult if needed.   Angelia Mould, MSW, [place] [phone]
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
## 2012-08-25 ENCOUNTER — Encounter (HOSPITAL_COMMUNITY): Payer: Self-pay | Admitting: Emergency Medicine

## 2012-08-25 ENCOUNTER — Inpatient Hospital Stay (HOSPITAL_COMMUNITY)
Admission: EM | Admit: 2012-08-25 | Discharge: 2012-09-04 | DRG: 814 | Disposition: A | Discharge status: Home / Self Care | Payer: Medicaid Other | Attending: General Surgery | Admitting: General Surgery

## 2012-08-25 ENCOUNTER — Emergency Department (HOSPITAL_COMMUNITY): Payer: Medicaid Other

## 2012-08-25 DIAGNOSIS — R319 Hematuria, unspecified: Secondary | ICD-10-CM

## 2012-08-25 DIAGNOSIS — F17200 Nicotine dependence, unspecified, uncomplicated: Secondary | ICD-10-CM | POA: Diagnosis present

## 2012-08-25 DIAGNOSIS — Z933 Colostomy status: Secondary | ICD-10-CM

## 2012-08-25 DIAGNOSIS — Z79899 Other long term (current) drug therapy: Secondary | ICD-10-CM

## 2012-08-25 DIAGNOSIS — S36039A Unspecified laceration of spleen, initial encounter: Secondary | ICD-10-CM

## 2012-08-25 DIAGNOSIS — Z66 Do not resuscitate: Secondary | ICD-10-CM | POA: Diagnosis present

## 2012-08-25 DIAGNOSIS — D649 Anemia, unspecified: Secondary | ICD-10-CM | POA: Diagnosis present

## 2012-08-25 DIAGNOSIS — Y92009 Unspecified place in unspecified non-institutional (private) residence as the place of occurrence of the external cause: Secondary | ICD-10-CM

## 2012-08-25 DIAGNOSIS — Y93E9 Activity, other interior property and clothing maintenance: Secondary | ICD-10-CM

## 2012-08-25 DIAGNOSIS — S78119A Complete traumatic amputation at level between unspecified hip and knee, initial encounter: Secondary | ICD-10-CM

## 2012-08-25 DIAGNOSIS — F3289 Other specified depressive episodes: Secondary | ICD-10-CM | POA: Diagnosis present

## 2012-08-25 DIAGNOSIS — Y846 Urinary catheterization as the cause of abnormal reaction of the patient, or of later complication, without mention of misadventure at the time of the procedure: Secondary | ICD-10-CM | POA: Diagnosis not present

## 2012-08-25 DIAGNOSIS — E871 Hypo-osmolality and hyponatremia: Secondary | ICD-10-CM | POA: Diagnosis not present

## 2012-08-25 DIAGNOSIS — Y998 Other external cause status: Secondary | ICD-10-CM

## 2012-08-25 DIAGNOSIS — F329 Major depressive disorder, single episode, unspecified: Secondary | ICD-10-CM | POA: Diagnosis present

## 2012-08-25 DIAGNOSIS — C8589 Other specified types of non-Hodgkin lymphoma, extranodal and solid organ sites: Secondary | ICD-10-CM | POA: Diagnosis present

## 2012-08-25 DIAGNOSIS — T83511A Infection and inflammatory reaction due to indwelling urethral catheter, initial encounter: Secondary | ICD-10-CM | POA: Diagnosis not present

## 2012-08-25 DIAGNOSIS — S40012A Contusion of left shoulder, initial encounter: Secondary | ICD-10-CM

## 2012-08-25 DIAGNOSIS — Z85038 Personal history of other malignant neoplasm of large intestine: Secondary | ICD-10-CM

## 2012-08-25 DIAGNOSIS — R599 Enlarged lymph nodes, unspecified: Secondary | ICD-10-CM | POA: Diagnosis present

## 2012-08-25 DIAGNOSIS — S20219A Contusion of unspecified front wall of thorax, initial encounter: Secondary | ICD-10-CM

## 2012-08-25 DIAGNOSIS — Z23 Encounter for immunization: Secondary | ICD-10-CM

## 2012-08-25 DIAGNOSIS — I2699 Other pulmonary embolism without acute cor pulmonale: Secondary | ICD-10-CM | POA: Diagnosis not present

## 2012-08-25 DIAGNOSIS — B377 Candidal sepsis: Secondary | ICD-10-CM | POA: Diagnosis not present

## 2012-08-25 DIAGNOSIS — S40019A Contusion of unspecified shoulder, initial encounter: Secondary | ICD-10-CM | POA: Diagnosis present

## 2012-08-25 DIAGNOSIS — G8929 Other chronic pain: Secondary | ICD-10-CM | POA: Diagnosis present

## 2012-08-25 DIAGNOSIS — S3609XA Other injury of spleen, initial encounter: Principal | ICD-10-CM | POA: Diagnosis present

## 2012-08-25 DIAGNOSIS — W108XXA Fall (on) (from) other stairs and steps, initial encounter: Secondary | ICD-10-CM | POA: Diagnosis present

## 2012-08-25 HISTORY — DX: Presence of other vascular implants and grafts: Z95.828

## 2012-08-25 LAB — CBC WITH DIFFERENTIAL/PLATELET
Basophils Absolute: 0 10*3/uL (ref 0.0–0.1)
Basophils Relative: 0 % (ref 0–1)
HCT: 25.7 % — ABNORMAL LOW (ref 39.0–52.0)
Hemoglobin: 8.8 g/dL — ABNORMAL LOW (ref 13.0–17.0)
Lymphocytes Relative: 8 % — ABNORMAL LOW (ref 12–46)
Monocytes Relative: 6 % (ref 3–12)
Neutro Abs: 4.3 10*3/uL (ref 1.7–7.7)
Neutrophils Relative %: 85 % — ABNORMAL HIGH (ref 43–77)
RDW: 13.9 % (ref 11.5–15.5)
WBC: 5.1 10*3/uL (ref 4.0–10.5)

## 2012-08-25 LAB — URINALYSIS, ROUTINE W REFLEX MICROSCOPIC
Glucose, UA: NEGATIVE mg/dL
Ketones, ur: NEGATIVE mg/dL
Leukocytes, UA: NEGATIVE
Protein, ur: 30 mg/dL — AB
pH: 5.5 (ref 5.0–8.0)

## 2012-08-25 LAB — URINE MICROSCOPIC-ADD ON

## 2012-08-25 LAB — COMPREHENSIVE METABOLIC PANEL
ALT: 36 U/L (ref 0–53)
Albumin: 2.7 g/dL — ABNORMAL LOW (ref 3.5–5.2)
Alkaline Phosphatase: 292 U/L — ABNORMAL HIGH (ref 39–117)
BUN: 8 mg/dL (ref 6–23)
Chloride: 91 mEq/L — ABNORMAL LOW (ref 96–112)
Potassium: 3.7 mEq/L (ref 3.5–5.1)
Sodium: 129 mEq/L — ABNORMAL LOW (ref 135–145)
Total Bilirubin: 0.5 mg/dL (ref 0.3–1.2)

## 2012-08-25 LAB — MRSA PCR SCREENING: MRSA by PCR: NEGATIVE

## 2012-08-25 MED ORDER — LACTATED RINGERS IV SOLN
INTRAVENOUS | Status: DC
  Administered 2012-08-25 – 2012-08-26 (×2): 1000 mL via INTRAVENOUS
  Administered 2012-08-26 – 2012-08-27 (×3): via INTRAVENOUS
  Administered 2012-08-29: 10 mL/h via INTRAVENOUS

## 2012-08-25 MED ORDER — PANTOPRAZOLE SODIUM 40 MG IV SOLR
40.0000 mg | INTRAVENOUS | Status: DC
  Administered 2012-08-25 – 2012-08-26 (×2): 40 mg via INTRAVENOUS
  Filled 2012-08-25 (×2): qty 40

## 2012-08-25 MED ORDER — NICOTINE 21 MG/24HR TD PT24
21.0000 mg | MEDICATED_PATCH | Freq: Every day | TRANSDERMAL | Status: DC
  Administered 2012-08-25 – 2012-09-04 (×10): 21 mg via TRANSDERMAL
  Filled 2012-08-25 (×11): qty 1

## 2012-08-25 MED ORDER — OXYCODONE HCL ER 20 MG PO T12A
80.0000 mg | EXTENDED_RELEASE_TABLET | Freq: Three times a day (TID) | ORAL | Status: DC
  Administered 2012-08-25 – 2012-09-04 (×29): 80 mg via ORAL
  Filled 2012-08-25 (×27): qty 4

## 2012-08-25 MED ORDER — ALPRAZOLAM 1 MG PO TABS
1.0000 mg | ORAL_TABLET | Freq: Three times a day (TID) | ORAL | Status: DC | PRN
  Administered 2012-08-25 – 2012-09-04 (×16): 1 mg via ORAL
  Filled 2012-08-25 (×2): qty 1
  Filled 2012-08-25 (×3): qty 2
  Filled 2012-08-25 (×2): qty 1
  Filled 2012-08-25: qty 2
  Filled 2012-08-25: qty 1
  Filled 2012-08-25 (×2): qty 2
  Filled 2012-08-25 (×4): qty 1
  Filled 2012-08-25: qty 2
  Filled 2012-08-25: qty 1
  Filled 2012-08-25: qty 2

## 2012-08-25 MED ORDER — HYDROMORPHONE HCL PF 1 MG/ML IJ SOLN
1.0000 mg | INTRAMUSCULAR | Status: DC | PRN
  Administered 2012-08-25 – 2012-09-04 (×36): 1 mg via INTRAVENOUS
  Filled 2012-08-25 (×38): qty 1

## 2012-08-25 MED ORDER — OXYCODONE HCL 5 MG PO TABS
30.0000 mg | ORAL_TABLET | ORAL | Status: DC | PRN
  Administered 2012-08-26 – 2012-09-04 (×27): 30 mg via ORAL
  Filled 2012-08-25 (×14): qty 6
  Filled 2012-08-25: qty 5
  Filled 2012-08-25 (×6): qty 6
  Filled 2012-08-25: qty 5
  Filled 2012-08-25 (×2): qty 6
  Filled 2012-08-25: qty 1
  Filled 2012-08-25 (×6): qty 6
  Filled 2012-08-25: qty 1

## 2012-08-25 MED ORDER — PANTOPRAZOLE SODIUM 40 MG PO TBEC: 40.0000 mg | DELAYED_RELEASE_TABLET | Freq: Every day | ORAL | Status: DC

## 2012-08-25 MED ORDER — DOCUSATE SODIUM 100 MG PO CAPS
100.0000 mg | ORAL_CAPSULE | Freq: Two times a day (BID) | ORAL | Status: DC
  Administered 2012-08-25 – 2012-09-01 (×14): 100 mg via ORAL
  Filled 2012-08-25 (×15): qty 1

## 2012-08-25 MED ORDER — DIPHENHYDRAMINE HCL 50 MG/ML IJ SOLN: 12.5000 mg | Freq: Four times a day (QID) | INTRAMUSCULAR | Status: DC | PRN

## 2012-08-25 MED ORDER — ONDANSETRON HCL 4 MG/2ML IJ SOLN
4.0000 mg | Freq: Four times a day (QID) | INTRAMUSCULAR | Status: DC | PRN
  Administered 2012-08-26 – 2012-08-29 (×2): 4 mg via INTRAVENOUS
  Filled 2012-08-25 (×2): qty 2

## 2012-08-25 MED ORDER — HYDROMORPHONE HCL PF 1 MG/ML IJ SOLN
1.0000 mg | Freq: Once | INTRAMUSCULAR | Status: AC
  Administered 2012-08-25: 1 mg via INTRAVENOUS
  Filled 2012-08-25: qty 1

## 2012-08-25 MED ORDER — LISINOPRIL 10 MG PO TABS
10.0000 mg | ORAL_TABLET | Freq: Every day | ORAL | Status: DC
  Administered 2012-08-25 – 2012-08-27 (×2): 10 mg via ORAL
  Filled 2012-08-25 (×2): qty 1

## 2012-08-25 MED ORDER — IOHEXOL 300 MG/ML  SOLN
100.0000 mL | Freq: Once | INTRAMUSCULAR | Status: AC | PRN
  Administered 2012-08-25: 100 mL via INTRAVENOUS

## 2012-08-25 MED ORDER — ALBUTEROL SULFATE HFA 108 (90 BASE) MCG/ACT IN AERS: 2.0000 | INHALATION_SPRAY | Freq: Four times a day (QID) | RESPIRATORY_TRACT | Status: DC | PRN

## 2012-08-25 MED ORDER — DIPHENHYDRAMINE HCL 12.5 MG/5ML PO ELIX: 12.5000 mg | ORAL_SOLUTION | Freq: Four times a day (QID) | ORAL | Status: DC | PRN

## 2012-08-25 NOTE — ED Notes (Signed)
Pt sleeping. 

## 2012-08-25 NOTE — ED Notes (Signed)
Dr. Jenkins in to see pt. 

## 2012-08-25 NOTE — ED Notes (Signed)
Pt's sats did not come up. 02 2l Naranja applied and now 98%

## 2012-08-25 NOTE — ED Notes (Signed)
Pt bilateral amputee. Larey Seat out of w/c last night. Denies hitting head/LOC. Pt c/o l shoulder/rib and arm pain. Nad. rom limited due to pain.
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
## 2012-08-25 NOTE — ED Notes (Signed)
In to advise pt will take him up to room around 1915 but pt sleeping. Nad.

## 2012-08-25 NOTE — ED Provider Notes (Signed)
History   This chart was scribed for Ward Givens, MD by Melba Coon, ED Scribe. The patient was seen in room APA18/APA18 and the patient's care was started at 3:28PM.    CSN: [id number]  Arrival date & time 08/25/12  1508   First MD Initiated Contact with Patient 08/25/12 1519      Chief Complaint  Patient presents with  . Fall    (Consider location/radiation/quality/duration/timing/severity/associated sxs/prior treatment) The history is provided by the patient. No language interpreter was used.   Chris [name] is a 57 y.o. male who presents to the Emergency Department complaining of constant, moderate left shoulder blade, left shoulder, left chest pain pertaining to a fall without head contact or LOC with an onset last night. He is a bilateral amputee. He reports he was gathering firewood on the porch outside when he fell out of his wheelchair off the front porch down 4 cement stairs all the way to the bottom to a cement slab. He states he still had the firewood in his lap.  He reports chest pan induced by deep inhalation, but denies SOB. He has a foley catheter but reports it had blood coming from it after the fall but the catheter was not displaced. Reports abdominal pain since the fall. Denies HA, fever, cough neck pain, sore throat, rash, back pain, nausea, emesis, diarrhea, dysuria, or extremity edema, weakness, numbness, or tingling. History of cholecystectomy. No known allergies. No other pertinent medical symptoms.  PCP: Dr Janna Arch  Past Medical History  Diagnosis Date  . Colon cancer   . Seizures   B cell lymphoma  Past Surgical History  Procedure Date  . Colostomy   cholecystectomy  History reviewed. No pertinent family history.  History  Substance Use Topics  . Smoking status: Current Some Day Smoker 1/2 ppd  . Smokeless tobacco: Not on file  . Alcohol Use: 1.5 oz/week    3 drink(s) per week     Comment: used to per pt.   He reports he smokes about a pack a  day.  Nurse reports on his chart that he drinks 3 times a week, but he reports he does not drink at all. He just moved from [place], [place]  2 days ago Pt uses wheelchair  Review of Systems 10 Systems reviewed and all are negative for acute change except as noted in the HPI.   Allergies  Ibuprofen; Ketorolac tromethamine; and Naproxen  Home Medications   Current Outpatient Rx  Name  Route  Sig  Dispense  Refill  . ALBUTEROL SULFATE HFA 108 (90 BASE) MCG/ACT IN AERS   Inhalation   Inhale 2 puffs into the lungs every 6 (six) hours as needed for wheezing.   1 Inhaler   2   . ALPRAZOLAM 1 MG PO TABS   Oral   Take 1 mg by mouth 3 (three) times daily.         Marland Kitchen CARISOPRODOL 350 MG PO TABS   Oral   Take 350 mg by mouth 4 (four) times daily.         Marland Kitchen LISINOPRIL 10 MG PO TABS   Oral   Take 10 mg by mouth daily.         . OXYCODONE HCL ER 80 MG PO TB12   Oral   Take 1 tablet (80 mg total) by mouth 3 (three) times daily.   30 tablet   0   . OXYCODONE HCL 30 MG PO TABS   Oral  Take 30 mg by mouth every 4 (four) hours as needed. pain         . PANTOPRAZOLE SODIUM 40 MG PO TBEC   Oral   Take 1 tablet (40 mg total) by mouth daily at 12 noon.   30 tablet   0     BP 115/71  Pulse 84  Temp 98.9 F (37.2 C) (Oral)  Resp 18  SpO2 98%  Vital signs normal    Physical Exam  Nursing note and vitals reviewed. Constitutional: He is oriented to person, place, and time. He appears well-developed and well-nourished.  Non-toxic appearance. He does not appear ill. No distress.  HENT:  Head: Normocephalic and atraumatic.  Right Ear: External ear normal.  Left Ear: External ear normal.  Nose: Nose normal. No mucosal edema or rhinorrhea.  Mouth/Throat: Oropharynx is clear and moist and mucous membranes are normal. No dental abscesses or uvula swelling.  Eyes: Conjunctivae normal and EOM are normal. Pupils are equal, round, and reactive to light.  Neck: Normal range of  motion and full passive range of motion without pain. Neck supple.  Cardiovascular: Normal rate, regular rhythm and normal heart sounds.  Exam reveals no gallop and no friction rub.   No murmur heard. Pulmonary/Chest: Effort normal and breath sounds normal. No respiratory distress. He has no wheezes. He has no rhonchi. He has no rales. He exhibits no tenderness and no crepitus.  Abdominal: Soft. Normal appearance and bowel sounds are normal. He exhibits no distension. There is tenderness. There is no rebound and no guarding.       Diffusely tender abdomen. Colostomy in the left mid abdomen  Genitourinary:       Bleeding present from foley catheter. He has a colostomy bag.  Musculoskeletal: Normal range of motion. He exhibits tenderness. He exhibits no edema.       Bilateral AKA. Left anterior rib tenderness about the mid clavicular line. Left shoulder is TTP without dislocation or crepitance. Left scapula is TTP without crepitance.States he is unable to move his left arm b/o pain in his left shoulder  Neurological: He is alert and oriented to person, place, and time. He has normal strength. No cranial nerve deficit.  Skin: Skin is warm, dry and intact. No rash noted. No erythema. No pallor.  Psychiatric: He has a normal mood and affect. His speech is normal and behavior is normal. His mood appears not anxious.    ED Course  Procedures (including critical care time).   Medications  HYDROmorphone (DILAUDID) injection 1 mg (1 mg Intravenous Given 08/25/12 1602)  iohexol (OMNIPAQUE) 300 MG/ML solution 100 mL (100 mL Intravenous Contrast Given 08/25/12 1623)     DIAGNOSTIC STUDIES: Oxygen Saturation is 98% on room air, normal by my interpretation.    COORDINATION OF CARE:  3:39PM - dilaudid, left shoulder XR, chest Ct with contrast, abdomen pelvis CT wit contrast, CBC with differential, CMP, and UA will be ordered for [name].   16:58 Radiologist called acute CT result showing  laceration of spleen  4:59PM - recheck; imaging reviewed with patient; radiology consulted; he appears to have laceration of his anterior spleen.  17:15 Dr Lovell Sheehan, surgery,  will come see patient in ED  Results for orders placed during the hospital encounter of 08/25/12  CBC WITH DIFFERENTIAL      Component Value Range   WBC 5.1  4.0 - 10.5 K/uL   RBC 2.94 (*) 4.22 - 5.81 MIL/uL   Hemoglobin 8.8 (*) 13.0 -  17.0 g/dL   HCT 40.9 (*) 81.1 - 91.4 %   MCV 87.4  78.0 - 100.0 fL   MCH 29.9  26.0 - 34.0 pg   MCHC 34.2  30.0 - 36.0 g/dL   RDW 78.2  95.6 - 21.3 %   Platelets 203  150 - 400 K/uL   Neutrophils Relative 85 (*) 43 - 77 %   Lymphocytes Relative 8 (*) 12 - 46 %   Monocytes Relative 6  3 - 12 %   Eosinophils Relative 1  0 - 5 %   Basophils Relative 0  0 - 1 %   Neutro Abs 4.3  1.7 - 7.7 K/uL   Lymphs Abs 0.4 (*) 0.7 - 4.0 K/uL   Monocytes Absolute 0.3  0.1 - 1.0 K/uL   Eosinophils Absolute 0.1  0.0 - 0.7 K/uL   Basophils Absolute 0.0  0.0 - 0.1 K/uL   WBC Morphology WHITE COUNT CONFIRMED ON SMEAR    COMPREHENSIVE METABOLIC PANEL      Component Value Range   Sodium 129 (*) 135 - 145 mEq/L   Potassium 3.7  3.5 - 5.1 mEq/L   Chloride 91 (*) 96 - 112 mEq/L   CO2 28  19 - 32 mEq/L   Glucose, Bld 130 (*) 70 - 99 mg/dL   BUN 8  6 - 23 mg/dL   Creatinine, Ser 0.86  0.50 - 1.35 mg/dL   Calcium 8.2 (*) 8.4 - 10.5 mg/dL   Total Protein 6.8  6.0 - 8.3 g/dL   Albumin 2.7 (*) 3.5 - 5.2 g/dL   AST 19  0 - 37 U/L   ALT 36  0 - 53 U/L   Alkaline Phosphatase 292 (*) 39 - 117 U/L   Total Bilirubin 0.5  0.3 - 1.2 mg/dL   GFR calc non Af Amer >90  >90 mL/min   GFR calc Af Amer >90  >90 mL/min  URINALYSIS, ROUTINE W REFLEX MICROSCOPIC      Component Value Range   Color, Urine YELLOW  YELLOW   APPearance CLOUDY (*) CLEAR   Specific Gravity, Urine >1.030 (*) 1.005 - 1.030   pH 5.5  5.0 - 8.0   Glucose, UA NEGATIVE  NEGATIVE mg/dL   Hgb urine dipstick LARGE (*) NEGATIVE   Bilirubin  Urine SMALL (*) NEGATIVE   Ketones, ur NEGATIVE  NEGATIVE mg/dL   Protein, ur 30 (*) NEGATIVE mg/dL   Urobilinogen, UA 1.0  0.0 - 1.0 mg/dL   Nitrite NEGATIVE  NEGATIVE   Leukocytes, UA NEGATIVE  NEGATIVE  URINE MICROSCOPIC-ADD ON      Component Value Range   WBC, UA 7-10  <3 WBC/hpf   RBC / HPF 21-50  <3 RBC/hpf   Bacteria, UA MANY (*) RARE   Laboratory interpretation all normal except stable anemia compared to April, 2013, hyponatremia, low chloride, hematuria   Ct Chest W Contrast Ct Abdomen Pelvis W Contrast  08/25/2012  **ADDENDUM** CREATED: 08/25/2012 17:07:21  Additional findings:  Left lower quadrant colostomy, unchanged.  Decubitus ulcers extending to the ischium bilaterally (series 2/image 28), unchanged.  AVN of the left femoral head, unchanged.  Duplicated left IVC.  **END ADDENDUM** SIGNED BY: Charline Bills, M.D.   08/25/2012  *RADIOLOGY REPORT*  Clinical Data:  Fall, left rib/abdominal pain, hematuria, history of non-Hodgkins lymphoma  CT CHEST, ABDOMEN AND PELVIS WITH CONTRAST  Technique:  Multidetector CT imaging of the chest, abdomen and pelvis was performed following the standard protocol during bolus administration  of intravenous contrast.  Contrast: OMNIPAQUE IOHEXOL 300 MG/ML  SOLN  Comparison:  CT abdomen pelvis dated 11/10/2011  CT CHEST  Findings:  No evidence of traumatic aortic injury or mediastinal hematoma.  Patchy right lower lobe opacity, possibly reflecting atelectasis or aspiration.  Left basilar opacity, likely atelectasis.  Additional dependent atelectasis in the posterior bilateral upper lobes.  No pleural effusion or pneumothorax.  Visualized thyroid is unremarkable.  The heart is normal in size.  No pericardial effusion.  Coronary atherosclerosis. Atherosclerotic calcifications of the aortic arch.  Right chest port.  Thoracic lymphadenopathy, including: --10 mm short-axis left supraclavicular node (series 2/image 4) --15 mm short-axis high right  paratracheal node (series 2/image 9) --19 mm short-axis subcarinal node (series 2/image 25)  Mild degenerative changes of the visualized thoracolumbar spine. Mild compression deformity of the T4 vertebral body (series 5/ image 29), age indeterminate, favored to be chronic.  Multiple left lateral rib fracture deformities.  No evidence of acute fracture or dislocation.  IMPRESSION: Mild compression deformity of the T4 vertebral body, age indeterminate, favored to be chronic.  Multiple left rib fracture deformities, old.  Patchy right lower lobe opacity, atelectasis versus aspiration.  Thoracic lymphadenopathy, as described above.  Given the history, lymphomatous involvement is not excluded.  Consider tissue sampling or PET-CT as clinically warranted.  CT ABDOMEN AND PELVIS  Findings:  Moderate hiatal hernia.  Liver, pancreas, and adrenal glands within normal limits.  Anterior splenic laceration (series 2/images 45 and 50), likely reflecting at least a grade II injury.  No definite active extravasation.  Associated small volume perihepatic, perisplenic, and pelvic hemorrhage.  Status post cholecystectomy.  No intrahepatic ductal dilatation. Common duct measures 14 mm, likely postsurgical.  Left kidney is notable for an 1.8 cm left renal cyst.  Right kidney is unremarkable.  No hydronephrosis.  No evidence of bowel obstruction.  Normal appendix.  Atherosclerotic calcifications of the abdominal aorta and branch vessels.  IVC filter.  No suspicious abdominopelvic lymphadenopathy.  Prostate is unremarkable.  Bladder is decompressed with indwelling Foley catheter.  Mild degenerative changes of the visualized thoracolumbar spine. Mild super endplate compression deformity at L2 (series 5/image 68), chronic.  Status post ORIF of the proximal left femur.  Old post-traumatic deformity of the pelvis.  IMPRESSION: Anterior splenic laceration, as described above, likely grade II.  Associated small volume perihepatic, perisplenic,  and pelvic hemorrhage.  Critical Value/emergent results were called by telephone at the time of interpretation on 08/25/2012 at 1658 hours to Dr. Lynelle Doctor, who verbally acknowledged these results.  Original Report Authenticated By: Charline Bills, M.D.    Dg Shoulder Left  08/25/2012  *RADIOLOGY REPORT*  Clinical Data: Fall from wheelchair.  Pain in left shoulder blade.  LEFT SHOULDER - 2+ VIEW  Comparison: Left shoulder radiographs 09/11/2007.  Findings: The patient is status post ORIF of the proximal left humerus.  The shoulders located.  No acute bone or soft tissue abnormalities are present.  The scapula appears to be intact.  The visualized left hemithorax is clear.  IMPRESSION:  1.  No acute abnormality or significant interval change. 2.  Status post ORIF of the left humerus.   Original Report Authenticated By: Marin Roberts, M.D.      1. Fall down stairs   2. Contusion, chest wall   3. Contusion of shoulder, left   4. Spleen laceration   5. Hematuria   6. Anemia    Plan admission   CRITICAL CARE Performed by: Ward Givens  Total critical care time: 35 min   Critical care time was exclusive of separately billable procedures and treating other patients.  Critical care was necessary to treat or prevent imminent or life-threatening deterioration.  Critical care was time spent personally by me on the following activities: development of treatment plan with patient and/or surrogate as well as nursing, discussions with consultants, evaluation of patient's response to treatment, examination of patient, obtaining history from patient or surrogate, ordering and performing treatments and interventions, ordering and review of laboratory studies, ordering and review of radiographic studies, pulse oximetry and re-evaluation of patient's condition.    MDM   I personally performed the services described in this documentation, which was scribed in my presence. The recorded information has  been reviewed and considered.  Devoria Albe, MD, FACEP        Ward Givens, MD 08/25/12 Rickey Primus

## 2012-08-26 LAB — HEMOGLOBIN AND HEMATOCRIT, BLOOD
HCT: 23.5 % — ABNORMAL LOW (ref 39.0–52.0)
Hemoglobin: 7.9 g/dL — ABNORMAL LOW (ref 13.0–17.0)

## 2012-08-26 LAB — BASIC METABOLIC PANEL
BUN: 10 mg/dL (ref 6–23)
CO2: 29 mEq/L (ref 19–32)
Calcium: 8.3 mg/dL — ABNORMAL LOW (ref 8.4–10.5)
Creatinine, Ser: 0.76 mg/dL (ref 0.50–1.35)
GFR calc non Af Amer: >90 mL/min (ref >90)
Glucose, Bld: 119 mg/dL — ABNORMAL HIGH (ref 70–99)
Sodium: 131 mEq/L — ABNORMAL LOW (ref 135–145)

## 2012-08-26 LAB — URINE CULTURE
Colony Count: NO GROWTH
Culture: NO GROWTH

## 2012-08-26 LAB — CBC
HCT: 23.6 % — ABNORMAL LOW (ref 39.0–52.0)
Hemoglobin: 8 g/dL — ABNORMAL LOW (ref 13.0–17.0)
MCH: 30 pg (ref 26.0–34.0)
MCHC: 33.9 g/dL (ref 30.0–36.0)
MCV: 88.4 fL (ref 78.0–100.0)
RDW: 14.3 % (ref 11.5–15.5)

## 2012-08-26 LAB — PHOSPHORUS: Phosphorus: 3.6 mg/dL (ref 2.3–4.6)

## 2012-08-26 MED ORDER — CYCLOBENZAPRINE HCL 10 MG PO TABS
10.0000 mg | ORAL_TABLET | Freq: Three times a day (TID) | ORAL | Status: DC
  Administered 2012-08-26 – 2012-09-04 (×28): 10 mg via ORAL
  Filled 2012-08-26 (×28): qty 1

## 2012-08-26 MED ORDER — PHENOBARBITAL 32.4 MG PO TABS
64.8000 mg | ORAL_TABLET | Freq: Every day | ORAL | Status: DC
  Administered 2012-08-26 – 2012-09-03 (×9): 64.8 mg via ORAL
  Filled 2012-08-26 (×8): qty 2

## 2012-08-26 MED ORDER — PRO-STAT SUGAR FREE PO LIQD
30.0000 mL | Freq: Two times a day (BID) | ORAL | Status: DC
  Administered 2012-08-26 – 2012-08-30 (×7): 30 mL via ORAL
  Filled 2012-08-26 (×11): qty 30

## 2012-08-26 MED ORDER — ENSURE COMPLETE PO LIQD
237.0000 mL | Freq: Three times a day (TID) | ORAL | Status: DC
  Administered 2012-08-26 – 2012-08-27 (×3): 237 mL via ORAL
  Administered 2012-08-27: 09:00:00 via ORAL
  Administered 2012-08-28 – 2012-09-02 (×14): 237 mL via ORAL
  Administered 2012-09-02 – 2012-09-03 (×2): via ORAL
  Administered 2012-09-03 – 2012-09-04 (×3): 237 mL via ORAL

## 2012-08-26 MED ORDER — PHENOBARBITAL 32.4 MG PO TABS
32.4000 mg | ORAL_TABLET | Freq: Every morning | ORAL | Status: DC
  Administered 2012-08-26 – 2012-09-04 (×10): 32.4 mg via ORAL
  Filled 2012-08-26 (×5): qty 1
  Filled 2012-08-26: qty 2
  Filled 2012-08-26 (×5): qty 1

## 2012-08-26 NOTE — Plan of Care (Signed)
Problem: Diagnosis - Type of Surgery Goal: General Surgical Patient Education (See Patient Education module for education specifics) Outcome: Progressing Admitted by general surgery for possible splenectomy.  Patient aware of monitoring for need.  Problem: Consults Goal: Skin Care Protocol Initiated - if Braden Score 18 or less If consults are not indicated, leave blank or document N/A Outcome: Progressing Wound Care needed for multiple large bedsores  Problem: Phase I Progression Outcomes Goal: Pain controlled with appropriate interventions Outcome: Progressing Pain to left side, ribcage and shoulder.  Continues to have Phantom pain from Right leg Goal: OOB as tolerated unless otherwise ordered Outcome: Progressing Patient on bedrest tonight.  Normally was getting oob to wheelchair on his own.  Upper body strength only.  Today fell out of wheelchair. Goal: Tubes/drains patent Outcome: Progressing Hematuria noted in foley catheter in  ED.  At present amber urine noted Goal: Initial discharge plan identified Outcome: Progressing Lives with step neice

## 2012-08-26 NOTE — Care Management Note (Signed)
    Page 1 of 2   09/04/2012     2:15:05 PM   CARE MANAGEMENT NOTE 09/04/2012  Patient:  Chris [name], Chris [name]   Account Number:  [id number]  Date Initiated:  08/26/2012  Documentation initiated by:  Sharrie Rothman  Subjective/Objective Assessment:   Pt admitted from home with a niece with a splenic laceration. Pt just moved back to the area from [place], [place] where he was living with a sister. Pt does not want any information given out to other family members about his condition.     Action/Plan:   He did give verbal consent to talk with his niece, Chris [name]. Pt stated that he has an electric wheelchair and hospital bed with air mattress. Pt stated that he has been set up with [facility] but they had not come to see him yet.   Anticipated DC Date:  08/29/2012   Anticipated DC Plan:  HOME W HOME HEALTH SERVICES      DC Planning Services  CM consult      [facility] Choice  HOME HEALTH   Choice offered to / List presented to:  C-1 Patient        HH arranged  HH-1 RN  HH-4 NURSE'S AIDE      HH agency  [facility]   Status of service:  Completed, signed off Medicare Important Message given?   (If response is "NO", the following Medicare IM given date fields will be blank) Date Medicare IM given:   Date Additional Medicare IM given:    Discharge Disposition:  HOME W HOME HEALTH SERVICES  Per UR Regulation:    If discussed at Long Length of Stay Meetings, dates discussed:   09/03/2012    Comments:  09/04/12 1400 Chris [name] D/C home with family and [facility] 09/03/12 1108 Chris Queen, RN BSN CM Pt potential discharge 09/04/12. Pt has been set up with [facility] for [facility]. Pt will need RN and aide. No DME needs noted. Pt will be returning home with niece. Chris [name] of [facility] is aware of pts need for [facility] at discharge.  08/29/12 1540 Chris Queen, RN BSN CM Pt having fevers today. Possible discharge 08/30/12 if afebrile.  08/26/12 1422 Chris Queen, RN BSN CM

## 2012-08-26 NOTE — Consult Note (Addendum)
WOC consult Note Reason for Consult: Consult requested for chronic pressure ulcers.  Pt states he has had them "for awhile" and has had 6 surgeries in the past for these areas but they have never healed completely. He is well informed regarding plan of care. Consult completed via remote camera and bedside nurse assistance. Wound type:  Left ischium 3X1X.3 stage 3 wound, 100% red, no odor, mod yellow drainage.  Right ischium 2X4X1cm stage 3 wound, 100% red, no odor, mod yellow drainage.  Left stump with stage 2 wound, 1X1cm intact fluid filled blister. Pressure Ulcer POA: Yes Dressing procedure/placement/frequency: Pt states he has had home health assistance in the past and has been using Aquacel dressings.  Will continue with present plan of care to provide antimicrobial benefits and absorb drainage.  Foam dressing to left stump to protect.  Pt has a colostomy and states he is independent with changing.  Stoma red and viable.  Pouch intact with good seal, pt denies problems with peristomal area.  Supplies ordered to bedside for patient or staff use.  Will not plan to follow further unless re-consulted.  [address], RN, MSN, [employer]  [phone]
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
## 2012-08-26 NOTE — Progress Notes (Signed)
UR Chart Review Completed  

## 2012-08-26 NOTE — Progress Notes (Signed)
Subjective: Still with abdominal pain. Patient realizes that he is going to be hard to control his pain do to his chronic pain issues and chronic oxycodone use.  Objective: Vital signs in last 24 hours: Temp:  [98 F (36.7 C)-98.9 F (37.2 C)] 98.2 F (36.8 C) (02/03 0400) Pulse Rate:  [73-91] 78  (02/03 0330) Resp:  [12-26] 12  (02/03 0330) BP: (82-125)/(47-82) 82/60 mmHg (02/03 0330) SpO2:  [90 %-99 %] 95 % (02/03 0330) Weight:  [94.3 kg (207 lb 14.3 oz)] 94.3 kg (207 lb 14.3 oz) (02/02 2024) Last BM Date:  (colostomy stoma is pink )  Intake/Output from previous day: 02/02 0701 - 02/03 0700 In: 678.3 [I.V.:678.3] Out: -  Intake/Output this shift:    General appearance: alert and cooperative Resp: clear to auscultation bilaterally Cardio: regular rate and rhythm, S1, S2 normal, no murmur, click, rub or gallop GI: Tender, but not rigid. No distention noted. Colostomy pink and patent.  Lab Results:   Basename 08/26/12 0444 08/25/12 1640  WBC 4.3 5.1  HGB 8.0* 8.8*  HCT 23.6* 25.7*  PLT 232 203   BMET  Basename 08/26/12 0444 08/25/12 1640  NA 131* 129*  K 3.6 3.7  CL 93* 91*  CO2 29 28  GLUCOSE 119* 130*  BUN 10 8  CREATININE 0.76 0.54  CALCIUM 8.3* 8.2*   PT/INR No results found for this basename: LABPROT:2,INR:2 in the last 72 hours  Studies/Results: Ct Chest W Contrast  08/25/2012  **ADDENDUM** CREATED: 08/25/2012 17:07:21  Additional findings:  Left lower quadrant colostomy, unchanged.  Decubitus ulcers extending to the ischium bilaterally (series 2/image 28), unchanged.  AVN of the left femoral head, unchanged.  Duplicated left IVC.  **END ADDENDUM** SIGNED BY: Charline Bills, M.D.   08/25/2012  *RADIOLOGY REPORT*  Clinical Data:  Fall, left rib/abdominal pain, hematuria, history of non-Hodgkins lymphoma  CT CHEST, ABDOMEN AND PELVIS WITH CONTRAST  Technique:  Multidetector CT imaging of the chest, abdomen and pelvis was performed following the standard  protocol during bolus administration of intravenous contrast.  Contrast: OMNIPAQUE IOHEXOL 300 MG/ML  SOLN  Comparison:  CT abdomen pelvis dated 11/10/2011  CT CHEST  Findings:  No evidence of traumatic aortic injury or mediastinal hematoma.  Patchy right lower lobe opacity, possibly reflecting atelectasis or aspiration.  Left basilar opacity, likely atelectasis.  Additional dependent atelectasis in the posterior bilateral upper lobes.  No pleural effusion or pneumothorax.  Visualized thyroid is unremarkable.  The heart is normal in size.  No pericardial effusion.  Coronary atherosclerosis. Atherosclerotic calcifications of the aortic arch.  Right chest port.  Thoracic lymphadenopathy, including: --10 mm short-axis left supraclavicular node (series 2/image 4) --15 mm short-axis high right paratracheal node (series 2/image 9) --19 mm short-axis subcarinal node (series 2/image 25)  Mild degenerative changes of the visualized thoracolumbar spine. Mild compression deformity of the T4 vertebral body (series 5/ image 74), age indeterminate, favored to be chronic.  Multiple left lateral rib fracture deformities.  No evidence of acute fracture or dislocation.  IMPRESSION: Mild compression deformity of the T4 vertebral body, age indeterminate, favored to be chronic.  Multiple left rib fracture deformities, old.  Patchy right lower lobe opacity, atelectasis versus aspiration.  Thoracic lymphadenopathy, as described above.  Given the history, lymphomatous involvement is not excluded.  Consider tissue sampling or PET-CT as clinically warranted.  CT ABDOMEN AND PELVIS  Findings:  Moderate hiatal hernia.  Liver, pancreas, and adrenal glands within normal limits.  Anterior splenic laceration (  series 2/images 45 and 50), likely reflecting at least [name] grade II injury.  No definite active extravasation.  Associated small volume perihepatic, perisplenic, and pelvic hemorrhage.  Status post cholecystectomy.  No intrahepatic ductal  dilatation. Common duct measures 14 mm, likely postsurgical.  Left kidney is notable for an 1.8 cm left renal cyst.  Right kidney is unremarkable.  No hydronephrosis.  No evidence of bowel obstruction.  Normal appendix.  Atherosclerotic calcifications of the abdominal aorta and branch vessels.  IVC filter.  No suspicious abdominopelvic lymphadenopathy.  Prostate is unremarkable.  Bladder is decompressed with indwelling Foley catheter.  Mild degenerative changes of the visualized thoracolumbar spine. Mild super endplate compression deformity at L2 (series 5/image 68), chronic.  Status post ORIF of the proximal left femur.  Old post-traumatic deformity of the pelvis.  IMPRESSION: Anterior splenic laceration, as described above, likely grade II.  Associated small volume perihepatic, perisplenic, and pelvic hemorrhage.  Critical Value/emergent results were called by telephone at the time of interpretation on 08/25/2012 at 1658 hours to Dr. Lynelle Doctor, who verbally acknowledged these results.  Original Report Authenticated By: Charline Bills, M.D.    Ct Abdomen Pelvis W Contrast  08/25/2012  **ADDENDUM** CREATED: 08/25/2012 17:07:21  Additional findings:  Left lower quadrant colostomy, unchanged.  Decubitus ulcers extending to the ischium bilaterally (series 2/image 28), unchanged.  AVN of the left femoral head, unchanged.  Duplicated left IVC.  **END ADDENDUM** SIGNED BY: Charline Bills, M.D.   08/25/2012  *RADIOLOGY REPORT*  Clinical Data:  Fall, left rib/abdominal pain, hematuria, history of non-Hodgkins lymphoma  CT CHEST, ABDOMEN AND PELVIS WITH CONTRAST  Technique:  Multidetector CT imaging of the chest, abdomen and pelvis was performed following the standard protocol during bolus administration of intravenous contrast.  Contrast: OMNIPAQUE IOHEXOL 300 MG/ML  SOLN  Comparison:  CT abdomen pelvis dated 11/10/2011  CT CHEST  Findings:  No evidence of traumatic aortic injury or mediastinal hematoma.  Patchy  right lower lobe opacity, possibly reflecting atelectasis or aspiration.  Left basilar opacity, likely atelectasis.  Additional dependent atelectasis in the posterior bilateral upper lobes.  No pleural effusion or pneumothorax.  Visualized thyroid is unremarkable.  The heart is normal in size.  No pericardial effusion.  Coronary atherosclerosis. Atherosclerotic calcifications of the aortic arch.  Right chest port.  Thoracic lymphadenopathy, including: --10 mm short-axis left supraclavicular node (series 2/image 4) --15 mm short-axis high right paratracheal node (series 2/image 9) --19 mm short-axis subcarinal node (series 2/image 25)  Mild degenerative changes of the visualized thoracolumbar spine. Mild compression deformity of the T4 vertebral body (series 5/ image 47), age indeterminate, favored to be chronic.  Multiple left lateral rib fracture deformities.  No evidence of acute fracture or dislocation.  IMPRESSION: Mild compression deformity of the T4 vertebral body, age indeterminate, favored to be chronic.  Multiple left rib fracture deformities, old.  Patchy right lower lobe opacity, atelectasis versus aspiration.  Thoracic lymphadenopathy, as described above.  Given the history, lymphomatous involvement is not excluded.  Consider tissue sampling or PET-CT as clinically warranted.  CT ABDOMEN AND PELVIS  Findings:  Moderate hiatal hernia.  Liver, pancreas, and adrenal glands within normal limits.  Anterior splenic laceration (series 2/images 45 and 50), likely reflecting at least [name] grade II injury.  No definite active extravasation.  Associated small volume perihepatic, perisplenic, and pelvic hemorrhage.  Status post cholecystectomy.  No intrahepatic ductal dilatation. Common duct measures 14 mm, likely postsurgical.  Left kidney is notable for an 1.8  cm left renal cyst.  Right kidney is unremarkable.  No hydronephrosis.  No evidence of bowel obstruction.  Normal appendix.  Atherosclerotic calcifications of  the abdominal aorta and branch vessels.  IVC filter.  No suspicious abdominopelvic lymphadenopathy.  Prostate is unremarkable.  Bladder is decompressed with indwelling Foley catheter.  Mild degenerative changes of the visualized thoracolumbar spine. Mild super endplate compression deformity at L2 (series 5/image 68), chronic.  Status post ORIF of the proximal left femur.  Old post-traumatic deformity of the pelvis.  IMPRESSION: Anterior splenic laceration, as described above, likely grade II.  Associated small volume perihepatic, perisplenic, and pelvic hemorrhage.  Critical Value/emergent results were called by telephone at the time of interpretation on 08/25/2012 at 1658 hours to Dr. Lynelle Doctor, who verbally acknowledged these results.  Original Report Authenticated By: Charline Bills, M.D.    Dg Shoulder Left  08/25/2012  *RADIOLOGY REPORT*  Clinical Data: Fall from wheelchair.  Pain in left shoulder blade.  LEFT SHOULDER - 2+ VIEW  Comparison: Left shoulder radiographs 09/11/2007.  Findings: The patient is status post ORIF of the proximal left humerus.  The shoulders located.  No acute bone or soft tissue abnormalities are present.  The scapula appears to be intact.  The visualized left hemithorax is clear.  IMPRESSION:  1.  No acute abnormality or significant interval change. 2.  Status post ORIF of the left humerus.   Original Report Authenticated By: Marin Roberts, M.D.     Anti-infectives: Anti-infectives    None      Assessment/Plan: Impression: Splenic laceration. Hemoglobin is somewhat down, but this is probably secondary to hydration. In reviewing his records, he chronically has [name] hemoglobin in the 8 range. Hematuria seems to be resolving. Plan: Will check another hematocrit later on today. No need for acute surgical intervention at this time. Continue monitoring in the step down unit.  LOS: 1 day    Chris [name] 08/26/2012

## 2012-08-27 LAB — BASIC METABOLIC PANEL
BUN: 7 mg/dL (ref 6–23)
Chloride: 93 mEq/L — ABNORMAL LOW (ref 96–112)
GFR calc Af Amer: >90 mL/min (ref >90)
GFR calc non Af Amer: >90 mL/min (ref >90)
Potassium: 3.8 mEq/L (ref 3.5–5.1)
Sodium: 131 mEq/L — ABNORMAL LOW (ref 135–145)

## 2012-08-27 LAB — CBC
HCT: 23.2 % — ABNORMAL LOW (ref 39.0–52.0)
MCHC: 32.8 g/dL (ref 30.0–36.0)
Platelets: 220 10*3/uL (ref 150–400)
RDW: 14.3 % (ref 11.5–15.5)
WBC: 3.1 10*3/uL — ABNORMAL LOW (ref 4.0–10.5)

## 2012-08-27 MED ORDER — PANTOPRAZOLE SODIUM 40 MG PO TBEC
40.0000 mg | DELAYED_RELEASE_TABLET | Freq: Every day | ORAL | Status: DC
  Administered 2012-08-27 – 2012-09-02 (×7): 40 mg via ORAL
  Filled 2012-08-27 (×7): qty 1

## 2012-08-27 NOTE — Plan of Care (Signed)
Problem: Phase I Progression Outcomes Goal: Incision/dressings dry and intact Outcome: Not Applicable Date Met:  08/27/12 No surgery, no icision Goal: Sutures/staples intact Outcome: Not Applicable Date Met:  08/27/12 No surgery, no incision Goal: Tubes/drains patent Outcome: Not Applicable Date Met:  08/27/12 No surgery, not tubes or drains Goal: Voiding-avoid urinary catheter unless indicated Outcome: Not Applicable Date Met:  08/27/12 Pt has chronic foley

## 2012-08-27 NOTE — Progress Notes (Signed)
Per pts request, he needed money from the ATM for a family member, but he did not want his family member to have his card and pull the money out. Clarisa Fling, RN and Dorene Sorrow from Security went down to the ATM in the main lobby. I was a witness in the room when he requested the money and how much. Madison Hickman and Dorene Sorrow returned and I was present when he received his money and card back.

## 2012-08-27 NOTE — Progress Notes (Signed)
Subjective: Patient still left upper quarter abdominal pain, although this has not changed since admission.  Objective: Vital signs in last 24 hours: Temp:  [97.7 F (36.5 C)-98 F (36.7 C)] 98 F (36.7 C) (02/04 0400) Pulse Rate:  [74-90] 87  (02/04 0900) Resp:  [9-21] 13  (02/04 0900) BP: (86-123)/(48-70) 101/48 mmHg (02/04 0900) SpO2:  [92 %-97 %] 92 % (02/04 0900) Weight:  [95.7 kg (210 lb 15.7 oz)] 95.7 kg (210 lb 15.7 oz) (02/04 0500) Last BM Date:  (colostomy stoma is pink )  Intake/Output from previous day: 02/03 0701 - 02/04 0700 In: 2700 [I.V.:2700] Out: 2100 [Urine:2100] Intake/Output this shift: Total I/O In: 560 [P.O.:360; I.V.:200] Out: -   General appearance: alert, cooperative and no distress Resp: clear to auscultation bilaterally Cardio: regular rate and rhythm, S1, S2 normal, no murmur, click, rub or gallop GI: Soft, nontender. Colostomy pink and patent. Does have tenderness in left upper quadrant to palpation. No rigidity noted.  Lab Results:   Basename 08/27/12 0439 08/26/12 1521 08/26/12 0444  WBC 3.1* -- 4.3  HGB 7.6* 7.9* --  HCT 23.2* 23.5* --  PLT 220 -- 232   BMET  Basename 08/27/12 0439 08/26/12 0444  NA 131* 131*  K 3.8 3.6  CL 93* 93*  CO2 30 29  GLUCOSE 118* 119*  BUN 7 10  CREATININE 0.56 0.76  CALCIUM 8.0* 8.3*   PT/INR No results found for this basename: LABPROT:2,INR:2 in the last 72 hours  Studies/Results: Ct Chest W Contrast  08/25/2012  **ADDENDUM** CREATED: 08/25/2012 17:07:21  Additional findings:  Left lower quadrant colostomy, unchanged.  Decubitus ulcers extending to the ischium bilaterally (series 2/image 28), unchanged.  AVN of the left femoral head, unchanged.  Duplicated left IVC.  **END ADDENDUM** SIGNED BY: Charline Bills, M.D.   08/25/2012  *RADIOLOGY REPORT*  Clinical Data:  Fall, left rib/abdominal pain, hematuria, history of non-Hodgkins lymphoma  CT CHEST, ABDOMEN AND PELVIS WITH CONTRAST  Technique:   Multidetector CT imaging of the chest, abdomen and pelvis was performed following the standard protocol during bolus administration of intravenous contrast.  Contrast: OMNIPAQUE IOHEXOL 300 MG/ML  SOLN  Comparison:  CT abdomen pelvis dated 11/10/2011  CT CHEST  Findings:  No evidence of traumatic aortic injury or mediastinal hematoma.  Patchy right lower lobe opacity, possibly reflecting atelectasis or aspiration.  Left basilar opacity, likely atelectasis.  Additional dependent atelectasis in the posterior bilateral upper lobes.  No pleural effusion or pneumothorax.  Visualized thyroid is unremarkable.  The heart is normal in size.  No pericardial effusion.  Coronary atherosclerosis. Atherosclerotic calcifications of the aortic arch.  Right chest port.  Thoracic lymphadenopathy, including: --10 mm short-axis left supraclavicular node (series 2/image 4) --15 mm short-axis high right paratracheal node (series 2/image 9) --19 mm short-axis subcarinal node (series 2/image 25)  Mild degenerative changes of the visualized thoracolumbar spine. Mild compression deformity of the T4 vertebral body (series 5/ image 21), age indeterminate, favored to be chronic.  Multiple left lateral rib fracture deformities.  No evidence of acute fracture or dislocation.  IMPRESSION: Mild compression deformity of the T4 vertebral body, age indeterminate, favored to be chronic.  Multiple left rib fracture deformities, old.  Patchy right lower lobe opacity, atelectasis versus aspiration.  Thoracic lymphadenopathy, as described above.  Given the history, lymphomatous involvement is not excluded.  Consider tissue sampling or PET-CT as clinically warranted.  CT ABDOMEN AND PELVIS  Findings:  Moderate hiatal hernia.  Liver, pancreas, and  adrenal glands within normal limits.  Anterior splenic laceration (series 2/images 45 and 50), likely reflecting at least a grade II injury.  No definite active extravasation.  Associated small volume  perihepatic, perisplenic, and pelvic hemorrhage.  Status post cholecystectomy.  No intrahepatic ductal dilatation. Common duct measures 14 mm, likely postsurgical.  Left kidney is notable for an 1.8 cm left renal cyst.  Right kidney is unremarkable.  No hydronephrosis.  No evidence of bowel obstruction.  Normal appendix.  Atherosclerotic calcifications of the abdominal aorta and branch vessels.  IVC filter.  No suspicious abdominopelvic lymphadenopathy.  Prostate is unremarkable.  Bladder is decompressed with indwelling Foley catheter.  Mild degenerative changes of the visualized thoracolumbar spine. Mild super endplate compression deformity at L2 (series 5/image 68), chronic.  Status post ORIF of the proximal left femur.  Old post-traumatic deformity of the pelvis.  IMPRESSION: Anterior splenic laceration, as described above, likely grade II.  Associated small volume perihepatic, perisplenic, and pelvic hemorrhage.  Critical Value/emergent results were called by telephone at the time of interpretation on 08/25/2012 at 1658 hours to Dr. Lynelle Doctor, who verbally acknowledged these results.  Original Report Authenticated By: Charline Bills, M.D.    Ct Abdomen Pelvis W Contrast  08/25/2012  **ADDENDUM** CREATED: 08/25/2012 17:07:21  Additional findings:  Left lower quadrant colostomy, unchanged.  Decubitus ulcers extending to the ischium bilaterally (series 2/image 28), unchanged.  AVN of the left femoral head, unchanged.  Duplicated left IVC.  **END ADDENDUM** SIGNED BY: Charline Bills, M.D.   08/25/2012  *RADIOLOGY REPORT*  Clinical Data:  Fall, left rib/abdominal pain, hematuria, history of non-Hodgkins lymphoma  CT CHEST, ABDOMEN AND PELVIS WITH CONTRAST  Technique:  Multidetector CT imaging of the chest, abdomen and pelvis was performed following the standard protocol during bolus administration of intravenous contrast.  Contrast: OMNIPAQUE IOHEXOL 300 MG/ML  SOLN  Comparison:  CT abdomen pelvis dated  11/10/2011  CT CHEST  Findings:  No evidence of traumatic aortic injury or mediastinal hematoma.  Patchy right lower lobe opacity, possibly reflecting atelectasis or aspiration.  Left basilar opacity, likely atelectasis.  Additional dependent atelectasis in the posterior bilateral upper lobes.  No pleural effusion or pneumothorax.  Visualized thyroid is unremarkable.  The heart is normal in size.  No pericardial effusion.  Coronary atherosclerosis. Atherosclerotic calcifications of the aortic arch.  Right chest port.  Thoracic lymphadenopathy, including: --10 mm short-axis left supraclavicular node (series 2/image 4) --15 mm short-axis high right paratracheal node (series 2/image 9) --19 mm short-axis subcarinal node (series 2/image 25)  Mild degenerative changes of the visualized thoracolumbar spine. Mild compression deformity of the T4 vertebral body (series 5/ image 66), age indeterminate, favored to be chronic.  Multiple left lateral rib fracture deformities.  No evidence of acute fracture or dislocation.  IMPRESSION: Mild compression deformity of the T4 vertebral body, age indeterminate, favored to be chronic.  Multiple left rib fracture deformities, old.  Patchy right lower lobe opacity, atelectasis versus aspiration.  Thoracic lymphadenopathy, as described above.  Given the history, lymphomatous involvement is not excluded.  Consider tissue sampling or PET-CT as clinically warranted.  CT ABDOMEN AND PELVIS  Findings:  Moderate hiatal hernia.  Liver, pancreas, and adrenal glands within normal limits.  Anterior splenic laceration (series 2/images 45 and 50), likely reflecting at least a grade II injury.  No definite active extravasation.  Associated small volume perihepatic, perisplenic, and pelvic hemorrhage.  Status post cholecystectomy.  No intrahepatic ductal dilatation. Common duct measures 14 mm, likely  postsurgical.  Left kidney is notable for an 1.8 cm left renal cyst.  Right kidney is unremarkable.  No  hydronephrosis.  No evidence of bowel obstruction.  Normal appendix.  Atherosclerotic calcifications of the abdominal aorta and branch vessels.  IVC filter.  No suspicious abdominopelvic lymphadenopathy.  Prostate is unremarkable.  Bladder is decompressed with indwelling Foley catheter.  Mild degenerative changes of the visualized thoracolumbar spine. Mild super endplate compression deformity at L2 (series 5/image 68), chronic.  Status post ORIF of the proximal left femur.  Old post-traumatic deformity of the pelvis.  IMPRESSION: Anterior splenic laceration, as described above, likely grade II.  Associated small volume perihepatic, perisplenic, and pelvic hemorrhage.  Critical Value/emergent results were called by telephone at the time of interpretation on 08/25/2012 at 1658 hours to Dr. Lynelle Doctor, who verbally acknowledged these results.  Original Report Authenticated By: Charline Bills, M.D.    Dg Shoulder Left  08/25/2012  *RADIOLOGY REPORT*  Clinical Data: Fall from wheelchair.  Pain in left shoulder blade.  LEFT SHOULDER - 2+ VIEW  Comparison: Left shoulder radiographs 09/11/2007.  Findings: The patient is status post ORIF of the proximal left humerus.  The shoulders located.  No acute bone or soft tissue abnormalities are present.  The scapula appears to be intact.  The visualized left hemithorax is clear.  IMPRESSION:  1.  No acute abnormality or significant interval change. 2.  Status post ORIF of the left humerus.   Original Report Authenticated By: Marin Roberts, M.D.     Anti-infectives: Anti-infectives    None      Assessment/Plan: Impression: Splenic laceration secondary to fall. Hematocrit has slowly drift down. His blood pressure remains low but stable without tachycardia. He has a history of chronic anemia secondary to his lymphoma and multiple medical problems. Will transfuse 2 units of packed red blood cells today. Still no need for splenectomy at this time  LOS: 2 days     Cimone Fahey A 08/27/2012

## 2012-08-27 NOTE — Progress Notes (Signed)
Pt needed for me to go to ATM and pull out money for him to give to his niece, stated that he didn't want to give the card to her, afraid she would over draw him.  Called security to accompany me, he accompanied me and witnessed the amount I withdrew.  Arrived back to the pt's room and gave him the amount he asked for which was $100 and the printed ATM receipt and his ATM card and witnessed by security guard and charge nurse Kween Bacorn Emerald, RN

## 2012-08-27 NOTE — Progress Notes (Signed)
The patient is receiving Protonix by the intravenous route.  Based on criteria approved by the Pharmacy and Therapeutics Committee and the Medical Executive Committee, the medication is being converted to the equivalent oral dose form.  These criteria include: -No Active GI bleeding -Able to tolerate diet of full liquids (or better) or tube feeding OR able to tolerate other medications by the oral or enteral route  If you have any questions about this conversion, please contact the Pharmacy Department (ext [phone]).  Thank you.  Elson Clan, [facility] 08/27/2012 3:15 PM

## 2012-08-27 NOTE — Progress Notes (Signed)
Pt became very verbally aggressive and irate over pain meds.  When pt wanted prn breakthrough pain med earlier in the shift, I asked him did he want the IV Dilaudid or the Oxy IR.  I explained that it would not be too long until the scheduled Oxy was due.  He stated that he wanted the IV Dilaudid and that then he could have the pills in a little bit.  I understood this to mean that he understood and that he meant give him the IV Dilaudid now and that he will get the pills in a little bit--> meaning the scheduled Oxy.  He called the charge nurse in his room asking about his Oxy.  I went back in his room and explained that I went over all the pills I gave him for his 1000 meds at 1052 and that he got his scheduled Oxy.  He went through a very confusing explanation that the outcome was that he thought I was going to give him the PRN IV Dilaudid and in about an hour the PRN Oxy, and then the scheduled Oxy at the 1000 admin time.  I explained to him that I could not give him that much pain meds at once.  The pt became increasing aggressive and stated that if he knew "you were going to do me that way, I wouldn't have gotten the Dilaudid".  I apologized for the confusion and again explained what I thought he meant.  He said, "why won't YOU give me all of that at one time, that's what they have been doing for me b/c I have such a high tolerance".  I stepped out of the room and conferred w/ the charge nurse.  She at that time looked back at his med admin and saw that they have not been administrating his pain meds in that manner and that it appears to be an hour separation of the scheduled pain med and the prn breakthrough Oxy admin.  I went back into the pt's room and explained this to him and he got even more irate and said, "well, i will be dead by then, I hope i am dead by then".  I explained to the pt that this was for his safety and that there was just no way that I could give him that much pain med, plus the  Phenobarbital and the cyclobenzaprine all at the same time and I hoped he would understand.  Will cont to monitor

## 2012-08-27 NOTE — Progress Notes (Signed)
Came back into pt's room at about 1025 after being off the floor.  Charge nurse was going to give him prn breakthrough pain med at 1152 as I was off the floor, however, she had an emergent situation and was unable to so when I came back in I immediately went to get his prn breakthrough med.  I again apologized to the pt that we had a miscommunication about the pain meds and that in no way wanted him to be in pain and that i was very empathetic to his situation w/ pain.  He at this time apologized to me for how he acted and stated it was no problem, it was just a miscommunication and he wasn't upset w/ me anymore.  I thanked the pt for being so understanding and told him I would explain things a lot more clearly in the future. He thanked me

## 2012-08-28 LAB — BASIC METABOLIC PANEL
Chloride: 94 mEq/L — ABNORMAL LOW (ref 96–112)
GFR calc Af Amer: >90 mL/min (ref >90)
GFR calc non Af Amer: >90 mL/min (ref >90)
Glucose, Bld: 125 mg/dL — ABNORMAL HIGH (ref 70–99)
Potassium: 4 mEq/L (ref 3.5–5.1)
Sodium: 132 mEq/L — ABNORMAL LOW (ref 135–145)

## 2012-08-28 LAB — CBC
Hemoglobin: 9.4 g/dL — ABNORMAL LOW (ref 13.0–17.0)
MCHC: 33.9 g/dL (ref 30.0–36.0)
WBC: 3.3 10*3/uL — ABNORMAL LOW (ref 4.0–10.5)

## 2012-08-28 NOTE — Progress Notes (Signed)
  Subjective: No change in abdominal pain.  Objective: Vital signs in last 24 hours: Temp:  [98.2 F (36.8 C)-100 F (37.8 C)] 98.8 F (37.1 C) (02/05 0821) Pulse Rate:  [67-89] 85  (02/05 0900) Resp:  [9-22] 13  (02/05 0900) BP: (86-126)/(33-88) 111/67 mmHg (02/05 0900) SpO2:  [88 %-97 %] 93 % (02/05 0900) Weight:  [92.4 kg (203 lb 11.3 oz)] 92.4 kg (203 lb 11.3 oz) (02/05 0500) Last BM Date: 08/26/12  Intake/Output from previous day: 02/04 0701 - 02/05 0700 In: 2310 [P.O.:360; I.V.:1600; Blood:350] Out: 5250 [Urine:5250] Intake/Output this shift: Total I/O In: 200 [I.V.:200] Out: -   General appearance: alert, cooperative and no distress GI: Soft, nondistended. Still tender in the left upper quadrant, unchanged from yesterday. No rigidity noted.  Lab Results:   Basename 08/28/12 0453 08/27/12 0439  WBC 3.3* 3.1*  HGB 9.4* 7.6*  HCT 27.7* 23.2*  PLT 210 220   BMET  Basename 08/28/12 0453 08/27/12 0439  NA 132* 131*  K 4.0 3.8  CL 94* 93*  CO2 31 30  GLUCOSE 125* 118*  BUN 7 7  CREATININE 0.56 0.56  CALCIUM 8.4 8.0*   PT/INR No results found for this basename: LABPROT:2,INR:2 in the last 72 hours  Studies/Results: No results found.  Anti-infectives: Anti-infectives    None      Assessment/Plan: Impression: Splenic laceration, stable. Did receive 2 units of packed red blood cells, which lab work shows he responded to appropriately. Hematuria has totally resolved. Plan: Anticipate discharge in next 24-48 hours. He was removed home health assistance as well as an ambulance for transportation home.  LOS: 3 days    Jahniah Pallas A 08/28/2012

## 2012-08-29 ENCOUNTER — Inpatient Hospital Stay (HOSPITAL_COMMUNITY): Payer: Medicaid Other

## 2012-08-29 LAB — TYPE AND SCREEN
Unit division: 0
Unit division: 0

## 2012-08-29 LAB — URINALYSIS, ROUTINE W REFLEX MICROSCOPIC
Glucose, UA: NEGATIVE mg/dL
Specific Gravity, Urine: 1.015 (ref 1.005–1.030)

## 2012-08-29 LAB — URINE MICROSCOPIC-ADD ON

## 2012-08-29 LAB — CBC
HCT: 33.8 % — ABNORMAL LOW (ref 39.0–52.0)
Hemoglobin: 11.2 g/dL — ABNORMAL LOW (ref 13.0–17.0)
MCH: 29.6 pg (ref 26.0–34.0)
RBC: 3.78 MIL/uL — ABNORMAL LOW (ref 4.22–5.81)

## 2012-08-29 MED ORDER — ACETAMINOPHEN 160 MG/5ML PO SOLN: 650.0000 mg | ORAL | Status: DC | PRN

## 2012-08-29 MED ORDER — ACETAMINOPHEN 160 MG/5ML PO SOLN
650.0000 mg | ORAL | Status: DC | PRN
  Administered 2012-08-29 (×2): 650 mg
  Filled 2012-08-29 (×2): qty 20.3

## 2012-08-29 MED ORDER — ONDANSETRON HCL 4 MG/2ML IJ SOLN
4.0000 mg | INTRAMUSCULAR | Status: DC | PRN
  Administered 2012-08-29 – 2012-08-31 (×5): 4 mg via INTRAVENOUS
  Filled 2012-08-29 (×6): qty 2

## 2012-08-29 MED ORDER — ACETAMINOPHEN 325 MG PO TABS
650.0000 mg | ORAL_TABLET | ORAL | Status: DC | PRN
  Administered 2012-08-29 – 2012-09-01 (×5): 650 mg via ORAL
  Filled 2012-08-29 (×5): qty 2

## 2012-08-29 NOTE — Progress Notes (Signed)
I was called by the staff with fever   Cultures ordered; tylenol ordered prn for fever.

## 2012-08-29 NOTE — Progress Notes (Addendum)
PT TRANSFERRED TO ROOM 313. PT ALERT AND ORIENTED. NO DIFFICULTY SHALLOWING, IV AT RT CHEST PORT-A-CATH INFUSING W/O DIFFICULTY. COLOSTOMY PATENT DRAINING MUDDY BROWN LIQUID STOOL. FOLEY CATHETER DRAINING CLEAR AMBER URINE..VSS. REPORT GIVEN TO ASHLEY  RN ON 300.

## 2012-08-29 NOTE — Progress Notes (Signed)
Note reviewed. Agree with plan.  #[phone] 

## 2012-08-29 NOTE — Progress Notes (Signed)
Went in to check on patient and take pain medication - patient states he had a seizure a few minutes ago.  Pt appears to be fine now, no exhibiting any signs or symptoms.  Patient states he feels them coming on when he has numbness around his mouth, and then will black out for a few moments.  Temperature is now 98.6 after having fever this afternoon.  MD notified and made aware.  Patient is receiving phenobaritol here as well for seizures.  Told patient to alert nurse if he feels another seizure coming on.  Will continue to monitor.

## 2012-08-29 NOTE — Progress Notes (Signed)
NUTRITION FOLLOW UP  Intervention:   1. Continue Ensure Complete, but decrease from TID to BID which will provide 700 kcal and 26 grams of protein 2. Continue Pro-Stat BID which will provide 200 kcal and 30 grams of protein, please mix with juice 3. RD will continue to follow  Nutrition Dx:   Inadequate oral intake related to increased needs to promote wound healing as evidenced by unhealing wounds despite multiple surgeries  Goal:   Meet >/=90% estimated nutrition needs, Met  Monitor:   Diet advancement, weight trends, PO's, I/O's  Assessment:   Pt very nauseous at this time and appears to be in pain.  Pt states that he is tolerating the Ensure well and enjoys it. He reports that this morning Pro-stat was mixed in with Ensure and it made his feel very sick. He says that when it is mixed with apple juice he tolerates it well. Please mix Pro-Stat with juice per pt preference.  Height: Ht Readings from Last 1 Encounters:  08/25/12 4' (1.219 m)    Weight Status:   Wt Readings from Last 1 Encounters:  08/29/12 210 lb 5.1 oz (95.4 kg)    Re-estimated needs:  Kcal: 1900-2100 Protein: 110-130 grams Fluid: 1.9-2.1 L/day  Skin: Stage III pressure ulcers x2; Stage II pressure ulcer; Serous-filled blister   Diet Order: Dysphagia (Dys 3 with thin liquids)   Intake/Output Summary (Last 24 hours) at 08/29/12 0953 Last data filed at 08/29/12 0900  Gross per 24 hour  Intake    820 ml  Output   1100 ml  Net   -280 ml    Last BM: 2/3   Labs:   Lab 08/28/12 0453 08/27/12 0439 08/26/12 0444  NA 132* 131* 131*  K 4.0 3.8 3.6  CL 94* 93* 93*  CO2 31 30 29   BUN 7 7 10   CREATININE 0.56 0.56 0.76  CALCIUM 8.4 8.0* 8.3*  MG -- -- 2.1  PHOS -- -- 3.6  GLUCOSE 125* 118* 119*    CBG (last 3)  No results found for this basename: GLUCAP:3 in the last 72 hours  Scheduled Meds:   . cyclobenzaprine  10 mg Oral TID  . docusate sodium  100 mg Oral BID  . feeding supplement   237 mL Oral TID BM  . feeding supplement  30 mL Oral BID WC  . nicotine  21 mg Transdermal Daily  . OxyCODONE  80 mg Oral TID  . pantoprazole  40 mg Oral Daily  . phenobarbital  32.4 mg Oral q morning - 10a  . phenobarbital  64.8 mg Oral QHS    Continuous Infusions:   . lactated ringers 10 mL/hr at 08/29/12 0900    Trenton Gammon Dietetic Intern # [phone]

## 2012-08-29 NOTE — Progress Notes (Signed)
  Subjective: Patient states his abdominal pain is somewhat improved. He has no productive cough. He did have a fever this morning of 103.  Objective: Vital signs in last 24 hours: Temp:  [98.1 F (36.7 C)-103 F (39.4 C)] 103 F (39.4 C) (02/06 0802) Pulse Rate:  [54-106] 106  (02/06 0600) Resp:  [11-31] 22  (02/06 0700) BP: (74-157)/(35-79) 125/68 mmHg (02/06 0700) SpO2:  [86 %-98 %] 96 % (02/06 0600) Weight:  [95.4 kg (210 lb 5.1 oz)] 95.4 kg (210 lb 5.1 oz) (02/06 0500) Last BM Date: 08/28/12  Intake/Output from previous day: 02/05 0701 - 02/06 0700 In: 390 [I.V.:390] Out: 1050 [Urine:1050] Intake/Output this shift:    General appearance: alert, cooperative and no distress Resp: clear to auscultation bilaterally Cardio: regular rate and rhythm, S1, S2 normal, no murmur, click, rub or gallop GI: Soft, nondistended. Decreased tenderness noted in the left upper quadrant.  Lab Results:   Basename 08/29/12 0538 08/28/12 0453  WBC 4.5 3.3*  HGB 11.2* 9.4*  HCT 33.8* 27.7*  PLT 175 210   BMET  Basename 08/28/12 0453 08/27/12 0439  NA 132* 131*  K 4.0 3.8  CL 94* 93*  CO2 31 30  GLUCOSE 125* 118*  BUN 7 7  CREATININE 0.56 0.56  CALCIUM 8.4 8.0*   PT/INR No results found for this basename: LABPROT:2,INR:2 in the last 72 hours  Studies/Results: Dg Chest Port 1 View  08/29/2012  *RADIOLOGY REPORT*  Clinical Data: Fever.  Immunocompromised patient.  History of colon cancers.  PORTABLE CHEST - 1 VIEW  Comparison: 11/12/2011  Findings: Shallow inspiration.  Mild cardiac enlargement with borderline pulmonary vascularity.  This is likely be normal for the technique.  There is infiltration or atelectasis in both lung bases.  No blunting of costophrenic angles.  No pneumothorax. Stable appearance of right central venous catheter.  Old left rib deformities.  Surgical clips in the left axilla.  Interval resolution of previous infiltration on the left.  IMPRESSION: Shallow  inspiration with infiltration or atelectasis in the lung bases.  Improved left lung infiltration since previous study.   Original Report Authenticated By: Burman Nieves, M.D.    Dg Chest Port 1v Same Day  08/29/2012  *RADIOLOGY REPORT*  Clinical Data: Fever, personal history of colon carcinoma  PORTABLE CHEST - 1 VIEW SAME DAY  Comparison: 08/29/2012 0015 hours  Findings: Cardiac shadow is stable.  A right-sided chest wall port is again seen.  The overall inspiratory effort is stable.  Multiple old rib fractures are noted on the left.  Some right basilar atelectasis is again seen with elevation of the right hemidiaphragm.  IMPRESSION: When compared with prior exam, no significant interval change is noted.   Original Report Authenticated By: Alcide Clever, M.D.     Anti-infectives: Anti-infectives    None      Assessment/Plan: Impression: Fever of unknown etiology. Chest x-ray did not show any significant infiltrate. White blood cell count within normal limits. Urine and blood cultures are pending. 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 We'll continue to observe him for 24 hours. Hemoglobin is stable.  LOS: 4 days    Braxxton Stoudt A 08/29/2012

## 2012-08-30 ENCOUNTER — Inpatient Hospital Stay (HOSPITAL_COMMUNITY): Payer: Medicaid Other

## 2012-08-30 LAB — BLOOD GAS, ARTERIAL
Acid-Base Excess: 5.8 mmol/L — ABNORMAL HIGH (ref 0.0–2.0)
Bicarbonate: 29.2 mEq/L — ABNORMAL HIGH (ref 20.0–24.0)
O2 Saturation: 94.6 %
Patient temperature: 39.4
TCO2: 26.8 mmol/L (ref 0–100)
pO2, Arterial: 67.3 mmHg — ABNORMAL LOW (ref 80.0–100.0)

## 2012-08-30 LAB — URINE CULTURE
Colony Count: NO GROWTH
Culture: NO GROWTH
Culture: NO GROWTH

## 2012-08-30 LAB — BASIC METABOLIC PANEL
BUN: 11 mg/dL (ref 6–23)
CO2: 31 mEq/L (ref 19–32)
Calcium: 8.8 mg/dL (ref 8.4–10.5)
Chloride: 86 mEq/L — ABNORMAL LOW (ref 96–112)
Creatinine, Ser: 0.58 mg/dL (ref 0.50–1.35)
Glucose, Bld: 120 mg/dL — ABNORMAL HIGH (ref 70–99)

## 2012-08-30 LAB — CBC
HCT: 34.1 % — ABNORMAL LOW (ref 39.0–52.0)
MCH: 29.6 pg (ref 26.0–34.0)
MCHC: 33.1 g/dL (ref 30.0–36.0)
MCV: 89.3 fL (ref 78.0–100.0)
Platelets: 181 10*3/uL (ref 150–400)
RDW: 15.3 % (ref 11.5–15.5)
WBC: 4.8 10*3/uL (ref 4.0–10.5)

## 2012-08-30 MED ORDER — FLUCONAZOLE IN SODIUM CHLORIDE 200-0.9 MG/100ML-% IV SOLN
INTRAVENOUS | Status: AC
  Filled 2012-08-30: qty 200

## 2012-08-30 MED ORDER — IOHEXOL 300 MG/ML  SOLN
50.0000 mL | Freq: Once | INTRAMUSCULAR | Status: AC | PRN
  Administered 2012-08-30: 50 mL via ORAL

## 2012-08-30 MED ORDER — IOHEXOL 300 MG/ML  SOLN
100.0000 mL | Freq: Once | INTRAMUSCULAR | Status: AC | PRN
  Administered 2012-08-30: 100 mL via INTRAVENOUS

## 2012-08-30 MED ORDER — FLUCONAZOLE IN SODIUM CHLORIDE 400-0.9 MG/200ML-% IV SOLN
400.0000 mg | Freq: Every day | INTRAVENOUS | Status: DC
  Administered 2012-08-30 – 2012-09-04 (×6): 400 mg via INTRAVENOUS
  Filled 2012-08-30 (×7): qty 200

## 2012-08-30 MED ORDER — DEXTROSE-NACL 5-0.9 % IV SOLN
INTRAVENOUS | Status: DC
  Administered 2012-08-30: 11:00:00 via INTRAVENOUS
  Administered 2012-08-31: 1000 mL via INTRAVENOUS
  Administered 2012-08-31 – 2012-09-04 (×5): via INTRAVENOUS

## 2012-08-30 NOTE — Progress Notes (Signed)
UR Chart Review Completed  

## 2012-08-30 NOTE — Progress Notes (Signed)
Patient's blood cultures came back with yeast and his temperature this morning spiked up to 103.4.  On-call MD made aware.

## 2012-08-30 NOTE — Progress Notes (Signed)
Subjective: Still having fevers with some mild nausea.  Objective: Vital signs in last 24 hours: Temp:  [100.2 F (37.9 C)-103.4 F (39.7 C)] 102.8 F (39.3 C) (02/07 0640) Pulse Rate:  [84-110] 110  (02/07 0543) Resp:  [11-18] 16  (02/07 0543) BP: (106-150)/(57-89) 138/70 mmHg (02/07 0543) SpO2:  [95 %-99 %] 95 % (02/07 0543) Weight:  [94.62 kg (208 lb 9.6 oz)] 94.62 kg (208 lb 9.6 oz) (02/07 0543) Last BM Date: 08/29/12  Intake/Output from previous day: 02/06 0701 - 02/07 0700 In: 1489.8 [P.O.:1320; I.V.:169.8] Out: 675 [Urine:675] Intake/Output this shift:    General appearance: alert, cooperative and no distress Resp: Relatively clear with poor inspiratory effort, no wheezing noted. Cardio: regular rate and rhythm, S1, S2 normal, no murmur, click, rub or gallop GI: Soft with nonspecific intermittent tenderness to palpation. No rigidity or distention noted. Colostomy pink and patent.  Lab Results:   Basename 08/30/12 0447 08/29/12 0538  WBC 4.8 4.5  HGB 11.3* 11.2*  HCT 34.1* 33.8*  PLT 181 175   BMET  Basename 08/30/12 0447 08/28/12 0453  NA 128* 132*  K 4.1 4.0  CL 86* 94*  CO2 31 31  GLUCOSE 120* 125*  BUN 11 7  CREATININE 0.58 0.56  CALCIUM 8.8 8.4   PT/INR No results found for this basename: LABPROT:2,INR:2 in the last 72 hours  Studies/Results: Dg Chest Port 1 View  08/29/2012  *RADIOLOGY REPORT*  Clinical Data: Fever.  Immunocompromised patient.  History of colon cancers.  PORTABLE CHEST - 1 VIEW  Comparison: 11/12/2011  Findings: Shallow inspiration.  Mild cardiac enlargement with borderline pulmonary vascularity.  This is likely be normal for the technique.  There is infiltration or atelectasis in both lung bases.  No blunting of costophrenic angles.  No pneumothorax. Stable appearance of right central venous catheter.  Old left rib deformities.  Surgical clips in the left axilla.  Interval resolution of previous infiltration on the left.   IMPRESSION: Shallow inspiration with infiltration or atelectasis in the lung bases.  Improved left lung infiltration since previous study.   Original Report Authenticated By: Burman Nieves, M.D.    Dg Chest Port 1v Same Day  08/29/2012  *RADIOLOGY REPORT*  Clinical Data: Fever, personal history of colon carcinoma  PORTABLE CHEST - 1 VIEW SAME DAY  Comparison: 08/29/2012 0015 hours  Findings: Cardiac shadow is stable.  A right-sided chest wall port is again seen.  The overall inspiratory effort is stable.  Multiple old rib fractures are noted on the left.  Some right basilar atelectasis is again seen with elevation of the right hemidiaphragm.  IMPRESSION: When compared with prior exam, no significant interval change is noted.   Original Report Authenticated By: Alcide Clever, M.D.     Anti-infectives: Anti-infectives     Start     Dose/Rate Route Frequency Ordered Stop   08/30/12 0630   fluconazole (DIFLUCAN) IVPB 400 mg        400 mg 100 mL/hr over 120 Minutes Intravenous Daily 08/30/12 0614            Assessment/Plan: Impression: 1 blood culture done peripherally shows yeast. The source of this is unknown. He is immunocompromised do to his B-cell lymphoma. He does have a chronic indwelling Foley catheter. Port-A-Cath site is clean. We'll repeat CT scan of abdomen to assess his splenic rupture, though he does not show evidence of ongoing bleeding. We'll also get a chest x-ray to assess his lungs. He has been started on Diflucan.  LOS: 5 days    Chris [name] A 08/30/2012

## 2012-08-30 NOTE — Progress Notes (Signed)
CT scan of the abdomen showed a probable left lower lobe embolus.  Splenic rupture appears stable with resolving blood.  U/S of left leg shows no thrombus.  Patient has a contraindication for anticoagulation.  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  Any origin of the thrombus is probably from the upper venous system.  ABG stable.

## 2012-08-30 NOTE — Progress Notes (Signed)
19G Huber needle inserted into right side power port for administration of contrast. Sterile dressing applied.

## 2012-08-31 LAB — CBC
HCT: 27.3 % — ABNORMAL LOW (ref 39.0–52.0)
Hemoglobin: 9 g/dL — ABNORMAL LOW (ref 13.0–17.0)
MCH: 29.1 pg (ref 26.0–34.0)
MCHC: 33 g/dL (ref 30.0–36.0)
RBC: 3.09 MIL/uL — ABNORMAL LOW (ref 4.22–5.81)

## 2012-08-31 LAB — BASIC METABOLIC PANEL
BUN: 9 mg/dL (ref 6–23)
CO2: 29 mEq/L (ref 19–32)
Calcium: 8 mg/dL — ABNORMAL LOW (ref 8.4–10.5)
Glucose, Bld: 113 mg/dL — ABNORMAL HIGH (ref 70–99)
Sodium: 128 mEq/L — ABNORMAL LOW (ref 135–145)

## 2012-09-01 LAB — HEMOGLOBIN AND HEMATOCRIT, BLOOD: HCT: 29.8 % — ABNORMAL LOW (ref 39.0–52.0)

## 2012-09-01 NOTE — Progress Notes (Signed)
  Subjective: Pain about the same. No nausea. Some fevers and chills through the night.  Objective: Vital signs in last 24 hours: Temp:  [99.7 F (37.6 C)-101.2 F (38.4 C)] 100.7 F (38.2 C) (02/09 0624) Pulse Rate:  [93-103] 100 (02/09 0624) Resp:  [18-19] 19 (02/09 0624) BP: (112-124)/(60-71) 118/60 mmHg (02/09 0624) SpO2:  [93 %-96 %] 96 % (02/09 0624) Last BM Date: 09/01/12  Intake/Output from previous day: 02/08 0701 - 02/09 0700 In: 3850 [P.O.:840; I.V.:3010] Out: 950 [Urine:950] Intake/Output this shift: Total I/O In: 240 [P.O.:240] Out: -   General appearance: alert and no distress Resp: Decreased at bases otherwise unlabored. Clear. GI: moderate left upper quadrant tenderness. No diffuse peritoneal signs.  Lab Results:   Recent Labs  08/30/12 0447 08/31/12 0620  WBC 4.8 4.0  HGB 11.3* 9.0*  HCT 34.1* 27.3*  PLT 181 169   BMET  Recent Labs  08/30/12 0447 08/31/12 0620  NA 128* 128*  K 4.1 3.7  CL 86* 90*  CO2 31 29  GLUCOSE 120* 113*  BUN 11 9  CREATININE 0.58 0.53  CALCIUM 8.8 8.0*   PT/INR No results found for this basename: LABPROT, INR,  in the last 72 hours ABG  Recent Labs  08/30/12 1725  PHART 7.500*  HCO3 29.2*    Studies/Results: US Venous Img Lower Unilateral Left  08/30/2012  *RADIOLOGY REPORT*  Clinical Data: History given of left leg pain.  History of above- the-knee amputation.  History of injury from fall.  LEFT LOWER EXTREMITY VENOUS DUPLEX ULTRASOUND  Technique:  Gray-scale sonography with graded compression, as well as color Doppler and duplex ultrasound were performed to evaluate the deep venous system of the lower extremity from the level of the common femoral vein through the popliteal and proximal calf veins. Spectral Doppler was utilized to evaluate flow at rest and with distal augmentation maneuvers.  Comparison:  None.  Findings:  There is been a previous above-the-knee amputation. Normal compressibility of the  common femoral, and residual femoral veins is demonstrated . No filling defects to suggest DVT on grayscale or color Doppler imaging.  Doppler waveforms show normal direction of venous flow, normal respiratory phasicity and response to augmentation.  IMPRESSION: Post above-the-knee amputation.  No evidence of left leg deep venous thrombosis.   Original Report Authenticated By: Onalee Hua Call     Anti-infectives: Anti-infectives   Start     Dose/Rate Route Frequency Ordered Stop   08/30/12 0630  fluconazole (DIFLUCAN) IVPB 400 mg     400 mg 100 mL/hr over 120 Minutes Intravenous Daily 08/30/12 0614        Assessment/Plan: s/p * No surgery found * Splenic laceration, candidemia, fevers chills, leukocytosis. Continue antifungal coverage. Continue antibiotics. If patient persistently has fevers may require removal of port however at this time we'll continue antibiotics LOS: 6 days   Lemoine Goyne C 09/01/2012

## 2012-09-01 NOTE — Progress Notes (Signed)
  Subjective: Pain slightly increased. Still with fevers and chills  Objective: Vital signs in last 24 hours: Temp:  [99.7 F (37.6 C)-101.2 F (38.4 C)] 100.7 F (38.2 C) (02/09 0624) Pulse Rate:  [93-103] 100 (02/09 0624) Resp:  [18-19] 19 (02/09 0624) BP: (112-124)/(60-71) 118/60 mmHg (02/09 0624) SpO2:  [93 %-96 %] 96 % (02/09 0624) Last BM Date: 09/01/12  Intake/Output from previous day: 02/08 0701 - 02/09 0700 In: 3850 [P.O.:840; I.V.:3010] Out: 950 [Urine:950] Intake/Output this shift: Total I/O In: 240 [P.O.:240] Out: -   General appearance: alert and no distress Resp: clear to auscultation bilaterally Cardio: regular rate and rhythm GI: Positive bowel sounds, soft, moderate left upper quadrant tenderness. Moderate left-sided tenderness. No diffuse peritoneal signs.  Lab Results:   Recent Labs  08/30/12 0447 08/31/12 0620  WBC 4.8 4.0  HGB 11.3* 9.0*  HCT 34.1* 27.3*  PLT 181 169   BMET  Recent Labs  08/30/12 0447 08/31/12 0620  NA 128* 128*  K 4.1 3.7  CL 86* 90*  CO2 31 29  GLUCOSE 120* 113*  BUN 11 9  CREATININE 0.58 0.53  CALCIUM 8.8 8.0*   PT/INR No results found for this basename: LABPROT, INR,  in the last 72 hours ABG  Recent Labs  08/30/12 1725  PHART 7.500*  HCO3 29.2*    Studies/Results: US Venous Img Lower Unilateral Left  08/30/2012  *RADIOLOGY REPORT*  Clinical Data: History given of left leg pain.  History of above- the-knee amputation.  History of injury from fall.  LEFT LOWER EXTREMITY VENOUS DUPLEX ULTRASOUND  Technique:  Gray-scale sonography with graded compression, as well as color Doppler and duplex ultrasound were performed to evaluate the deep venous system of the lower extremity from the level of the common femoral vein through the popliteal and proximal calf veins. Spectral Doppler was utilized to evaluate flow at rest and with distal augmentation maneuvers.  Comparison:  None.  Findings:  There is been a previous  above-the-knee amputation. Normal compressibility of the common femoral, and residual femoral veins is demonstrated . No filling defects to suggest DVT on grayscale or color Doppler imaging.  Doppler waveforms show normal direction of venous flow, normal respiratory phasicity and response to augmentation.  IMPRESSION: Post above-the-knee amputation.  No evidence of left leg deep venous thrombosis.   Original Report Authenticated By: Onalee Hua Call     Anti-infectives: Anti-infectives   Start     Dose/Rate Route Frequency Ordered Stop   08/30/12 0630  fluconazole (DIFLUCAN) IVPB 400 mg     400 mg 100 mL/hr over 120 Minutes Intravenous Daily 08/30/12 0614        Assessment/Plan: s/p * No surgery found * Splenic laceration. Candidemia.  Morning labs into moderate drop in hemoglobin. We'll recheck H&H this afternoon. Low suspicion of a recurrent bleed from spleen however this is a potential possibility we'll continue to monitor closely. Indications for splenectomy were discussed with patient although at this time there is no immediate indication. Continue antibiotic coverage as well as antifungal coverage.  LOS: 7 days    Silver Achey C 09/01/2012

## 2012-09-02 LAB — CULTURE, BLOOD (ROUTINE X 2)

## 2012-09-02 LAB — CLOSTRIDIUM DIFFICILE BY PCR: Toxigenic C. Difficile by PCR: NEGATIVE

## 2012-09-02 NOTE — Progress Notes (Signed)
  Subjective: Patient states he had a productive cough yesterday.  Objective: Vital signs in last 24 hours: Temp:  [98.6 F (37 C)-102 F (38.9 C)] 98.6 F (37 C) (02/10 0512) Pulse Rate:  [90-103] 90 (02/10 0512) Resp:  [20] 20 (02/10 0512) BP: (115-126)/(57-65) 115/57 mmHg (02/10 0512) SpO2:  [90 %-100 %] 90 % (02/10 0512) Weight:  [93.214 kg (205 lb 8 oz)] 93.214 kg (205 lb 8 oz) (02/10 0512) Last BM Date: 09/02/12  Intake/Output from previous day: 02/09 0701 - 02/10 0700 In: 3102 [P.O.:1440; I.V.:1662] Out: 1650 [Urine:650; Stool:1000] Intake/Output this shift:    General appearance: alert and cooperative Resp: Occasional rhonchi in left chest. Cardio: regular rate and rhythm, S1, S2 normal, no murmur, click, rub or gallop GI: Soft, minimal tenderness noted. Colostomy patent with watery stool present. No distention noted.  Lab Results:   Recent Labs  08/31/12 0620 09/01/12 1800  WBC 4.0  --   HGB 9.0* 9.8*  HCT 27.3* 29.8*  PLT 169  --    BMET  Recent Labs  08/31/12 0620  NA 128*  K 3.7  CL 90*  CO2 29  GLUCOSE 113*  BUN 9  CREATININE 0.53  CALCIUM 8.0*   PT/INR No results found for this basename: LABPROT, INR,  in the last 72 hours  Studies/Results: No results found.  Anti-infectives: Anti-infectives   Start     Dose/Rate Route Frequency Ordered Stop   08/30/12 0630  fluconazole (DIFLUCAN) IVPB 400 mg     400 mg 100 mL/hr over 120 Minutes Intravenous Daily 08/30/12 7829        Assessment/Plan: Impression: Splenic laceration, stable Presumed pulmonary embolus, left lower lobe Candidemia, possible indwelling chronic Foley catheter as a cause, though another source may be possible. Is on Diflucan IV. Watery diarrhea Hyponatremia secondary to watery diarrhea Plan: Will check C. difficile toxin. I discussed with the patient that he may need his Port-A-Cath removed should his fevers continue. He would like to wait another 24 hours until this  decision is made.  LOS: 8 days    Nevaeh Casillas A 09/02/2012

## 2012-09-02 NOTE — Progress Notes (Addendum)
Pt c/o of very loose bowel movements starting last night.  Pt has had no fever today.  Dr. Lovell Sheehan paged.

## 2012-09-02 NOTE — Progress Notes (Signed)
Pt's CDiff negative.  Continues to have loose watery stools.  Pt has had no fever.  Dr. Lovell Sheehan paged.  Returned page and stated to continue to monitor.  He will see patient tomorrow.

## 2012-09-03 LAB — CBC
HCT: 35.6 % — ABNORMAL LOW (ref 39.0–52.0)
Hemoglobin: 11.6 g/dL — ABNORMAL LOW (ref 13.0–17.0)
MCH: 28.9 pg (ref 26.0–34.0)
RBC: 4.01 MIL/uL — ABNORMAL LOW (ref 4.22–5.81)

## 2012-09-03 LAB — BASIC METABOLIC PANEL
BUN: 6 mg/dL (ref 6–23)
CO2: 19 mEq/L (ref 19–32)
Glucose, Bld: 134 mg/dL — ABNORMAL HIGH (ref 70–99)
Potassium: 3.5 mEq/L (ref 3.5–5.1)
Sodium: 139 mEq/L (ref 135–145)

## 2012-09-03 NOTE — Progress Notes (Signed)
NUTRITION FOLLOW UP  Intervention:   1. Continue Ensure Complete mix 120 ml ice cream with each.  2. D/C Pro-Stat BID per pt request 3. RD will continue to follow  Nutrition Dx:   Inadequate oral intake related to increased needs to promote wound healing as evidenced by unhealing wounds despite multiple surgeries  Goal:   Meet >/=90% estimated nutrition needs ; not met  Monitor:   Diet advancement, weight trends, PO's, I/O's  Assessment:   Pt po intake continues to be poor 0% of breakfast this morning. He drinks the Ensure with ice cream but is still not tolerating the ProStat. Possible d/c tomorrow.   Height: Ht Readings from Last 1 Encounters:  08/25/12 4' (1.219 m)    Weight Status:   Wt Readings from Last 1 Encounters:  09/02/12 205 lb 8 oz (93.214 kg)    Re-estimated needs:  Kcal: 1900-2100 Protein: 110-130 grams Fluid: 1.9-2.1 L/day  Skin: Stage III pressure ulcers x2; Stage II pressure ulcer; Serous-filled blister   Diet Order: Dysphagia (Dys 3 with thin liquids)   Intake/Output Summary (Last 24 hours) at 09/03/12 1344 Last data filed at 09/03/12 1321  Gross per 24 hour  Intake 5493.33 ml  Output   5400 ml  Net  93.33 ml    Last BM: 09/03/12 loose watery stools, c. Diff toxin negative   Labs:   Recent Labs Lab 08/30/12 0447 08/31/12 0620 09/03/12 0458  NA 128* 128* 139  K 4.1 3.7 3.5  CL 86* 90* 105  CO2 31 29 19   BUN 11 9 6   CREATININE 0.58 0.53 0.70  CALCIUM 8.8 8.0* 8.7  GLUCOSE 120* 113* 134*    CBG (last 3)  No results found for this basename: GLUCAP,  in the last 72 hours  Scheduled Meds: . cyclobenzaprine  10 mg Oral TID  . feeding supplement  237 mL Oral TID BM  . feeding supplement  30 mL Oral BID WC  . fluconazole (DIFLUCAN) IV  400 mg Intravenous Q0600  . nicotine  21 mg Transdermal Daily  . OxyCODONE  80 mg Oral TID  . phenobarbital  32.4 mg Oral q morning - 10a  . phenobarbital  64.8 mg Oral QHS    Continuous  Infusions: . dextrose 5 % and 0.9% NaCl 100 mL/hr at 09/03/12 1320    Royann Shivers MS,RD,LDN Office: #[phone] Pager: [phone]

## 2012-09-03 NOTE — Progress Notes (Signed)
09/03/12 1852 Patient continued to have loose stools, sometimes watery, mostly yellow and loose today. Tolerating liquids, prefers sprite and vanilla ensure. States unable to tolerate anything more than liquid at this time. States had been taking ensure supplements at home prior to admission. MD aware of loose stools per report. Earnstine Regal, RN

## 2012-09-03 NOTE — Progress Notes (Signed)
UR Chart Review Completed  

## 2012-09-03 NOTE — Progress Notes (Signed)
  Subjective: Has no new complaints.  Objective: Vital signs in last 24 hours: Temp:  [97.8 F (36.6 C)-98 F (36.7 C)] 97.8 F (36.6 C) (02/10 2200) Pulse Rate:  [89-110] 110 (02/10 2200) Resp:  [18-20] 18 (02/10 2200) BP: (123-140)/(64-75) 140/75 mmHg (02/10 2200) SpO2:  [94 %-97 %] 97 % (02/10 2200) Last BM Date: 09/02/12  Intake/Output from previous day: 02/10 0701 - 02/11 0700 In: 4753.3 [P.O.:960; I.V.:3793.3] Out: 2800 [Stool:2800] Intake/Output this shift:    General appearance: alert, cooperative and no distress Resp: clear to auscultation bilaterally Cardio: regular rate and rhythm, S1, S2 normal, no murmur, click, rub or gallop GI: Soft, nondistended. No significant tenderness noted. Colostomy pink and patent.  Lab Results:   Recent Labs  09/01/12 1800 09/03/12 0458  WBC  --  5.7  HGB 9.8* 11.6*  HCT 29.8* 35.6*  PLT  --  296   BMET  Recent Labs  09/03/12 0458  NA 139  K 3.5  CL 105  CO2 19  GLUCOSE 134*  BUN 6  CREATININE 0.70  CALCIUM 8.7   PT/INR No results found for this basename: LABPROT, INR,  in the last 72 hours  Studies/Results: No results found.  Anti-infectives: Anti-infectives   Start     Dose/Rate Route Frequency Ordered Stop   08/30/12 0630  fluconazole (DIFLUCAN) IVPB 400 mg     400 mg 100 mL/hr over 120 Minutes Intravenous Daily 08/30/12 4540        Assessment/Plan: Impression: Patient has defervesced. I do not see any need to remove his Port-A-Cath at this time. It is suggested that he be on Diflucan for 4 weeks. C. difficile toxin negative. Will stop Colace and Protonix by mouth. Discharge planning is in process.  LOS: 9 days    Symphanie Cederberg A 09/03/2012

## 2012-09-04 MED ORDER — OXYCODONE HCL 80 MG PO TB12: 80.0000 mg | ORAL_TABLET | Freq: Three times a day (TID) | ORAL | Status: DC

## 2012-09-04 MED ORDER — HEPARIN SOD (PORK) LOCK FLUSH 100 UNIT/ML IV SOLN
500.0000 [IU] | INTRAVENOUS | Status: DC
  Administered 2012-09-04: 500 [IU]

## 2012-09-04 MED ORDER — OXYCODONE HCL 30 MG PO TABS: 30.0000 mg | ORAL_TABLET | ORAL | Status: DC | PRN

## 2012-09-04 MED ORDER — HEPARIN SOD (PORK) LOCK FLUSH 100 UNIT/ML IV SOLN
500.0000 [IU] | INTRAVENOUS | Status: DC | PRN
  Administered 2012-09-04: 500 [IU]
  Filled 2012-09-04: qty 5

## 2012-09-04 MED ORDER — FLUCONAZOLE 100 MG PO TABS: 100.0000 mg | ORAL_TABLET | Freq: Every day | ORAL | Status: DC

## 2012-09-04 NOTE — Discharge Summary (Signed)
Physician Discharge Summary  Patient ID: KHI MCMILLEN MRN: [id number] DOB/AGE: 28-Mar-1956 57 y.o.  Admit date: 08/25/2012 Discharge date: 09/04/2012  Admission Diagnoses: Splenic laceration secondary to fall, history of B-cell lymphoma, candidemia, chronic indwelling Foley catheter, bilateral amputee, history of colon cancer, probable left lower lobe pulmonary embolus  Discharge Diagnoses: Same Active Problems:   * No active hospital problems. *   Discharged Condition: Fair  Hospital Course: 57 year old white male with multiple medical problems who fell out of a wheelchair down 4 stairs and presented emergency room with left-sided chest and left upper quadrant abdominal pain. CT scan of the abdomen revealed a grade 2 splenic laceration. He did not have active extravasation. He had old rib fractures seen on CAT scan. He has a history of B-cell lymphoma that is not being treated at this time. He has a history of colon cancer with a permanent colostomy. He is also status post bilateral above-the-knee amputations in the remote past. He was admitted to the surgical service for further monitoring. During his stay, he developed a peripheral blood culture which was positive for yeast. It is felt that this is secondary to his chronic indwelling Foley catheter. He did defervesce on Diflucan. During a repeat CT scan the abdomen to assess the spleen, he was found to have a probable left lower lobe pulmonary embolus. He was not an anticoagulant candidate, and already has an IVC filter in place. His oxygen saturation remained reasonable. He does have a limited DO NOT RESUSCITATE, no intubation. As he has defervesced, he is being discharged home in fair and stable condition.  Discharge Exam: Blood pressure 130/74, pulse 93, temperature 99.3 F (37.4 C), temperature source Oral, resp. rate 18, height 4' (1.219 m), weight 93.214 kg (205 lb 8 oz), SpO2 97.00%. General appearance: alert, cooperative and no  distress Resp: clear to auscultation bilaterally Cardio: regular rate and rhythm, S1, S2 normal, no murmur, click, rub or gallop GI: Soft, flat. Permanent colostomy patent. Nonspecific tenderness noted in the abdomen, though significantly improved since admission.  Disposition: 06-Home-Health Care Svc     Medication List    TAKE these medications       albuterol 108 (90 BASE) MCG/ACT inhaler  Commonly known as:  PROVENTIL HFA;VENTOLIN HFA  Inhale 2 puffs into the lungs every 6 (six) hours as needed for wheezing.     ALPRAZolam 1 MG tablet  Commonly known as:  XANAX  Take 1 mg by mouth 3 (three) times daily.     carisoprodol 350 MG tablet  Commonly known as:  SOMA  Take 350 mg by mouth 4 (four) times daily.     fluconazole 100 MG tablet  Commonly known as:  DIFLUCAN  Take 1 tablet (100 mg total) by mouth daily.     lisinopril 10 MG tablet  Commonly known as:  PRINIVIL,ZESTRIL  Take 10 mg by mouth daily.     oxyCODONE 80 MG 12 hr tablet  Commonly known as:  OXYCONTIN  Take 1 tablet (80 mg total) by mouth 3 (three) times daily.     oxycodone 30 MG immediate release tablet  Commonly known as:  ROXICODONE  Take 1 tablet (30 mg total) by mouth every 4 (four) hours as needed. pain     pantoprazole 40 MG tablet  Commonly known as:  PROTONIX  Take 1 tablet (40 mg total) by mouth daily at 12 noon.     PHENobarbital 32.4 MG tablet  Commonly known as:  LUMINAL  Take 48.6-64.8  mg by mouth 2 (two) times daily. Take 1 1/2 tabs in the morning and 2 tabs at night.           Follow-up Information   Follow up with [facility].   Contact information:   [address] [place] [address] [phone]      Follow up with Dalia Heading, MD. (As needed)    Contact information:   [address] [place] [address] [phone]       Signed: Franky Macho A 09/04/2012, 9:44 AM

## 2012-09-04 NOTE — Progress Notes (Signed)
Discharge summary: a/o.vss. PAC de accessed. Discharge instructions given. Prescriptions given. Pt verbalized understanding of instructions. Left floor via wheelchair with nursing staff and family.

## 2012-09-13 ENCOUNTER — Emergency Department (HOSPITAL_COMMUNITY): Payer: Medicaid Other

## 2012-09-13 ENCOUNTER — Encounter (HOSPITAL_COMMUNITY): Payer: Self-pay | Admitting: *Deleted

## 2012-09-13 ENCOUNTER — Inpatient Hospital Stay (HOSPITAL_COMMUNITY)
Admission: EM | Admit: 2012-09-13 | Discharge: 2012-09-21 | DRG: 871 | Disposition: A | Discharge status: Skilled Nursing Facility | Payer: Medicaid Other | Attending: Internal Medicine | Admitting: Internal Medicine

## 2012-09-13 DIAGNOSIS — L8994 Pressure ulcer of unspecified site, stage 4: Secondary | ICD-10-CM

## 2012-09-13 DIAGNOSIS — Z85038 Personal history of other malignant neoplasm of large intestine: Secondary | ICD-10-CM

## 2012-09-13 DIAGNOSIS — C8589 Other specified types of non-Hodgkin lymphoma, extranodal and solid organ sites: Secondary | ICD-10-CM | POA: Diagnosis present

## 2012-09-13 DIAGNOSIS — Z89511 Acquired absence of right leg below knee: Secondary | ICD-10-CM

## 2012-09-13 DIAGNOSIS — F17200 Nicotine dependence, unspecified, uncomplicated: Secondary | ICD-10-CM | POA: Diagnosis present

## 2012-09-13 DIAGNOSIS — E46 Unspecified protein-calorie malnutrition: Secondary | ICD-10-CM

## 2012-09-13 DIAGNOSIS — Z452 Encounter for adjustment and management of vascular access device: Secondary | ICD-10-CM

## 2012-09-13 DIAGNOSIS — S42109A Fracture of unspecified part of scapula, unspecified shoulder, initial encounter for closed fracture: Secondary | ICD-10-CM | POA: Diagnosis present

## 2012-09-13 DIAGNOSIS — Z66 Do not resuscitate: Secondary | ICD-10-CM | POA: Diagnosis present

## 2012-09-13 DIAGNOSIS — Y836 Removal of other organ (partial) (total) as the cause of abnormal reaction of the patient, or of later complication, without mention of misadventure at the time of the procedure: Secondary | ICD-10-CM | POA: Diagnosis present

## 2012-09-13 DIAGNOSIS — F418 Other specified anxiety disorders: Secondary | ICD-10-CM

## 2012-09-13 DIAGNOSIS — F3289 Other specified depressive episodes: Secondary | ICD-10-CM | POA: Diagnosis present

## 2012-09-13 DIAGNOSIS — E876 Hypokalemia: Secondary | ICD-10-CM | POA: Diagnosis present

## 2012-09-13 DIAGNOSIS — W050XXA Fall from non-moving wheelchair, initial encounter: Secondary | ICD-10-CM | POA: Diagnosis present

## 2012-09-13 DIAGNOSIS — Z993 Dependence on wheelchair: Secondary | ICD-10-CM

## 2012-09-13 DIAGNOSIS — D638 Anemia in other chronic diseases classified elsewhere: Secondary | ICD-10-CM | POA: Diagnosis present

## 2012-09-13 DIAGNOSIS — Z978 Presence of other specified devices: Secondary | ICD-10-CM

## 2012-09-13 DIAGNOSIS — F329 Major depressive disorder, single episode, unspecified: Secondary | ICD-10-CM | POA: Diagnosis present

## 2012-09-13 DIAGNOSIS — A419 Sepsis, unspecified organism: Secondary | ICD-10-CM

## 2012-09-13 DIAGNOSIS — S88119A Complete traumatic amputation at level between knee and ankle, unspecified lower leg, initial encounter: Secondary | ICD-10-CM

## 2012-09-13 DIAGNOSIS — B9562 Methicillin resistant Staphylococcus aureus infection as the cause of diseases classified elsewhere: Secondary | ICD-10-CM

## 2012-09-13 DIAGNOSIS — W19XXXA Unspecified fall, initial encounter: Secondary | ICD-10-CM

## 2012-09-13 DIAGNOSIS — Z9221 Personal history of antineoplastic chemotherapy: Secondary | ICD-10-CM

## 2012-09-13 DIAGNOSIS — D649 Anemia, unspecified: Secondary | ICD-10-CM

## 2012-09-13 DIAGNOSIS — E871 Hypo-osmolality and hyponatremia: Secondary | ICD-10-CM | POA: Diagnosis present

## 2012-09-13 DIAGNOSIS — Z79899 Other long term (current) drug therapy: Secondary | ICD-10-CM

## 2012-09-13 DIAGNOSIS — A4102 Sepsis due to Methicillin resistant Staphylococcus aureus: Principal | ICD-10-CM | POA: Diagnosis present

## 2012-09-13 DIAGNOSIS — T879 Unspecified complications of amputation stump: Secondary | ICD-10-CM | POA: Diagnosis present

## 2012-09-13 DIAGNOSIS — S300XXA Contusion of lower back and pelvis, initial encounter: Secondary | ICD-10-CM | POA: Diagnosis present

## 2012-09-13 DIAGNOSIS — Z86711 Personal history of pulmonary embolism: Secondary | ICD-10-CM

## 2012-09-13 DIAGNOSIS — J9601 Acute respiratory failure with hypoxia: Secondary | ICD-10-CM

## 2012-09-13 DIAGNOSIS — Z933 Colostomy status: Secondary | ICD-10-CM

## 2012-09-13 DIAGNOSIS — N390 Urinary tract infection, site not specified: Secondary | ICD-10-CM

## 2012-09-13 DIAGNOSIS — G40909 Epilepsy, unspecified, not intractable, without status epilepticus: Secondary | ICD-10-CM

## 2012-09-13 DIAGNOSIS — L89309 Pressure ulcer of unspecified buttock, unspecified stage: Secondary | ICD-10-CM | POA: Diagnosis present

## 2012-09-13 DIAGNOSIS — B377 Candidal sepsis: Secondary | ICD-10-CM | POA: Diagnosis present

## 2012-09-13 DIAGNOSIS — G8929 Other chronic pain: Secondary | ICD-10-CM | POA: Diagnosis present

## 2012-09-13 DIAGNOSIS — F4310 Post-traumatic stress disorder, unspecified: Secondary | ICD-10-CM | POA: Diagnosis present

## 2012-09-13 DIAGNOSIS — S2239XA Fracture of one rib, unspecified side, initial encounter for closed fracture: Secondary | ICD-10-CM | POA: Diagnosis present

## 2012-09-13 DIAGNOSIS — G9340 Encephalopathy, unspecified: Secondary | ICD-10-CM

## 2012-09-13 HISTORY — DX: Other chronic pain: G89.29

## 2012-09-13 HISTORY — DX: Post-traumatic stress disorder, unspecified: F43.10

## 2012-09-13 HISTORY — DX: Unspecified fall, initial encounter: W19.XXXA

## 2012-09-13 HISTORY — DX: Anemia, unspecified: D64.9

## 2012-09-13 HISTORY — DX: Acute myocardial infarction, unspecified: I21.9

## 2012-09-13 HISTORY — DX: Acquired absence of left leg below knee: Z89.512

## 2012-09-13 HISTORY — DX: Encephalopathy, unspecified: G93.40

## 2012-09-13 HISTORY — DX: Acquired absence of right leg below knee: Z89.511

## 2012-09-13 HISTORY — DX: Major depressive disorder, single episode, unspecified: F32.9

## 2012-09-13 HISTORY — DX: Pressure ulcer of unspecified site, stage 4: L89.94

## 2012-09-13 HISTORY — DX: Essential (primary) hypertension: I10

## 2012-09-13 HISTORY — DX: Sepsis, unspecified organism: A41.9

## 2012-09-13 NOTE — ED Notes (Signed)
Pt fell between wheelchair and bed. C/O left shoulder pain and left sided pain. Pt is from home.

## 2012-09-13 NOTE — ED Notes (Signed)
Bed:WA15<BR> Expected date:<BR> Expected time:<BR> Means of arrival:<BR> Comments:<BR> EMS

## 2012-09-14 ENCOUNTER — Inpatient Hospital Stay (HOSPITAL_COMMUNITY): Payer: Medicaid Other

## 2012-09-14 ENCOUNTER — Encounter (HOSPITAL_COMMUNITY): Payer: Self-pay | Admitting: Internal Medicine

## 2012-09-14 DIAGNOSIS — R109 Unspecified abdominal pain: Secondary | ICD-10-CM

## 2012-09-14 DIAGNOSIS — R079 Chest pain, unspecified: Secondary | ICD-10-CM

## 2012-09-14 LAB — CBC WITH DIFFERENTIAL/PLATELET
Eosinophils Absolute: 0.2 10*3/uL (ref 0.0–0.7)
Eosinophils Relative: 2 % (ref 0–5)
HCT: 29.4 % — ABNORMAL LOW (ref 39.0–52.0)
Hemoglobin: 9.7 g/dL — ABNORMAL LOW (ref 13.0–17.0)
Lymphocytes Relative: 6 % — ABNORMAL LOW (ref 12–46)
Lymphs Abs: 0.6 10*3/uL — ABNORMAL LOW (ref 0.7–4.0)
MCH: 27.9 pg (ref 26.0–34.0)
MCV: 84.5 fL (ref 78.0–100.0)
Monocytes Relative: 4 % (ref 3–12)
RBC: 3.48 MIL/uL — ABNORMAL LOW (ref 4.22–5.81)
WBC: 8.7 10*3/uL (ref 4.0–10.5)

## 2012-09-14 LAB — COMPREHENSIVE METABOLIC PANEL
ALT: 27 U/L (ref 0–53)
AST: 30 U/L (ref 0–37)
Alkaline Phosphatase: 202 U/L — ABNORMAL HIGH (ref 39–117)
Alkaline Phosphatase: 218 U/L — ABNORMAL HIGH (ref 39–117)
BUN: 7 mg/dL (ref 6–23)
BUN: 6 mg/dL (ref 6–23)
CO2: 32 mEq/L (ref 19–32)
CO2: 33 mEq/L — ABNORMAL HIGH (ref 19–32)
Calcium: 7.6 mg/dL — ABNORMAL LOW (ref 8.4–10.5)
Chloride: 89 mEq/L — ABNORMAL LOW (ref 96–112)
Creatinine, Ser: 0.54 mg/dL (ref 0.50–1.35)
GFR calc Af Amer: >90 mL/min (ref >90)
GFR calc non Af Amer: >90 mL/min (ref >90)
GFR calc non Af Amer: >90 mL/min (ref >90)
Glucose, Bld: 105 mg/dL — ABNORMAL HIGH (ref 70–99)
Potassium: 3.1 mEq/L — ABNORMAL LOW (ref 3.5–5.1)
Sodium: 128 mEq/L — ABNORMAL LOW (ref 135–145)
Total Bilirubin: 1.1 mg/dL (ref 0.3–1.2)
Total Protein: 6.9 g/dL (ref 6.0–8.3)

## 2012-09-14 LAB — URINALYSIS, ROUTINE W REFLEX MICROSCOPIC
Glucose, UA: NEGATIVE mg/dL
Hgb urine dipstick: NEGATIVE
Specific Gravity, Urine: 1.016 (ref 1.005–1.030)
pH: 7 (ref 5.0–8.0)

## 2012-09-14 LAB — URINALYSIS, MICROSCOPIC ONLY
Glucose, UA: NEGATIVE mg/dL
Ketones, ur: NEGATIVE mg/dL
pH: 6.5 (ref 5.0–8.0)

## 2012-09-14 LAB — URINE MICROSCOPIC-ADD ON

## 2012-09-14 LAB — CK: Total CK: 114 U/L (ref 7–232)

## 2012-09-14 MED ORDER — PHENOBARBITAL 32.4 MG PO TABS
64.8000 mg | ORAL_TABLET | Freq: Every day | ORAL | Status: DC
  Administered 2012-09-14 – 2012-09-20 (×7): 64.8 mg via ORAL
  Filled 2012-09-14 (×7): qty 2

## 2012-09-14 MED ORDER — OXYCODONE HCL 5 MG PO TABS
5.0000 mg | ORAL_TABLET | ORAL | Status: DC | PRN
  Administered 2012-09-14 – 2012-09-15 (×2): 5 mg via ORAL
  Filled 2012-09-14 (×2): qty 1

## 2012-09-14 MED ORDER — SODIUM CHLORIDE 0.9 % IV SOLN
3.0000 g | Freq: Four times a day (QID) | INTRAVENOUS | Status: DC
  Administered 2012-09-14 – 2012-09-16 (×5): 3 g via INTRAVENOUS
  Filled 2012-09-14 (×9): qty 3

## 2012-09-14 MED ORDER — IOHEXOL 300 MG/ML  SOLN
100.0000 mL | Freq: Once | INTRAMUSCULAR | Status: AC | PRN
  Administered 2012-09-14: 100 mL via INTRAVENOUS

## 2012-09-14 MED ORDER — ACETAMINOPHEN 325 MG PO TABS
650.0000 mg | ORAL_TABLET | Freq: Four times a day (QID) | ORAL | Status: DC | PRN
  Administered 2012-09-17 – 2012-09-18 (×3): 650 mg via ORAL
  Filled 2012-09-14 (×3): qty 2

## 2012-09-14 MED ORDER — HYDROMORPHONE HCL PF 1 MG/ML IJ SOLN
1.0000 mg | INTRAMUSCULAR | Status: DC | PRN
  Administered 2012-09-14 – 2012-09-18 (×20): 1 mg via INTRAVENOUS
  Filled 2012-09-14 (×20): qty 1

## 2012-09-14 MED ORDER — LEVOFLOXACIN IN D5W 750 MG/150ML IV SOLN
750.0000 mg | INTRAVENOUS | Status: DC
  Administered 2012-09-14: 750 mg via INTRAVENOUS
  Filled 2012-09-14 (×2): qty 150

## 2012-09-14 MED ORDER — ONDANSETRON HCL 4 MG/2ML IJ SOLN
4.0000 mg | Freq: Once | INTRAMUSCULAR | Status: AC
  Administered 2012-09-14: 4 mg via INTRAVENOUS
  Filled 2012-09-14: qty 2

## 2012-09-14 MED ORDER — PANTOPRAZOLE SODIUM 40 MG PO TBEC
40.0000 mg | DELAYED_RELEASE_TABLET | Freq: Every day | ORAL | Status: DC
  Administered 2012-09-14 – 2012-09-21 (×6): 40 mg via ORAL
  Filled 2012-09-14 (×8): qty 1

## 2012-09-14 MED ORDER — VANCOMYCIN HCL IN DEXTROSE 1-5 GM/200ML-% IV SOLN
1000.0000 mg | Freq: Two times a day (BID) | INTRAVENOUS | Status: DC
  Administered 2012-09-14 – 2012-09-16 (×4): 1000 mg via INTRAVENOUS
  Filled 2012-09-14 (×6): qty 200

## 2012-09-14 MED ORDER — HYDROMORPHONE HCL PF 2 MG/ML IJ SOLN
2.0000 mg | Freq: Once | INTRAMUSCULAR | Status: AC
  Administered 2012-09-14: 2 mg via INTRAVENOUS
  Filled 2012-09-14: qty 1

## 2012-09-14 MED ORDER — SODIUM CHLORIDE 0.9 % IJ SOLN
3.0000 mL | Freq: Two times a day (BID) | INTRAMUSCULAR | Status: DC
  Administered 2012-09-14: 3 mL via INTRAVENOUS

## 2012-09-14 MED ORDER — ALPRAZOLAM 0.5 MG PO TABS
0.5000 mg | ORAL_TABLET | Freq: Three times a day (TID) | ORAL | Status: DC | PRN
  Administered 2012-09-14 – 2012-09-16 (×5): 0.5 mg via ORAL
  Filled 2012-09-14 (×5): qty 1

## 2012-09-14 MED ORDER — ACETAMINOPHEN 325 MG PO TABS
650.0000 mg | ORAL_TABLET | Freq: Once | ORAL | Status: AC
  Administered 2012-09-14: 650 mg via ORAL
  Filled 2012-09-14: qty 1

## 2012-09-14 MED ORDER — ALBUTEROL SULFATE (5 MG/ML) 0.5% IN NEBU
5.0000 mg | INHALATION_SOLUTION | Freq: Once | RESPIRATORY_TRACT | Status: AC
  Administered 2012-09-14: 5 mg via RESPIRATORY_TRACT

## 2012-09-14 MED ORDER — ALBUTEROL SULFATE (5 MG/ML) 0.5% IN NEBU
INHALATION_SOLUTION | RESPIRATORY_TRACT | Status: AC
  Filled 2012-09-14: qty 1

## 2012-09-14 MED ORDER — ACETAMINOPHEN 650 MG RE SUPP: 650.0000 mg | Freq: Four times a day (QID) | RECTAL | Status: DC | PRN

## 2012-09-14 MED ORDER — HYDROCODONE-ACETAMINOPHEN 5-325 MG PO TABS
1.0000 | ORAL_TABLET | Freq: Once | ORAL | Status: AC
  Administered 2012-09-14: 1 via ORAL
  Filled 2012-09-14: qty 1

## 2012-09-14 MED ORDER — PHENOBARBITAL 32.4 MG PO TABS
32.4000 mg | ORAL_TABLET | Freq: Every day | ORAL | Status: DC
  Administered 2012-09-14 – 2012-09-21 (×8): 32.4 mg via ORAL
  Filled 2012-09-14 (×8): qty 1

## 2012-09-14 MED ORDER — FLUCONAZOLE 100 MG PO TABS
100.0000 mg | ORAL_TABLET | Freq: Every day | ORAL | Status: DC
  Administered 2012-09-14 – 2012-09-16 (×3): 100 mg via ORAL
  Filled 2012-09-14 (×3): qty 1

## 2012-09-14 MED ORDER — PHENOBARBITAL 32.4 MG PO TABS: 32.4000 mg | ORAL_TABLET | Freq: Two times a day (BID) | ORAL | Status: DC

## 2012-09-14 MED ORDER — ALPRAZOLAM 0.5 MG PO TABS
1.0000 mg | ORAL_TABLET | Freq: Once | ORAL | Status: AC
  Administered 2012-09-14: 1 mg via ORAL
  Filled 2012-09-14 (×2): qty 1

## 2012-09-14 MED ORDER — POTASSIUM CHLORIDE CRYS ER 20 MEQ PO TBCR
40.0000 meq | EXTENDED_RELEASE_TABLET | Freq: Once | ORAL | Status: AC
  Administered 2012-09-14: 40 meq via ORAL
  Filled 2012-09-14 (×2): qty 1

## 2012-09-14 MED ORDER — FUROSEMIDE 10 MG/ML IJ SOLN
20.0000 mg | Freq: Once | INTRAMUSCULAR | Status: AC
  Administered 2012-09-14: 20 mg via INTRAVENOUS
  Filled 2012-09-14: qty 4

## 2012-09-14 MED ORDER — ALBUTEROL SULFATE HFA 108 (90 BASE) MCG/ACT IN AERS: 2.0000 | INHALATION_SPRAY | Freq: Four times a day (QID) | RESPIRATORY_TRACT | Status: DC | PRN

## 2012-09-14 MED ORDER — IPRATROPIUM BROMIDE 0.02 % IN SOLN
0.5000 mg | Freq: Once | RESPIRATORY_TRACT | Status: AC
  Administered 2012-09-14: 0.5 mg via RESPIRATORY_TRACT

## 2012-09-14 MED ORDER — SODIUM CHLORIDE 0.9 % IV BOLUS (SEPSIS)
1000.0000 mL | Freq: Once | INTRAVENOUS | Status: AC
  Administered 2012-09-14: 1000 mL via INTRAVENOUS

## 2012-09-14 MED ORDER — POTASSIUM CHLORIDE CRYS ER 20 MEQ PO TBCR
40.0000 meq | EXTENDED_RELEASE_TABLET | Freq: Two times a day (BID) | ORAL | Status: AC
  Administered 2012-09-14 – 2012-09-15 (×2): 40 meq via ORAL
  Filled 2012-09-14 (×2): qty 2

## 2012-09-14 MED ORDER — ONDANSETRON HCL 4 MG PO TABS: 4.0000 mg | ORAL_TABLET | Freq: Four times a day (QID) | ORAL | Status: DC | PRN

## 2012-09-14 MED ORDER — ONDANSETRON HCL 4 MG/2ML IJ SOLN: 4.0000 mg | Freq: Four times a day (QID) | INTRAMUSCULAR | Status: DC | PRN

## 2012-09-14 MED ORDER — LISINOPRIL 10 MG PO TABS
10.0000 mg | ORAL_TABLET | Freq: Every morning | ORAL | Status: DC
  Administered 2012-09-14: 10 mg via ORAL
  Filled 2012-09-14: qty 1

## 2012-09-14 MED ORDER — DEXTROSE 5 % IV SOLN
1.0000 g | Freq: Once | INTRAVENOUS | Status: AC
  Administered 2012-09-14: 1 g via INTRAVENOUS
  Filled 2012-09-14: qty 10

## 2012-09-14 MED ORDER — SODIUM CHLORIDE 0.9 % IV SOLN: Freq: Once | INTRAVENOUS | Status: DC

## 2012-09-14 MED ORDER — SODIUM CHLORIDE 0.9 % IJ SOLN: 3.0000 mL | INTRAMUSCULAR | Status: DC | PRN

## 2012-09-14 MED ORDER — SODIUM CHLORIDE 0.9 % IV SOLN: 250.0000 mL | INTRAVENOUS | Status: DC | PRN

## 2012-09-14 MED ORDER — IPRATROPIUM BROMIDE 0.02 % IN SOLN
RESPIRATORY_TRACT | Status: AC
  Filled 2012-09-14: qty 5

## 2012-09-14 NOTE — ED Notes (Signed)
Pt alert x 4 v/s stable. Three stage 2 wounds to sacral area noted with  Indention  and yellowish/ greenish  drainage with odor noted.  Washed wound with warm soapy water and applied allevyn dressing. Stage 2 also noted to lf lower  stump allevyn dressing applied . Pt tol dressing change well will monitor

## 2012-09-14 NOTE — ED Provider Notes (Signed)
Assumed care of patient from [facility], PA-C.  Patient is currently awaiting Social Work consult for placement.  Plan is for the patient to be admitted to the hospital if social work can not find placement.  Patient recently admitted for fall.  Patient had another fall last evening.  Xrays performed today did not show any new injuries.  Patient with bilateral AKA.  Patient found to have a UTI today.  Patient given 1 gram Rocephin and Foley catheter replaced.  10:19 AM RN has contacted Social Work.  She reports that she is going to come see patient in the ED, but has several discharges on the floor that she will have to do first.    Patient has been admitted by Dr. Adriana Simas to [facility].  Pascal Lux [place], PA-C 09/14/12 1642

## 2012-09-14 NOTE — ED Provider Notes (Signed)
Medical screening examination/treatment/procedure(s) were conducted as a shared visit with non-physician practitioner(s) and myself.  I personally evaluated the patient during the encounter.  Patient with multiple medical problems recently admitted for fall. Patient was sent home to live with family, and does not like his current living situation. He reports another fall tonight. He does not have adequate supervision at home. He is requesting placement in a new skilled nursing facility. His workup today has not shown any new injury, he does have a urinary tract infection noted. He will be treated for this. Patient has hyponatremia, has had same in the past.  Olivia Mackie, MD 09/14/12 [phone]

## 2012-09-14 NOTE — H&P (Addendum)
[facility] History and Physical  [name] AVW:[id number] DOB: 10-02-1955 DOA: 09/13/2012  Referring physician: Dr Adriana Simas. PCP: [name], MD  Specialists: None.  Chief Complaint: Fall, left side pain abdomen, chest   HPI: Chris [name] is a 57 y.o. male with PMH significant for bilateral amputee, history of colon cancer s/p colostomy, seizure disorder, chronic indwelling Foley catheter, H/O PE, s/p IVC filter, recent admission at [facility] after a fall and subsequent diagnosed with spleen laceration 08-25-2012, VRE, B-cell lymphoma no on chemotherapy who presents after a fall. He was trying to transfer himself from his bed to his wheelchair and fell in between hitting the bed rail. He hit his left side chest, abdomen. He has pain left side since then. He denies SOB. He has been coughing phlegm per nurse report. Patient has been in the ED since 2-21 11 PM, waiting for SW evaluation, placement.  Patient spike a tempeture at 102 on 09-14-2012. He has require more oxygen. His UA  show too numerous too count WBC. He received 1 dose of ceftriaxone. He became very sleepy at some point in the ED after receiving IV dilaudid. He is now awake, able to provide history. He is now appropriate, oriented times 3. He wants to be DNI, DNR. His nice left him alone at home. He will need SNF placement. He received 2 L of IV fluids in the ED.       Review of Systems: The patient denies anorexia, fever, weight loss,, vision loss, decreased hearing, hoarseness, chest pain, syncope,peripheral edema, balance deficits, hemoptysis, melena, hematochezia, severe indigestion/heartburn, hematuria, incontinence, genital sores, muscle weakness, suspicious skin lesions, transient blindness, difficulty walking, depression,  Past Medical History  Diagnosis Date  . Colon cancer   . Seizures   . Presence of IVC filter 2011   Past Surgical History  Procedure Laterality Date  . Colostomy     Social  History:  Denies alcohol or smoking. No recreational drugs. Was living with nice who left him alone at the house for 5 days.    Allergies  Allergen Reactions  . Fish Allergy Anaphylaxis    Pt can tolerate shellfish  . Ibuprofen Other (See Comments)    hallucinations  . Ketorolac Tromethamine Other (See Comments)    hallucination  . Naproxen Other (See Comments)    hallucination    Family History  Problem Relation Age of Onset  . Heart failure Sister     Prior to Admission medications   Medication Sig Start Date End Date Taking? Authorizing Provider  albuterol (PROVENTIL HFA;VENTOLIN HFA) 108 (90 BASE) MCG/ACT inhaler Inhale 2 puffs into the lungs every 6 (six) hours as needed for wheezing. 11/20/11 11/19/12 Yes Nishant Dhungel, MD  ALPRAZolam (XANAX) 1 MG tablet Take 1 mg by mouth 3 (three) times daily.   Yes Historical Provider, MD  carisoprodol (SOMA) 350 MG tablet Take 350 mg by mouth 3 (three) times daily.    Yes Historical Provider, MD  fluconazole (DIFLUCAN) 100 MG tablet Take 1 tablet (100 mg total) by mouth daily. 09/04/12  Yes Dalia Heading, MD  lisinopril (PRINIVIL,ZESTRIL) 10 MG tablet Take 10 mg by mouth every morning.    Yes Historical Provider, MD  oxyCODONE (OXYCONTIN) 80 MG 12 hr tablet Take 1 tablet (80 mg total) by mouth 3 (three) times daily. 09/04/12  Yes Dalia Heading, MD  oxycodone (ROXICODONE) 30 MG immediate release tablet Take 30 mg by mouth every 3 (three) hours as needed (for breakthrough  pain). pain 09/04/12  Yes Dalia Heading, MD  pantoprazole (PROTONIX) 40 MG tablet Take 1 tablet (40 mg total) by mouth daily at 12 noon. 11/20/11 11/19/12 Yes Nishant Dhungel, MD  PHENobarbital (LUMINAL) 32.4 MG tablet Take 32.4-64.8 mg by mouth 2 (two) times daily. Take 1 tablet in the morning and 2 tablets at in the evening   Yes Historical Provider, MD   Physical Exam: Filed Vitals:   09/14/12 0952 09/14/12 1130 09/14/12 1133 09/14/12 1159  BP: 109/69 102/57  102/57   Pulse: 113  98 104  Temp: 102.8 F (39.3 C) 99.7 F (37.6 C)    TempSrc: Oral Oral    Resp: 20 25 29 23   SpO2: 92% 96% 95% 94%   General Appearance:    Alert, cooperative, mild respiratory  distress, appears stated age  Head:    Normocephalic, without obvious abnormality, atraumatic  Eyes:    PERRL, conjunctiva/corneas clear, EOM's intact,         Ears:    Normal TM's and external ear canals, both ears  Nose:   Nares normal, septum midline, mucosa normal, no drainage    or sinus tenderness  Throat:   Lips, mucosa, and tongue normal; teeth and gums normal  Neck:   Supple, symmetrical, trachea midline, no adenopathy;       thyroid:  No enlargement/tenderness/nodules; no carotid   bruit or JVD  Back:     Symmetric, no curvature, ROM normal, no CVA tenderness  Lungs:     Bilateral crackles, ronchus  Chest wall:    No tenderness or deformity  Heart:    Regular rate and rhythm, S1 and S2 normal, no murmur, rub   or gallop  Abdomen:     Soft, non-tender, bowel sounds active all four quadrants,    no masses, no organomegaly, colostomy in place.         Extremities:   Bilateral Above knee amputee, he has small ulcer left stump.      Skin:   Skin color, texture, turgor normal, no rashes or lesions  Lymph nodes:   Cervical, supraclavicular, and axillary nodes normal  Neurologic:   CNII-XII intact. Normal strength upper extremities, Awake, answering question appropriate.       Labs on Admission:  Basic Metabolic Panel:  Recent Labs Lab 09/14/12 0120  NA 128*  K 3.0*  CL 85*  CO2 32  GLUCOSE 105*  BUN 7  CREATININE 0.50  CALCIUM 7.6*   Liver Function Tests:  Recent Labs Lab 09/14/12 0120  AST 27  ALT 27  ALKPHOS 202*  BILITOT 1.2  PROT 6.9  ALBUMIN 1.5*   CBC:  Recent Labs Lab 09/14/12 0120  WBC 8.7  NEUTROABS 7.7  HGB 9.7*  HCT 29.4*  MCV 84.5  PLT 268   Cardiac Enzymes:  Recent Labs Lab 09/14/12 0120  CKTOTAL 114    BNP (last 3 results)  Recent  Labs  11/09/11 1510 11/09/11 1858  PROBNP 393.9* 1229.0*   CBG: No results found for this basename: GLUCAP,  in the last 168 hours  Radiological Exams on Admission: Dg Chest 1 View  09/14/2012  *RADIOLOGY REPORT*  Clinical Data: Left-sided chest pain after fall.  CHEST - 1 VIEW  Comparison: 08/30/2012  Findings: Stable appearance of right-sided central venous catheter. Shallow inspiration with elevation right hemidiaphragm.  Possible fluid or thickened pleura in the right costophrenic angle.  Heart size and pulmonary vascularity are normal.  No focal consolidation or airspace disease.  No pneumothorax.  Old left rib fractures. Postoperative changes in the left shoulder.  Stable appearance since previous study.  IMPRESSION: No evidence of active pulmonary disease.   Original Report Authenticated By: Burman Nieves, M.D.    Dg Shoulder Left  09/14/2012  **ADDENDUM** CREATED: 09/14/2012 02:35:43  The focal irregularity of the tip of the scapula was present on previous CT of the chest dated 08/25/2012 and is therefore not a new finding since that time.  **END ADDENDUM** SIGNED BY: Marlon Pel, M.D.   09/14/2012  *RADIOLOGY REPORT*  Clinical Data: Left shoulder pain after fall.  LEFT SHOULDER - 2+ VIEW  Comparison: 08/25/2012  Findings: Postoperative changes in the left proximal humerus with plate and screw fixation across to old fracture deformity with callus formation.  Mild residual varus angulation.  No significant change since previous study.  Surgical clips in the axilla.  There is cortical irregularity at the distal aspect of the scapula.  This area was not well seen on the previous study but was not definitely present.  No acute scapular fractures not excluded.  Correlation with the location of the patient's pain is recommended.  The proximal humerus, glenohumeral joint, acromioclavicular joint, and coracoclavicular space appear intact.  Visualized ribs are not displaced.  No focal bone  lesion or bone destruction.  IMPRESSION: Old postoperative and post-traumatic changes in the proximal humerus.  Possible fracture of the inferior aspect of the scapula.   Original Report Authenticated By: Burman Nieves, M.D.      Assessment/Plan Active Problems:   Encephalopathy acute   Seizure disorder   Acute respiratory failure with hypoxia   Hyponatremia   Hypokalemia   S/P BKA (below knee amputation) bilateral   1-UTI: patient with risk factors for UTI. UA with too numerous to count wbc. He has history of VRE. Per prior culture sensitive to ampicillin. I will start Unasyn per pharmacy to dose. Urine culture. Will order blood culture. Lactic acid.  2-Acute respiratory failure: Patient requiring 4 L oxygen. Patient relates cough. Will repeat chest x ray. Evaluate for PNA, pulmonary edema. NSL. Treat for PNA. Vancomycin and Unasyn. Sputum culture, blood culture. Admit to step down unit. If needed could consider BIPAP.  3-Hyponatremia: probably related to de dehydration. Received 2 L in the ED.  Repeat B-met.  4;Hypokalemia: received 1 dose Kcl. Repeat B-met pending.  5-Anemia: Hb baseline 9 to 11. Repeat hb in am.  6-Fall: will order CT abdomen and pelvis, patient complaining  of pain. Evaluate spleen.  7-Chronic pain: he is on very high dose narcotic. He became very sedated after dilaudid given in the ED. I will hold home dose medication. Will do oxycodone low dose prn. IV dilaudid low dose as needed. Hold for sedation.  8-Seizure disorder: Continue with phenobarbital.  9-Decubitus wound stage 2, small ulcer left stump: wound care consulted.     Code Status: Per patient whishes DNI/DNR. Prior records confirm DNI. DNR status.  Family Communication: Care discussed with patient.  Disposition Plan: Admit to hospital expect 3 to 4 days. PT evaluation. Need SNF placement.   Time spent: more than 60 minutes.   Toshio Slusher [facility] Pager [phone]  If 7PM-7AM, please  contact night-coverage [email or web address] Password [facility] 09/14/2012, 2:33 PM

## 2012-09-14 NOTE — ED Notes (Signed)
38. Per Kyung Rudd, PA, okay to access portacath for patient to administer IV fluids. RN in to access port, pt says he refuses to have his port accessed unless he will be receiving pain medication via IV. Spoke with Kyung Rudd, PA, okay to not access port a cath and pt to drink IVF at this time. Offered patient fluids, drinking gingerale at this time.

## 2012-09-14 NOTE — ED Notes (Signed)
In to administer breathing treatment, pt refuses to have breathing treatment until his port is accessed and he receives pain medication

## 2012-09-14 NOTE — ED Notes (Signed)
PA in to discuss plan of care with patient. Patient reports that he has not had his pain medication in 2 days since his niece has not been at home. Reports being on pain meds for chronic pain issues

## 2012-09-14 NOTE — ED Notes (Signed)
Patient arrives via EMS. He reports that he lives with his niece but she has not been home in 2 days. He reports that he has not had his dressing changed and has not been eating or drinking. He says he fell trying to transfer from the w/c to the bed and fell against the bed rails.

## 2012-09-14 NOTE — Progress Notes (Signed)
Clarified with patient DNR status.  Does not wish to receive pressors or use Bipap if necessary.  Verbalizes understanding on what DNR means.  States he "is ready to die".  Will continue to monitor.

## 2012-09-14 NOTE — Progress Notes (Signed)
ANTIBIOTIC CONSULT NOTE - INITIAL  Pharmacy Consult for Vancomycin/Unasyn Indication: Pneuomonia/UTI  Allergies  Allergen Reactions  . Fish Allergy Anaphylaxis    Pt can tolerate shellfish  . Ibuprofen Other (See Comments)    hallucinations  . Ketorolac Tromethamine Other (See Comments)    hallucination  . Naproxen Other (See Comments)    hallucination    Patient Measurements:   Adjusted Body Weight:   Vital Signs: Temp: 99.7 F (37.6 C) (02/22 1130) Temp src: Oral (02/22 1130) BP: 102/57 mmHg (02/22 1159) Pulse Rate: 104 (02/22 1159) Intake/Output from previous day:   Intake/Output from this shift: Total I/O In: 140 [P.O.:120; I.V.:20] Out: 450 [Urine:450]  Labs:  Recent Labs  09/14/12 0120  WBC 8.7  HGB 9.7*  PLT 268  CREATININE 0.50   The CrCl is unknown because both a height and weight (above a minimum accepted value) are required for this calculation. No results found for this basename: VANCOTROUGH, VANCOPEAK, VANCORANDOM, GENTTROUGH, GENTPEAK, GENTRANDOM, TOBRATROUGH, TOBRAPEAK, TOBRARND, AMIKACINPEAK, AMIKACINTROU, AMIKACIN,  in the last 72 hours   Microbiology: No results found for this or any previous visit (from the past 720 hour(s)).  Medical History: Past Medical History  Diagnosis Date  . Colon cancer   . Seizures   . Presence of IVC filter 2011    Medications:  PTA: Fluconazole 100mg  po daily  Assessment: 89 yoM with recent splenic laceration 2/2 fall and PMHx pertinent for B-cell lymphoma, colon CA, candidemia,  BL BKA, chronic indwelling foley, seizure disorder, history of VRE and MRSA infections presents to ED after fall at home.  Pt noted to have stage 3 sacral wounds and stage 2 left stump wounds as well as UTI, and ?PNA.  Pharmacy has been asked to dose Vancomycin and Unasyn.  Fluconazole 100mg  po daily resumed from PTA medications.    Tm24h: 102.8  WBC 8.7  SCr 0.50, CrCl >100 (CG and N) however pt is BL amputee therefore CrCl  may be overestimated  Cultures: 2/22 blood and urine cultures pending  Weight: 93.2kg (from 09/02/12), Height: 48" (BL amputee)  Spoke with Dr. Sunnie Nielsen who is covering for HCAP PNA and UTI (hx VRE UTI 10/2011) and will add on levaquin for HCAP.   Goal of Therapy:  Vancomycin trough level 15-20 mcg/ml  Plan:  1.  Vancomycin 1 gram IV q 12 hours 2.  Unasyn 3g IV q 6 hours 3.  F/u renal function, cultures, T, WBC, vancomycin trough at steady state  Haynes Hoehn, PharmD 09/14/2012 4:23 PM  Pager: [phone]

## 2012-09-14 NOTE — ED Provider Notes (Signed)
History     CSN: [id number]  Arrival date & time 09/13/12  2259   First MD Initiated Contact with Patient 09/14/12 [phone]      Chief Complaint  Patient presents with  . Fall   HPI  History provided by the patient. Patient is a 57 year old male with history of recent splenic laceration secondary to fall, history of B-cell lymphoma, candidemia, chronic indwelling Foley catheter, bilateral amputee, history of colon cancer, probable left lower lobe pulmonary embolus who presents with complaints of a fall. Patient states that he was trying to transfer himself from his bed to his wheelchair and fell in between hitting the bed rail. Patient states he was pinned to the area for over one hour. Patient was seen after a recent fall at any pain hospital earlier this month. Patient states that he was originally living with one of his sisters and [place] but recently moved to be with another sister over the past one to 2 months. He currently does not have a primary care physician. Patient is also requesting to be placed in a skilled nursing facility and states that he cannot return to live with his other sister.     Past Medical History  Diagnosis Date  . Colon cancer   . Seizures   . Presence of IVC filter 2011    Past Surgical History  Procedure Laterality Date  . Colostomy      Family History  Problem Relation Age of Onset  . Heart failure Sister     History  Substance Use Topics  . Smoking status: Current Some Day Smoker  . Smokeless tobacco: Not on file  . Alcohol Use: 1.5 oz/week    3 drink(s) per week     Comment: used to per pt.       Review of Systems  Constitutional: Negative for fever.  HENT: Negative for neck pain.   Respiratory: Negative for shortness of breath.   Cardiovascular: Positive for chest pain.  Musculoskeletal:       Shoulder pain  Neurological: Negative for headaches.  All other systems reviewed and are negative.    Allergies  Fish allergy;  Ibuprofen; Ketorolac tromethamine; and Naproxen  Home Medications   Current Outpatient Rx  Name  Route  Sig  Dispense  Refill  . albuterol (PROVENTIL HFA;VENTOLIN HFA) 108 (90 BASE) MCG/ACT inhaler   Inhalation   Inhale 2 puffs into the lungs every 6 (six) hours as needed for wheezing.   1 Inhaler   2   . ALPRAZolam (XANAX) 1 MG tablet   Oral   Take 1 mg by mouth 3 (three) times daily.         . carisoprodol (SOMA) 350 MG tablet   Oral   Take 350 mg by mouth 3 (three) times daily.          . fluconazole (DIFLUCAN) 100 MG tablet   Oral   Take 1 tablet (100 mg total) by mouth daily.   21 tablet   0   . lisinopril (PRINIVIL,ZESTRIL) 10 MG tablet   Oral   Take 10 mg by mouth every morning.          Marland Kitchen oxyCODONE (OXYCONTIN) 80 MG 12 hr tablet   Oral   Take 1 tablet (80 mg total) by mouth 3 (three) times daily.   30 tablet   0   . oxycodone (ROXICODONE) 30 MG immediate release tablet   Oral   Take 30 mg by mouth every 3 (three)  hours as needed (for breakthrough pain). pain         . pantoprazole (PROTONIX) 40 MG tablet   Oral   Take 1 tablet (40 mg total) by mouth daily at 12 noon.   30 tablet   0   . PHENobarbital (LUMINAL) 32.4 MG tablet   Oral   Take 32.4-64.8 mg by mouth 2 (two) times daily. Take 1 tablet in the morning and 2 tablets at in the evening           BP 121/64  Pulse 67  Temp(Src) 98.9 F (37.2 C) (Oral)  Resp 18  SpO2 92%  Physical Exam  Nursing note and vitals reviewed. Constitutional: He appears well-developed and well-nourished. No distress.  HENT:  Head: Normocephalic and atraumatic.  Cardiovascular: Normal rate and regular rhythm.   Pulmonary/Chest: Effort normal. No respiratory distress. He has wheezes.  Surgical scar along the left lateral chest wall. There is tenderness over the lateral chest wall. No gross deformities. No crepitus.  Abdominal: Soft. There is no tenderness.  Genitourinary: Testes normal and penis normal.   Foley catheter secured in place. No bleeding or drainage around catheter 2. Normal genitalia.  Musculoskeletal: He exhibits tenderness.  Surgical scar over left anterior shoulder. There is significant reduced range of motion of the shoulder secondary to pain. No significant swelling or gross deformities. Normal distal radial pulses, grip strength and sensation in hand.  There is bilateral above-knee amputation.  Neurological: He is alert.  Skin: Skin is warm and dry. No erythema.  Psychiatric: He has a normal mood and affect. His behavior is normal.    ED Course  Procedures   Results for orders placed during the hospital encounter of 09/13/12  URINALYSIS, ROUTINE W REFLEX MICROSCOPIC      Result Value Range   Color, Urine AMBER (*) YELLOW   APPearance CLOUDY (*) CLEAR   Specific Gravity, Urine 1.016  1.005 - 1.030   pH 7.0  5.0 - 8.0   Glucose, UA NEGATIVE  NEGATIVE mg/dL   Hgb urine dipstick NEGATIVE  NEGATIVE   Bilirubin Urine MODERATE (*) NEGATIVE   Ketones, ur NEGATIVE  NEGATIVE mg/dL   Protein, ur 30 (*) NEGATIVE mg/dL   Urobilinogen, UA 4.0 (*) 0.0 - 1.0 mg/dL   Nitrite POSITIVE (*) NEGATIVE   Leukocytes, UA LARGE (*) NEGATIVE  CBC WITH DIFFERENTIAL      Result Value Range   WBC 8.7  4.0 - 10.5 K/uL   RBC 3.48 (*) 4.22 - 5.81 MIL/uL   Hemoglobin 9.7 (*) 13.0 - 17.0 g/dL   HCT 78.2 (*) 95.6 - 21.3 %   MCV 84.5  78.0 - 100.0 fL   MCH 27.9  26.0 - 34.0 pg   MCHC 33.0  30.0 - 36.0 g/dL   RDW 08.6 (*) 57.8 - 46.9 %   Platelets 268  150 - 400 K/uL   Neutrophils Relative 88 (*) 43 - 77 %   Neutro Abs 7.7  1.7 - 7.7 K/uL   Lymphocytes Relative 6 (*) 12 - 46 %   Lymphs Abs 0.6 (*) 0.7 - 4.0 K/uL   Monocytes Relative 4  3 - 12 %   Monocytes Absolute 0.3  0.1 - 1.0 K/uL   Eosinophils Relative 2  0 - 5 %   Eosinophils Absolute 0.2  0.0 - 0.7 K/uL   Basophils Relative 0  0 - 1 %   Basophils Absolute 0.0  0.0 - 0.1 K/uL  COMPREHENSIVE METABOLIC PANEL  Result Value  Range   Sodium 128 (*) 135 - 145 mEq/L   Potassium 3.0 (*) 3.5 - 5.1 mEq/L   Chloride 85 (*) 96 - 112 mEq/L   CO2 32  19 - 32 mEq/L   Glucose, Bld 105 (*) 70 - 99 mg/dL   BUN 7  6 - 23 mg/dL   Creatinine, Ser 1.61  0.50 - 1.35 mg/dL   Calcium 7.6 (*) 8.4 - 10.5 mg/dL   Total Protein 6.9  6.0 - 8.3 g/dL   Albumin 1.5 (*) 3.5 - 5.2 g/dL   AST 27  0 - 37 U/L   ALT 27  0 - 53 U/L   Alkaline Phosphatase 202 (*) 39 - 117 U/L   Total Bilirubin 1.2  0.3 - 1.2 mg/dL   GFR calc non Af Amer >90  >90 mL/min   GFR calc Af Amer >90  >90 mL/min  CK      Result Value Range   Total CK 114  7 - 232 U/L  URINE MICROSCOPIC-ADD ON      Result Value Range   Squamous Epithelial / LPF RARE  RARE   WBC, UA TOO NUMEROUS TO COUNT  <3 WBC/hpf   Bacteria, UA FEW (*) RARE       Dg Chest 1 View  09/14/2012  *RADIOLOGY REPORT*  Clinical Data: Left-sided chest pain after fall.  CHEST - 1 VIEW  Comparison: 08/30/2012  Findings: Stable appearance of right-sided central venous catheter. Shallow inspiration with elevation right hemidiaphragm.  Possible fluid or thickened pleura in the right costophrenic angle.  Heart size and pulmonary vascularity are normal.  No focal consolidation or airspace disease.  No pneumothorax.  Old left rib fractures. Postoperative changes in the left shoulder.  Stable appearance since previous study.  IMPRESSION: No evidence of active pulmonary disease.   Original Report Authenticated By: Burman Nieves, M.D.    Dg Shoulder Left  09/14/2012  *RADIOLOGY REPORT*  Clinical Data: Left shoulder pain after fall.  LEFT SHOULDER - 2+ VIEW  Comparison: 08/25/2012  Findings: Postoperative changes in the left proximal humerus with plate and screw fixation across to old fracture deformity with callus formation.  Mild residual varus angulation.  No significant change since previous study.  Surgical clips in the axilla.  There is cortical irregularity at the distal aspect of the scapula.  This area was  not well seen on the previous study but was not definitely present.  No acute scapular fractures not excluded.  Correlation with the location of the patient's pain is recommended.  The proximal humerus, glenohumeral joint, acromioclavicular joint, and coracoclavicular space appear intact.  Visualized ribs are not displaced.  No focal bone lesion or bone destruction.  IMPRESSION: Old postoperative and post-traumatic changes in the proximal humerus.  Possible fracture of the inferior aspect of the scapula.   Original Report Authenticated By: Burman Nieves, M.D.      1. Fall   2. UTI (lower urinary tract infection)       MDM  12:45 AM patient seen and evaluated. Patient resting comfortably appears well in no acute distress. Patient was seen and evaluated for a fall 3 weeks ago and admitted to the hospital.  X-rays show left rib fractures. There is also possible fracture to the inferior aspect of the left scapula.  Patient discussed with attending physician. Reviews of x-rays and CAT scan shows similar left scapular deformity from previous. This time do not suspect any new or concerning injuries or need  for repeat CT of the chest.  UA shows signs of infection. Will replace fluids catheter and treatment Rocephin. Patient was given pain medications and move to TCU to await social work consult.    Angus Seller, [place] 09/14/12 [phone]

## 2012-09-14 NOTE — ED Provider Notes (Signed)
Medical screening examination/treatment/procedure(s) were performed by non-physician practitioner and as supervising physician I was immediately available for consultation/collaboration.  Olivia Mackie, MD 09/14/12 [phone]

## 2012-09-15 LAB — CBC
MCHC: 33.5 g/dL (ref 30.0–36.0)
RDW: 17 % — ABNORMAL HIGH (ref 11.5–15.5)
WBC: 7.6 10*3/uL (ref 4.0–10.5)

## 2012-09-15 LAB — COMPREHENSIVE METABOLIC PANEL
ALT: 26 U/L (ref 0–53)
Alkaline Phosphatase: 218 U/L — ABNORMAL HIGH (ref 39–117)
BUN: 4 mg/dL — ABNORMAL LOW (ref 6–23)
CO2: 27 mEq/L (ref 19–32)
Calcium: 7 mg/dL — ABNORMAL LOW (ref 8.4–10.5)
GFR calc Af Amer: >90 mL/min (ref >90)
GFR calc non Af Amer: >90 mL/min (ref >90)
Glucose, Bld: 142 mg/dL — ABNORMAL HIGH (ref 70–99)
Potassium: 2.9 mEq/L — ABNORMAL LOW (ref 3.5–5.1)
Sodium: 122 mEq/L — ABNORMAL LOW (ref 135–145)

## 2012-09-15 MED ORDER — POTASSIUM CHLORIDE CRYS ER 20 MEQ PO TBCR
40.0000 meq | EXTENDED_RELEASE_TABLET | Freq: Two times a day (BID) | ORAL | Status: DC
  Administered 2012-09-15 – 2012-09-16 (×3): 40 meq via ORAL
  Filled 2012-09-15 (×5): qty 2

## 2012-09-15 MED ORDER — OXYCODONE HCL 5 MG PO TABS
30.0000 mg | ORAL_TABLET | ORAL | Status: DC | PRN
  Administered 2012-09-15 – 2012-09-21 (×17): 30 mg via ORAL
  Filled 2012-09-15: qty 1
  Filled 2012-09-15 (×5): qty 6
  Filled 2012-09-15: qty 5
  Filled 2012-09-15 (×4): qty 6
  Filled 2012-09-15: qty 1
  Filled 2012-09-15 (×6): qty 6

## 2012-09-15 MED ORDER — SODIUM CHLORIDE 0.9 % IV SOLN
INTRAVENOUS | Status: DC
  Administered 2012-09-15 – 2012-09-20 (×7): via INTRAVENOUS

## 2012-09-15 MED ORDER — LEVOFLOXACIN 750 MG PO TABS
750.0000 mg | ORAL_TABLET | ORAL | Status: DC
  Administered 2012-09-15: 750 mg via ORAL
  Filled 2012-09-15 (×2): qty 1

## 2012-09-15 MED ORDER — OXYCODONE HCL 80 MG PO TB12: 80.0000 mg | ORAL_TABLET | Freq: Two times a day (BID) | ORAL | Status: DC

## 2012-09-15 MED ORDER — POTASSIUM CHLORIDE 10 MEQ/100ML IV SOLN
10.0000 meq | INTRAVENOUS | Status: AC
  Administered 2012-09-15 (×2): 10 meq via INTRAVENOUS
  Filled 2012-09-15 (×2): qty 100

## 2012-09-15 MED ORDER — OXYCODONE HCL ER 40 MG PO T12A
80.0000 mg | EXTENDED_RELEASE_TABLET | Freq: Two times a day (BID) | ORAL | Status: DC
  Administered 2012-09-15 – 2012-09-20 (×11): 80 mg via ORAL
  Filled 2012-09-15 (×8): qty 2
  Filled 2012-09-15: qty 4
  Filled 2012-09-15 (×2): qty 2

## 2012-09-15 MED ORDER — OXYCODONE HCL 5 MG PO TABS: 30.0000 mg | ORAL_TABLET | ORAL | Status: DC | PRN

## 2012-09-15 NOTE — Progress Notes (Signed)
UR completed 

## 2012-09-15 NOTE — Progress Notes (Signed)
Pt has refused during the night any hygiene care. Pt has dressings on his sacrum and his left stump that he also refuses to let anyone redress.  Will inform oncoming RN and MD. Conley Rolls, RN

## 2012-09-15 NOTE — Progress Notes (Signed)
Clinical Social Work Department BRIEF PSYCHOSOCIAL ASSESSMENT 09/15/2012  Patient:  Chris [name], Chris [name]     Account Number:  [id number]     Admit date:  09/13/2012  Clinical Social Worker:  Leron Croak, CLINICAL SOCIAL WORKER  Date/Time:  09/15/2012 08:14 AM  Referred by:  Physician  Date Referred:  09/14/2012 Referred for  SNF Placement   Other Referral:   Interview type:  Patient Other interview type:    PSYCHOSOCIAL DATA Living Status:  SIBLING Admitted from facility:   Level of care:   Primary support name:  [name] Primary support relationship to patient:  CHILD, ADULT Degree of support available:   Patient has very limited support systems    CURRENT CONCERNS Current Concerns  Post-Acute Placement   Other Concerns:    SOCIAL WORK ASSESSMENT / PLAN CSW met with the Pt at the bedside in the ED. Pt was not very alert but able to speak with the CSW about wanting to go to a SNF. Per nurse the Pt is living with his sister and she is not helpoing take care of him. Pt fell in his home and was pinned for an hour before discovered. Pt is a bilateral ambutee and has limited mobility. Pt gave permission to search in the [facility] area for SNF placement.   Assessment/plan status:  Information/Referral to [facility] Other assessment/ plan:   Information/referral to community resources:   CSW provided the Pt with a listing of SNF's in the local area    PATIENT'S/FAMILY'S RESPONSE TO PLAN OF CARE: Patient appreciative but very sick and limited interaction        Leron Croak, Judie Grieve Weekend Coverage [phone]

## 2012-09-15 NOTE — Progress Notes (Signed)
[facility] PROGRESS NOTE  Chris [name] MVH:[id number] DOB: 1956-03-11 DOA: 09/13/2012 PCP: [name], MD  Assessment/Plan: 1-UTI: patient with risk factors for UTI. UA with too numerous to count wbc. He has history of VRE. Per prior culture sensitive to ampicillin. Continue with Unasyn day 2. Urine culture pending. blood culture pending. Lactic acid normal.   2-Acute respiratory failure: Patient requiring 4 L oxygen. Patient relates cough. Will treat empirically for PNA. NSL.  Vancomycin and Unasyn and levaquin day 2 to cover for health care associated PNA.  Sputum culture, blood culture. Refuse BIPAP. He wants to be DNI.  3-Hyponatremia: probably related to de dehydration. Received 2 L in the ED. Repeat B-met today pending.  4;Hypokalemia: Kcl 40 meq times 2 doses.  5-Anemia: Hb baseline 9 to 11. Repeat hb in am pending.  6-Fall:  CT abdomen and pelvis: A left tenth rib fracture which is likely acute since 08/30/2012.  Improved splenic laceration with resolved perisplenic hematoma. No other acute post-traumatic deformity identified.   7-Chronic pain: he is on very high dose narcotic. He is more awake today. Will resume lower dose home medications.  8-Seizure disorder: Continue with phenobarbital.  9-Decubitus wound stage 2, small ulcer left stump: wound care consulted.  10-left tenth rib fracture//Possible fracture of the inferior aspect of the scapula: pain medication. 11-DVT prophylaxis: if hb stable, will consider heparin.    Code Status: DNR/DNI Family Communication: Care discussed with patient.  Disposition Plan: transfer to regula bed.    Consultants: None  Procedures:  None  Antibiotics:  Unasyn  Vancomycin  Levaquin  HPI/Subjective: Awake,complaining of severe pain.  He does not want IVC filter placement.  He does not want pressors.  He will let nurses to change dressing. \ He agree to continue with antibiotics. He does not want to use BIPAP.  Does not want to be intubated, or resuscitated.  Does not want chest x ray today.  Objective: Filed Vitals:   09/14/12 2000 09/15/12 0000 09/15/12 0400 09/15/12 0500  BP: 122/66 100/59 101/53   Pulse: 115 101 50   Temp: 98.6 F (37 C) 100.8 F (38.2 C) 99.3 F (37.4 C)   TempSrc: Oral Oral Oral   Resp: 28 27 24    Weight:    80.8 kg (178 lb 2.1 oz)  SpO2: 98% 96% 95%     Intake/Output Summary (Last 24 hours) at 09/15/12 0735 Last data filed at 09/15/12 0600  Gross per 24 hour  Intake   1770 ml  Output   2350 ml  Net   -580 ml   Filed Weights   09/15/12 0500  Weight: 80.8 kg (178 lb 2.1 oz)    Exam:   General:  Awake, in no distress.  Cardiovascular: S 1, S 2 RRR, tachycardic  Respiratory: Crackles bases  Abdomen: Bs present, soft, colostomy.   Bilateral AKA.  Data Reviewed: Basic Metabolic Panel:  Recent Labs Lab 09/14/12 0120 09/14/12 1526  NA 128* 130*  K 3.0* 3.1*  CL 85* 89*  CO2 32 33*  GLUCOSE 105* 148*  BUN 7 6  CREATININE 0.50 0.54  CALCIUM 7.6* 7.6*   Liver Function Tests:  Recent Labs Lab 09/14/12 0120 09/14/12 1526  AST 27 30  ALT 27 25  ALKPHOS 202* 218*  BILITOT 1.2 1.1  PROT 6.9 6.9  ALBUMIN 1.5* 1.6*   CBC:  Recent Labs Lab 09/14/12 0120  WBC 8.7  NEUTROABS 7.7  HGB 9.7*  HCT 29.4*  MCV 84.5  PLT  268   Cardiac Enzymes:  Recent Labs Lab 09/14/12 0120  CKTOTAL 114   BNP (last 3 results)  Recent Labs  11/09/11 1510 11/09/11 1858  PROBNP 393.9* 1229.0*   CBG: No results found for this basename: GLUCAP,  in the last 168 hours  No results found for this or any previous visit (from the past 240 hour(s)).   Studies: Dg Chest 1 View  09/14/2012  *RADIOLOGY REPORT*  Clinical Data: Left-sided chest pain after fall.  CHEST - 1 VIEW  Comparison: 08/30/2012  Findings: Stable appearance of right-sided central venous catheter. Shallow inspiration with elevation right hemidiaphragm.  Possible fluid or thickened  pleura in the right costophrenic angle.  Heart size and pulmonary vascularity are normal.  No focal consolidation or airspace disease.  No pneumothorax.  Old left rib fractures. Postoperative changes in the left shoulder.  Stable appearance since previous study.  IMPRESSION: No evidence of active pulmonary disease.   Original Report Authenticated By: Burman Nieves, M.D.    Ct Abdomen Pelvis W Contrast  09/14/2012  *RADIOLOGY REPORT*  Clinical Data: Left side abdominal pain after fall today.  History of splenic laceration.  CT ABDOMEN AND PELVIS WITH CONTRAST  Technique:  Multidetector CT imaging of the abdomen and pelvis was performed following the standard protocol during bolus administration of intravenous contrast.  Contrast: OMNIPAQUE IOHEXOL 300 MG/ML  SOLN  Comparison: 08/30/2012  Findings: Lung bases:  Mild apparent nodularity the anterior right lung base is new since the prior exam and therefore likely atelectasis.  There is increased right and similar left base dependent atelectasis.  No basilar pneumothorax.  Normal heart size with age advanced coronary artery atherosclerosis.  Calcified lower mediastinal nodes, as detailed on 08/25/2012.  Small hiatal hernia. Small pericardial effusion which is new.  Trace left pleural fluid thickening, improved.  Abdomen/pelvis:  Normal liver.  Improved appearance of the splenic laceration, with hypoattenuation identified on image 26/series 2. Resolved perisplenic hemorrhage.  Normal distal stomach.  Mild pancreatic atrophy. Cholecystectomy without biliary ductal dilatation.  Normal adrenal glands and right kidney.  An interpolar left renal cyst measures 1.8 cm.  There is also a too small to characterize lower pole left renal lesion which is not diagnostic of a simple cyst.  There is an IVC filter within the right IVC, terminating below the renal veins.  However, there is also a left IVC, which is patent and free of thrombus.  No retroperitoneal or retrocrural  adenopathy.  Hartmann's pouch.  Descending colostomy. Normal terminal ileum. Normal small bowel without abdominal ascites.  Left external iliac node is borderline enlarged 1.0 cm and unchanged.  Urinary bladder collapsed and a Foley catheter.  Normal prostate. No significant free fluid.  Bones/Musculoskeletal:  Question skin irregularity/decubitus ulcer about the right ischial region on image 100/series 2.  Osteopenia. Left proximal femoral fixation.  Left scapular tip is fractured on image 6/series 2.  This was similar on the 08/30/2012 exam.  There is also a fracture of the left tenth posterolateral rib on image 41/series 2.  This is likely new since the prior of 08/30/2012. Other left-sided rib fractures or remote.  IMPRESSION:  1.  A left tenth rib fracture which is likely acute since 08/30/2012. 2.  Improved splenic laceration with resolved perisplenic hematoma. 3.  No other acute post-traumatic deformity identified. 4.  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  The patient may benefit from a second filter in the left IVC. 5.  Subacute  left-sided scapular tip fracture, incompletely imaged. 6.  New small pericardial effusion.   Original Report Authenticated By: Jeronimo Greaves, M.D.    Dg Shoulder Left  09/14/2012  **ADDENDUM** CREATED: 09/14/2012 02:35:43  The focal irregularity of the tip of the scapula was present on previous CT of the chest dated 08/25/2012 and is therefore not a new finding since that time.  **END ADDENDUM** SIGNED BY: Marlon Pel, M.D.   09/14/2012  *RADIOLOGY REPORT*  Clinical Data: Left shoulder pain after fall.  LEFT SHOULDER - 2+ VIEW  Comparison: 08/25/2012  Findings: Postoperative changes in the left proximal humerus with plate and screw fixation across to old fracture deformity with callus formation.  Mild residual varus angulation.  No significant change since previous study.  Surgical clips in the axilla.  There is cortical irregularity at the  distal aspect of the scapula.  This area was not well seen on the previous study but was not definitely present.  No acute scapular fractures not excluded.  Correlation with the location of the patient's pain is recommended.  The proximal humerus, glenohumeral joint, acromioclavicular joint, and coracoclavicular space appear intact.  Visualized ribs are not displaced.  No focal bone lesion or bone destruction.  IMPRESSION: Old postoperative and post-traumatic changes in the proximal humerus.  Possible fracture of the inferior aspect of the scapula.   Original Report Authenticated By: Burman Nieves, M.D.     Scheduled Meds: . sodium chloride   Intravenous Once  . ampicillin-sulbactam (UNASYN) IV  3 g Intravenous Q6H  . fluconazole  100 mg Oral Daily  . levofloxacin (LEVAQUIN) IV  750 mg Intravenous Q24H  . oxyCODONE  80 mg Oral Q12H  . pantoprazole  40 mg Oral Q1200  . PHENobarbital  32.4 mg Oral Daily  . phenobarbital  64.8 mg Oral QHS  . potassium chloride  40 mEq Oral BID  . sodium chloride  3 mL Intravenous Q12H  . vancomycin  1,000 mg Intravenous Q12H   Continuous Infusions:   Active Problems:   Seizure disorder   Acute respiratory failure with hypoxia   Hyponatremia   Hypokalemia   S/P BKA (below knee amputation) bilateral    Time spent: 35 minutes.    Donesha Wallander  [facility] Pager [phone]. If 8PM-8AM, please contact night-coverage at [email or web address], password [facility] 09/15/2012, 7:35 AM  LOS: 2 days

## 2012-09-16 ENCOUNTER — Inpatient Hospital Stay (HOSPITAL_COMMUNITY): Payer: Medicaid Other

## 2012-09-16 DIAGNOSIS — R7881 Bacteremia: Secondary | ICD-10-CM

## 2012-09-16 DIAGNOSIS — A4902 Methicillin resistant Staphylococcus aureus infection, unspecified site: Secondary | ICD-10-CM

## 2012-09-16 LAB — BASIC METABOLIC PANEL
CO2: 29 mEq/L (ref 19–32)
Calcium: 6.9 mg/dL — ABNORMAL LOW (ref 8.4–10.5)
Creatinine, Ser: 0.5 mg/dL (ref 0.50–1.35)
GFR calc non Af Amer: >90 mL/min (ref >90)
Sodium: 126 mEq/L — ABNORMAL LOW (ref 135–145)

## 2012-09-16 LAB — URINE CULTURE

## 2012-09-16 LAB — CBC
MCH: 28.6 pg (ref 26.0–34.0)
MCHC: 33.2 g/dL (ref 30.0–36.0)
MCV: 86.1 fL (ref 78.0–100.0)
Platelets: 200 10*3/uL (ref 150–400)
RBC: 2.8 MIL/uL — ABNORMAL LOW (ref 4.22–5.81)

## 2012-09-16 LAB — APTT: aPTT: 38 s — ABNORMAL HIGH (ref 24–37)

## 2012-09-16 LAB — PROTIME-INR
INR: 1.16 (ref 0.00–1.49)
Prothrombin Time: 14.6 seconds (ref 11.6–15.2)

## 2012-09-16 MED ORDER — SODIUM CHLORIDE 0.9 % IV SOLN
100.0000 mg | Freq: Every day | INTRAVENOUS | Status: DC
  Administered 2012-09-16 – 2012-09-21 (×5): 100 mg via INTRAVENOUS
  Filled 2012-09-16 (×10): qty 100

## 2012-09-16 MED ORDER — CARISOPRODOL 350 MG PO TABS
350.0000 mg | ORAL_TABLET | Freq: Three times a day (TID) | ORAL | Status: DC
  Administered 2012-09-16 – 2012-09-21 (×13): 350 mg via ORAL
  Filled 2012-09-16 (×13): qty 1

## 2012-09-16 MED ORDER — MIDAZOLAM HCL 2 MG/2ML IJ SOLN
INTRAMUSCULAR | Status: AC | PRN
  Administered 2012-09-16: 0.5 mg via INTRAVENOUS

## 2012-09-16 MED ORDER — FENTANYL CITRATE 0.05 MG/ML IJ SOLN
INTRAMUSCULAR | Status: AC | PRN
  Administered 2012-09-16 (×2): 25 ug via INTRAVENOUS

## 2012-09-16 MED ORDER — VANCOMYCIN HCL IN DEXTROSE 1-5 GM/200ML-% IV SOLN
1000.0000 mg | Freq: Three times a day (TID) | INTRAVENOUS | Status: DC
  Administered 2012-09-16 – 2012-09-18 (×7): 1000 mg via INTRAVENOUS
  Filled 2012-09-16 (×6): qty 200

## 2012-09-16 MED ORDER — ALPRAZOLAM 1 MG PO TABS
1.0000 mg | ORAL_TABLET | Freq: Three times a day (TID) | ORAL | Status: DC | PRN
  Administered 2012-09-17 – 2012-09-21 (×11): 1 mg via ORAL
  Filled 2012-09-16 (×11): qty 1

## 2012-09-16 MED ORDER — CEFAZOLIN SODIUM 1-5 GM-% IV SOLN
1.0000 g | Freq: Three times a day (TID) | INTRAVENOUS | Status: DC
  Administered 2012-09-16: 1 g via INTRAVENOUS
  Filled 2012-09-16 (×3): qty 50

## 2012-09-16 MED ORDER — FERROUS SULFATE 325 (65 FE) MG PO TABS
325.0000 mg | ORAL_TABLET | Freq: Two times a day (BID) | ORAL | Status: DC
  Administered 2012-09-16 – 2012-09-21 (×9): 325 mg via ORAL
  Filled 2012-09-16 (×12): qty 1

## 2012-09-16 NOTE — Progress Notes (Addendum)
Automatic Formal ID Consultation to follow later today for Staph Aureus Bacteremia.  Please not that the patient also had positive blood cultures for c.albicans on 2/6 that does not appear to appropriately treated nor evaluated  Please get TEE to evaluate for endocarditis     [facility]icrobial Management Team Staphylococcus aureus bacteremia   Staphylococcus aureus bacteremia (SAB) is associated with a high rate of complications and mortality.  Specific aspects of clinical management are critical to optimizing the outcome of patients with SAB.  Therefore, the [facility] Antimicrobial Management Team [facility]) has initiated an intervention aimed at improving the management of SAB at [facility].  To do so, Infectious Diseases physicians are providing an evidence-based consult for the management of all patients with SAB.     Yes No Comments  Perform follow-up blood cultures (even if the patient is afebrile) to ensure clearance of bacteremia [x]  []    Remove vascular catheter and obtain follow-up blood cultures after the removal of the catheter []  []    Perform echocardiography to evaluate for endocarditis (transthoracic ECHO is 40-50% sensitive, TEE is > 90% sensitive) Please get TEE []  [x]  Please keep in mind, that neither test can definitively EXCLUDE endocarditis, and that should clinical suspicion remain high for endocarditis the patient should then still be treated with an "endocarditis" duration of therapy = 6 weeks       Ensure source control []  []  Have all abscesses been drained effectively? Have deep seeded infections (septic joints or osteomyelitis) had appropriate surgical debridement?  Investigate for "metastatic" sites of infection []  []  Does the patient have ANY symptom or physical exam finding that would suggest a deeper infection (back or neck pain that may be suggestive of vertebral osteomyelitis or epidural abscess, muscle pain that could be a symptom of pyomyositis)?  Keep  in mind that for deep seeded infections MRI imaging with contrast is preferred rather than other often insensitive tests such as plain x-rays, especially early in a patient's presentation.  Change antibiotic therapy to __________vancomycin,plus cefazolin_ []  []  Beta-lactam antibiotics are preferred for MSSA due to higher cure rates.   If on Vancomycin, goal trough should be 15 - 20 mcg/mL  Estimated duration of IV antibiotic therapy:  At least 4 wks []  []  Consult case management for probably prolonged outpatient IV antibiotic therapy

## 2012-09-16 NOTE — Progress Notes (Signed)
CSW attempted to follow up with pt in regard to SNF placement.  Pt currently out of room for procedure.  CSW will follow up with pt.  Currently, no bed offers, but two facilities are considering.   CSW to continue to follow to assist with pt discharge planning needs.   Jacklynn Lewis, MSW, LCSWA  Clinical Social Work [phone]

## 2012-09-16 NOTE — Progress Notes (Signed)
ANTIBIOTIC CONSULT NOTE - FOLLOW UP  Pharmacy Consult for Vancomycin/Ancef Indication: Staph aureus in 2/2 bcx and in urine cx  Allergies  Allergen Reactions  . Fish Allergy Anaphylaxis    Pt can tolerate shellfish  . Ibuprofen Other (See Comments)    hallucinations  . Ketorolac Tromethamine Other (See Comments)    hallucination  . Naproxen Other (See Comments)    hallucination    Patient Measurements: Height: 4' (121.9 cm) Weight: 179 lb 0.2 oz (81.2 kg) IBW/kg (Calculated) : 22.4  Vital Signs: Temp: 100.1 F (37.8 C) (02/24 0642) Temp src: Oral (02/24 0642) BP: 109/57 mmHg (02/24 0642) Pulse Rate: 101 (02/24 0642) Intake/Output from previous day: 02/23 0701 - 02/24 0700 In: 1040 [P.O.:960; I.V.:80] Out: 3225 [Urine:3225] Intake/Output from this shift:    Labs:  Recent Labs  09/14/12 0120 09/14/12 1526 09/15/12 0735 09/16/12 0640  WBC 8.7  --  7.6 7.1  HGB 9.7*  --  8.4* 8.0*  PLT 268  --  217 200  CREATININE 0.50 0.54 0.51 0.50   Estimated Creatinine Clearance: 67.7 ml/min (by C-G formula based on Cr of 0.5). No results found for this basename: VANCOTROUGH, Leodis Binet, VANCORANDOM, GENTTROUGH, GENTPEAK, GENTRANDOM, TOBRATROUGH, TOBRAPEAK, TOBRARND, AMIKACINPEAK, AMIKACINTROU, AMIKACIN,  in the last 72 hours   Microbiology: Recent Results (from the past 720 hour(s))  URINE CULTURE     Status: None   Collection Time    09/14/12  2:04 AM      Result Value Range Status   Specimen Description URINE, CATHETERIZED   Final   Special Requests NONE   Final   Culture  Setup Time 09/14/2012 12:42   Final   Colony Count >=100,000 COLONIES/ML   Final   Culture     Final   Value: STAPHYLOCOCCUS AUREUS     Note: RIFAMPIN AND GENTAMICIN SHOULD NOT BE USED AS SINGLE DRUGS FOR TREATMENT OF STAPH INFECTIONS.   Report Status PENDING   Incomplete  URINE CULTURE     Status: None   Collection Time    09/14/12  6:14 AM      Result Value Range Status   Specimen Description  URINE, CATHETERIZED   Final   Special Requests NONE   Final   Culture  Setup Time 09/14/2012 12:42   Final   Colony Count >=100,000 COLONIES/ML   Final   Culture     Final   Value: STAPHYLOCOCCUS AUREUS     Note: RIFAMPIN AND GENTAMICIN SHOULD NOT BE USED AS SINGLE DRUGS FOR TREATMENT OF STAPH INFECTIONS.   Report Status PENDING   Incomplete  CULTURE, BLOOD (ROUTINE X 2)     Status: None   Collection Time    09/14/12  5:45 PM      Result Value Range Status   Specimen Description BLOOD LEFT HAND   Final   Special Requests BOTTLES DRAWN AEROBIC ONLY    Final   Culture  Setup Time 09/14/2012 21:00   Final   Culture     Final   Value: STAPHYLOCOCCUS AUREUS     Note: RIFAMPIN AND GENTAMICIN SHOULD NOT BE USED AS SINGLE DRUGS FOR TREATMENT OF STAPH INFECTIONS.     23 Note: Gram Stain Report Called to,Read Back By and Verified With:  SUE ELLEN GROUNDS 2 14 249PM MITCV   Report Status PENDING   Incomplete  CULTURE, BLOOD (ROUTINE X 2)     Status: None   Collection Time    09/14/12  5:45 PM  Result Value Range Status   Specimen Description BLOOD LEFT HAND   Final   Special Requests BOTTLES DRAWN AEROBIC ONLY    Final   Culture  Setup Time 09/14/2012 21:00   Final   Culture     Final   Value: STAPHYLOCOCCUS AUREUS     23 Note: Gram Stain Report Called to,Read Back By and Verified With: SUE ELLEN GROUNDS 2 14 249PM [facility]   Report Status PENDING   Incomplete    Anti-infectives   Start     Dose/Rate Route Frequency Ordered Stop   09/16/12 1200  ceFAZolin (ANCEF) IVPB 1 g/50 mL premix     1 g 100 mL/hr over 30 Minutes Intravenous Every 8 hours 09/16/12 1011     09/15/12 2000  levofloxacin (LEVAQUIN) tablet 750 mg  Status:  Discontinued     750 mg Oral Every 24 hours 09/15/12 1721 09/16/12 1013   09/14/12 1800  levofloxacin (LEVAQUIN) IVPB 750 mg  Status:  Discontinued     750 mg 100 mL/hr over 90 Minutes Intravenous Every 24 hours 09/14/12 1729 09/15/12 1721   09/14/12 1700   Ampicillin-Sulbactam (UNASYN) 3 g in sodium chloride 0.9 % 100 mL IVPB  Status:  Discontinued     3 g 100 mL/hr over 60 Minutes Intravenous Every 6 hours 09/14/12 1624 09/16/12 1009   09/14/12 1700  vancomycin (VANCOCIN) IVPB 1000 mg/200 mL premix     1,000 mg 200 mL/hr over 60 Minutes Intravenous Every 12 hours 09/14/12 1624     09/14/12 1100  fluconazole (DIFLUCAN) tablet 100 mg     100 mg Oral Daily 09/14/12 1016     09/14/12 0530  cefTRIAXone (ROCEPHIN) 1 g in dextrose 5 % 50 mL IVPB     1 g 100 mL/hr over 30 Minutes Intravenous  Once 09/14/12 0520 09/14/12 0700      Assessment: 56 YOM andmitted 2/22 after fall and L sided pain in abdomen in chest. 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 Pt was started on unasyn/levaquin and vancomycin 2/22 for PNA/UTI/decubitus ulcers. Pt has SA in 2/2 bcx and in ucx so changing abx to Vanc and ancef. Repeat bcx ordered, 2D Echo ordered.   D3 Vancomycin 1g IV q12h  Scr wnl/stable, WBC wnl, Tm 100.1  Goal of Therapy:  Vancomycin trough level 15-20 mcg/ml  Plan:   Vancomycin trough tonight to evaluate dose  Ancef 1g IV q8h  Follow cx   Adjust doses as necessary  Gwen Her PharmD  [phone] 09/16/2012 10:24 AM

## 2012-09-16 NOTE — Progress Notes (Signed)
  Echocardiogram 2D Echocardiogram has been performed.  Chris [name] 09/16/2012, 12:15 PM

## 2012-09-16 NOTE — Progress Notes (Signed)
Patient ID: Chris [name], male   DOB: 1956/02/17, 57 y.o.   MRN: [id number] Request received from ID service to remove pt's port a cath due to hx of staph bacteremia. Additiomal PMH as below. Exam: pt awake/alert; chest- CTA bilat ant., clean intact,NT rt chest PAC- currently accessed;  heart- S1,S2 with ectopy; abd- soft,+BS,tender left upper/mid abd region, intact colostomy; bilat AKA .   Filed Vitals:   09/15/12 0800 09/15/12 1425 09/15/12 2154 09/16/12 0642  BP: 108/56 109/57 97/54 109/57  Pulse: 108 95 92 101  Temp: 99.3 F (37.4 C) 98.2 F (36.8 C) 99.2 F (37.3 C) 100.1 F (37.8 C)  TempSrc: Oral Oral Oral Oral  Resp: 22 18 16 20   Height:   4' (1.219 m)   Weight:   179 lb 0.2 oz (81.2 kg)   SpO2: 95% 95% 97% 96%   Past Medical History  Diagnosis Date  . Colon cancer   . Seizures   . Presence of IVC filter 2011   Past Surgical History  Procedure Laterality Date  . Colostomy     Dg Chest 1 View  09/14/2012  *RADIOLOGY REPORT*  Clinical Data: Left-sided chest pain after fall.  CHEST - 1 VIEW  Comparison: 08/30/2012  Findings: Stable appearance of right-sided central venous catheter. Shallow inspiration with elevation right hemidiaphragm.  Possible fluid or thickened pleura in the right costophrenic angle.  Heart size and pulmonary vascularity are normal.  No focal consolidation or airspace disease.  No pneumothorax.  Old left rib fractures. Postoperative changes in the left shoulder.  Stable appearance since previous study.  IMPRESSION: No evidence of active pulmonary disease.   Original Report Authenticated By: Burman Nieves, M.D.    Ct Chest W Contrast  08/25/2012  **ADDENDUM** CREATED: 08/25/2012 17:07:21  Additional findings:  Left lower quadrant colostomy, unchanged.  Decubitus ulcers extending to the ischium bilaterally (series 2/image 28), unchanged.  AVN of the left femoral head, unchanged.  Duplicated left IVC.  **END ADDENDUM** SIGNED BY: Charline Bills, M.D.    08/25/2012  *RADIOLOGY REPORT*  Clinical Data:  Fall, left rib/abdominal pain, hematuria, history of non-Hodgkins lymphoma  CT CHEST, ABDOMEN AND PELVIS WITH CONTRAST  Technique:  Multidetector CT imaging of the chest, abdomen and pelvis was performed following the standard protocol during bolus administration of intravenous contrast.  Contrast: OMNIPAQUE IOHEXOL 300 MG/ML  SOLN  Comparison:  CT abdomen pelvis dated 11/10/2011  CT CHEST  Findings:  No evidence of traumatic aortic injury or mediastinal hematoma.  Patchy right lower lobe opacity, possibly reflecting atelectasis or aspiration.  Left basilar opacity, likely atelectasis.  Additional dependent atelectasis in the posterior bilateral upper lobes.  No pleural effusion or pneumothorax.  Visualized thyroid is unremarkable.  The heart is normal in size.  No pericardial effusion.  Coronary atherosclerosis. Atherosclerotic calcifications of the aortic arch.  Right chest port.  Thoracic lymphadenopathy, including: --10 mm short-axis left supraclavicular node (series 2/image 4) --15 mm short-axis high right paratracheal node (series 2/image 9) --19 mm short-axis subcarinal node (series 2/image 25)  Mild degenerative changes of the visualized thoracolumbar spine. Mild compression deformity of the T4 vertebral body (series 5/ image 67), age indeterminate, favored to be chronic.  Multiple left lateral rib fracture deformities.  No evidence of acute fracture or dislocation.  IMPRESSION: Mild compression deformity of the T4 vertebral body, age indeterminate, favored to be chronic.  Multiple left rib fracture deformities, old.  Patchy right lower lobe opacity, atelectasis versus aspiration.  Thoracic  lymphadenopathy, as described above.  Given the history, lymphomatous involvement is not excluded.  Consider tissue sampling or PET-CT as clinically warranted.  CT ABDOMEN AND PELVIS  Findings:  Moderate hiatal hernia.  Liver, pancreas, and adrenal glands within  normal limits.  Anterior splenic laceration (series 2/images 45 and 50), likely reflecting at least a grade II injury.  No definite active extravasation.  Associated small volume perihepatic, perisplenic, and pelvic hemorrhage.  Status post cholecystectomy.  No intrahepatic ductal dilatation. Common duct measures 14 mm, likely postsurgical.  Left kidney is notable for an 1.8 cm left renal cyst.  Right kidney is unremarkable.  No hydronephrosis.  No evidence of bowel obstruction.  Normal appendix.  Atherosclerotic calcifications of the abdominal aorta and branch vessels.  IVC filter.  No suspicious abdominopelvic lymphadenopathy.  Prostate is unremarkable.  Bladder is decompressed with indwelling Foley catheter.  Mild degenerative changes of the visualized thoracolumbar spine. Mild super endplate compression deformity at L2 (series 5/image 68), chronic.  Status post ORIF of the proximal left femur.  Old post-traumatic deformity of the pelvis.  IMPRESSION: Anterior splenic laceration, as described above, likely grade II.  Associated small volume perihepatic, perisplenic, and pelvic hemorrhage.  Critical Value/emergent results were called by telephone at the time of interpretation on 08/25/2012 at 1658 hours to Dr. Lynelle Doctor, who verbally acknowledged these results.  Original Report Authenticated By: Charline Bills, M.D.    Ct Abdomen Pelvis W Contrast  09/14/2012  *RADIOLOGY REPORT*  Clinical Data: Left side abdominal pain after fall today.  History of splenic laceration.  CT ABDOMEN AND PELVIS WITH CONTRAST  Technique:  Multidetector CT imaging of the abdomen and pelvis was performed following the standard protocol during bolus administration of intravenous contrast.  Contrast: OMNIPAQUE IOHEXOL 300 MG/ML  SOLN  Comparison: 08/30/2012  Findings: Lung bases:  Mild apparent nodularity the anterior right lung base is new since the prior exam and therefore likely atelectasis.  There is increased right and similar  left base dependent atelectasis.  No basilar pneumothorax.  Normal heart size with age advanced coronary artery atherosclerosis.  Calcified lower mediastinal nodes, as detailed on 08/25/2012.  Small hiatal hernia. Small pericardial effusion which is new.  Trace left pleural fluid thickening, improved.  Abdomen/pelvis:  Normal liver.  Improved appearance of the splenic laceration, with hypoattenuation identified on image 26/series 2. Resolved perisplenic hemorrhage.  Normal distal stomach.  Mild pancreatic atrophy. Cholecystectomy without biliary ductal dilatation.  Normal adrenal glands and right kidney.  An interpolar left renal cyst measures 1.8 cm.  There is also a too small to characterize lower pole left renal lesion which is not diagnostic of a simple cyst.  There is an IVC filter within the right IVC, terminating below the renal veins.  However, there is also a left IVC, which is patent and free of thrombus.  No retroperitoneal or retrocrural adenopathy.  Hartmann's pouch.  Descending colostomy. Normal terminal ileum. Normal small bowel without abdominal ascites.  Left external iliac node is borderline enlarged 1.0 cm and unchanged.  Urinary bladder collapsed and a Foley catheter.  Normal prostate. No significant free fluid.  Bones/Musculoskeletal:  Question skin irregularity/decubitus ulcer about the right ischial region on image 100/series 2.  Osteopenia. Left proximal femoral fixation.  Left scapular tip is fractured on image 6/series 2.  This was similar on the 08/30/2012 exam.  There is also a fracture of the left tenth posterolateral rib on image 41/series 2.  This is likely new since the prior of  08/30/2012. Other left-sided rib fractures or remote.  IMPRESSION:  1.  A left tenth rib fracture which is likely acute since 08/30/2012. 2.  Improved splenic laceration with resolved perisplenic hematoma. 3.  No other acute post-traumatic deformity identified. 4.  Double IVC with filter only identified in the  right-sided vena cava.  The patient may benefit from a second filter in the left IVC. 5.  Subacute left-sided scapular tip fracture, incompletely imaged. 6.  New small pericardial effusion.   Original Report Authenticated By: Jeronimo Greaves, M.D.    Ct Abdomen Pelvis W Contrast  08/30/2012  *RADIOLOGY REPORT*  Clinical Data: Follow-up splenic laceration.  CT ABDOMEN AND PELVIS WITH CONTRAST  Technique:  Multidetector CT imaging of the abdomen and pelvis was performed following the standard protocol during bolus administration of intravenous contrast.  Contrast: 50mL OMNIPAQUE IOHEXOL 300 MG/ML  SOLN, OMNIPAQUE IOHEXOL 300 MG/ML  SOLN  Comparison: 08/25/2012.  Findings: The lung bases demonstrate bibasilar atelectasis.  Remote healed left-sided rib fractures are noted.  Findings are somewhat suspicious for a a pulmonary embolism in the left lower lobe. Recommend correlation any pulmonary symptoms.  A hiatal hernia is again demonstrated.  The liver is intact.  No focal lesions.  The gallbladder is surgically absent.  Stable splenic enlargement and healing splenic laceration.  No change and small subcapsular splenic hematoma inferiorly.  No hemoperitoneum.  Stable enlarged upper abdominal lymph nodes and scattered small retroperitoneal lymph nodes.  The adrenal glands and kidneys are normal except for renal cysts.  The pancreas is unremarkable.  An IVC filter is noted and the IVC is very small in caliber.  No mesenteric or retroperitoneal mass or hematoma.  The stomach, duodenum, small bowel and colon are grossly normal. No inflammatory changes or mass lesions.  A left lower quadrant colostomy is noted.  The aorta is normal in caliber.  Stable atherosclerotic changes.  The major branch vessels are patent.  Stable pelvic hematoma.  The bladder is decompressed by Foley catheter.  No pelvic mass or adenopathy.  Remote pelvic trauma noted.  IMPRESSION:  1.  Healing splenic laceration with small stable subcapsular  splenic hematoma. 2.  Stable splenomegaly. 3.  No mesenteric or retroperitoneal mass or hematoma.  Stable enlarged upper abdominal lymph nodes. 4.  IVC filter in place. 5.  Left lower quadrant colostomy. 6.  Stable small amount of hemoperitoneum in the pelvis. 7.  Cannot exclude a left lower lobe pulmonary embolism.  Recommend correlation with clinical findings and D-dimer.  A dedicated CTA of the chest may be helpful for further evaluation if clinically indicated.   Original Report Authenticated By: Rudie Meyer, M.D.    Ct Abdomen Pelvis W Contrast  08/25/2012  **ADDENDUM** CREATED: 08/25/2012 17:07:21  Additional findings:  Left lower quadrant colostomy, unchanged.  Decubitus ulcers extending to the ischium bilaterally (series 2/image 28), unchanged.  AVN of the left femoral head, unchanged.  Duplicated left IVC.  **END ADDENDUM** SIGNED BY: Charline Bills, M.D.   08/25/2012  *RADIOLOGY REPORT*  Clinical Data:  Fall, left rib/abdominal pain, hematuria, history of non-Hodgkins lymphoma  CT CHEST, ABDOMEN AND PELVIS WITH CONTRAST  Technique:  Multidetector CT imaging of the chest, abdomen and pelvis was performed following the standard protocol during bolus administration of intravenous contrast.  Contrast: OMNIPAQUE IOHEXOL 300 MG/ML  SOLN  Comparison:  CT abdomen pelvis dated 11/10/2011  CT CHEST  Findings:  No evidence of traumatic aortic injury or mediastinal hematoma.  Patchy right lower lobe  opacity, possibly reflecting atelectasis or aspiration.  Left basilar opacity, likely atelectasis.  Additional dependent atelectasis in the posterior bilateral upper lobes.  No pleural effusion or pneumothorax.  Visualized thyroid is unremarkable.  The heart is normal in size.  No pericardial effusion.  Coronary atherosclerosis. Atherosclerotic calcifications of the aortic arch.  Right chest port.  Thoracic lymphadenopathy, including: --10 mm short-axis left supraclavicular node (series 2/image 4) --15 mm  short-axis high right paratracheal node (series 2/image 9) --19 mm short-axis subcarinal node (series 2/image 25)  Mild degenerative changes of the visualized thoracolumbar spine. Mild compression deformity of the T4 vertebral body (series 5/ image 23), age indeterminate, favored to be chronic.  Multiple left lateral rib fracture deformities.  No evidence of acute fracture or dislocation.  IMPRESSION: Mild compression deformity of the T4 vertebral body, age indeterminate, favored to be chronic.  Multiple left rib fracture deformities, old.  Patchy right lower lobe opacity, atelectasis versus aspiration.  Thoracic lymphadenopathy, as described above.  Given the history, lymphomatous involvement is not excluded.  Consider tissue sampling or PET-CT as clinically warranted.  CT ABDOMEN AND PELVIS  Findings:  Moderate hiatal hernia.  Liver, pancreas, and adrenal glands within normal limits.  Anterior splenic laceration (series 2/images 45 and 50), likely reflecting at least a grade II injury.  No definite active extravasation.  Associated small volume perihepatic, perisplenic, and pelvic hemorrhage.  Status post cholecystectomy.  No intrahepatic ductal dilatation. Common duct measures 14 mm, likely postsurgical.  Left kidney is notable for an 1.8 cm left renal cyst.  Right kidney is unremarkable.  No hydronephrosis.  No evidence of bowel obstruction.  Normal appendix.  Atherosclerotic calcifications of the abdominal aorta and branch vessels.  IVC filter.  No suspicious abdominopelvic lymphadenopathy.  Prostate is unremarkable.  Bladder is decompressed with indwelling Foley catheter.  Mild degenerative changes of the visualized thoracolumbar spine. Mild super endplate compression deformity at L2 (series 5/image 68), chronic.  Status post ORIF of the proximal left femur.  Old post-traumatic deformity of the pelvis.  IMPRESSION: Anterior splenic laceration, as described above, likely grade II.  Associated small volume  perihepatic, perisplenic, and pelvic hemorrhage.  Critical Value/emergent results were called by telephone at the time of interpretation on 08/25/2012 at 1658 hours to Dr. Lynelle Doctor, who verbally acknowledged these results.  Original Report Authenticated By: Charline Bills, M.D.    US Venous Img Lower Unilateral Left  08/30/2012  *RADIOLOGY REPORT*  Clinical Data: History given of left leg pain.  History of above- the-knee amputation.  History of injury from fall.  LEFT LOWER EXTREMITY VENOUS DUPLEX ULTRASOUND  Technique:  Gray-scale sonography with graded compression, as well as color Doppler and duplex ultrasound were performed to evaluate the deep venous system of the lower extremity from the level of the common femoral vein through the popliteal and proximal calf veins. Spectral Doppler was utilized to evaluate flow at rest and with distal augmentation maneuvers.  Comparison:  None.  Findings:  There is been a previous above-the-knee amputation. Normal compressibility of the common femoral, and residual femoral veins is demonstrated . No filling defects to suggest DVT on grayscale or color Doppler imaging.  Doppler waveforms show normal direction of venous flow, normal respiratory phasicity and response to augmentation.  IMPRESSION: Post above-the-knee amputation.  No evidence of left leg deep venous thrombosis.   Original Report Authenticated By: Onalee Hua Call    Dg Chest Port 1 View  08/30/2012  *RADIOLOGY REPORT*  Clinical Data: Spiking fevers, history splenic  laceration, colon cancer  PORTABLE CHEST - 1 VIEW  Comparison: Portable exam 0857 hours compared to 08/29/2012  Findings: Right side Port-A-Cath stable tip projecting over SVC. Upper normal heart size. Tortuous aorta. Pulmonary vascularity normal. Bronchitic changes with persistent right basilar atelectasis. Remaining lungs clear. No definite infiltrate, pleural effusion or pneumothorax. Bones appear demineralized with old left rib fractures noted.   IMPRESSION: Persistent right basilar atelectasis.   Original Report Authenticated By: Ulyses Southward, M.D.    Dg Chest Port 1 View  08/29/2012  *RADIOLOGY REPORT*  Clinical Data: Fever.  Immunocompromised patient.  History of colon cancers.  PORTABLE CHEST - 1 VIEW  Comparison: 11/12/2011  Findings: Shallow inspiration.  Mild cardiac enlargement with borderline pulmonary vascularity.  This is likely be normal for the technique.  There is infiltration or atelectasis in both lung bases.  No blunting of costophrenic angles.  No pneumothorax. Stable appearance of right central venous catheter.  Old left rib deformities.  Surgical clips in the left axilla.  Interval resolution of previous infiltration on the left.  IMPRESSION: Shallow inspiration with infiltration or atelectasis in the lung bases.  Improved left lung infiltration since previous study.   Original Report Authenticated By: Burman Nieves, M.D.    Dg Chest Port 1v Same Day  08/29/2012  *RADIOLOGY REPORT*  Clinical Data: Fever, personal history of colon carcinoma  PORTABLE CHEST - 1 VIEW SAME DAY  Comparison: 08/29/2012 0015 hours  Findings: Cardiac shadow is stable.  A right-sided chest wall port is again seen.  The overall inspiratory effort is stable.  Multiple old rib fractures are noted on the left.  Some right basilar atelectasis is again seen with elevation of the right hemidiaphragm.  IMPRESSION: When compared with prior exam, no significant interval change is noted.   Original Report Authenticated By: Alcide Clever, M.D.    Dg Shoulder Left  09/14/2012  **ADDENDUM** CREATED: 09/14/2012 02:35:43  The focal irregularity of the tip of the scapula was present on previous CT of the chest dated 08/25/2012 and is therefore not a new finding since that time.  **END ADDENDUM** SIGNED BY: Marlon Pel, M.D.   09/14/2012  *RADIOLOGY REPORT*  Clinical Data: Left shoulder pain after fall.  LEFT SHOULDER - 2+ VIEW  Comparison: 08/25/2012  Findings:  Postoperative changes in the left proximal humerus with plate and screw fixation across to old fracture deformity with callus formation.  Mild residual varus angulation.  No significant change since previous study.  Surgical clips in the axilla.  There is cortical irregularity at the distal aspect of the scapula.  This area was not well seen on the previous study but was not definitely present.  No acute scapular fractures not excluded.  Correlation with the location of the patient's pain is recommended.  The proximal humerus, glenohumeral joint, acromioclavicular joint, and coracoclavicular space appear intact.  Visualized ribs are not displaced.  No focal bone lesion or bone destruction.  IMPRESSION: Old postoperative and post-traumatic changes in the proximal humerus.  Possible fracture of the inferior aspect of the scapula.   Original Report Authenticated By: Burman Nieves, M.D.    Dg Shoulder Left  08/25/2012  *RADIOLOGY REPORT*  Clinical Data: Fall from wheelchair.  Pain in left shoulder blade.  LEFT SHOULDER - 2+ VIEW  Comparison: Left shoulder radiographs 09/11/2007.  Findings: The patient is status post ORIF of the proximal left humerus.  The shoulders located.  No acute bone or soft tissue abnormalities are present.  The scapula appears to  be intact.  The visualized left hemithorax is clear.  IMPRESSION:  1.  No acute abnormality or significant interval change. 2.  Status post ORIF of the left humerus.   Original Report Authenticated By: Marin Roberts, M.D.   Results for orders placed during the hospital encounter of 09/13/12  URINE CULTURE      Result Value Range   Specimen Description URINE, CATHETERIZED     Special Requests NONE     Culture  Setup Time 09/14/2012 12:42     Colony Count >=100,000 COLONIES/ML     Culture       Value: METHICILLIN RESISTANT STAPHYLOCOCCUS AUREUS     Note: RIFAMPIN AND GENTAMICIN SHOULD NOT BE USED AS SINGLE DRUGS FOR TREATMENT OF STAPH INFECTIONS.  CRITICAL RESULT CALLED TO, READ BACK BY AND VERIFIED WITH: SUELLEN 09/16/12 AT 1130 AM BY [facility]   Report Status 09/16/2012 FINAL     Organism ID, Bacteria METHICILLIN RESISTANT STAPHYLOCOCCUS AUREUS    URINE CULTURE      Result Value Range   Specimen Description URINE, CATHETERIZED     Special Requests NONE     Culture  Setup Time 09/14/2012 12:42     Colony Count >=100,000 COLONIES/ML     Culture       Value: METHICILLIN RESISTANT STAPHYLOCOCCUS AUREUS     Note: RIFAMPIN AND GENTAMICIN SHOULD NOT BE USED AS SINGLE DRUGS FOR TREATMENT OF STAPH INFECTIONS. CRITICAL RESULT CALLED TO, READ BACK BY AND VERIFIED WITH: SUELLEN 09/16/12 AT 1130 AM BY [facility]   Report Status 09/16/2012 FINAL     Organism ID, Bacteria METHICILLIN RESISTANT STAPHYLOCOCCUS AUREUS    CULTURE, BLOOD (ROUTINE X 2)      Result Value Range   Specimen Description BLOOD LEFT HAND     Special Requests BOTTLES DRAWN AEROBIC ONLY      Culture  Setup Time 09/14/2012 21:00     Culture       Value: STAPHYLOCOCCUS AUREUS     Note: RIFAMPIN AND GENTAMICIN SHOULD NOT BE USED AS SINGLE DRUGS FOR TREATMENT OF STAPH INFECTIONS.     23 Note: Gram Stain Report Called to,Read Back By and Verified With:  SUE ELLEN GROUNDS 2 14 249PM [facility]   Report Status PENDING    CULTURE, BLOOD (ROUTINE X 2)      Result Value Range   Specimen Description BLOOD LEFT HAND     Special Requests BOTTLES DRAWN AEROBIC ONLY      Culture  Setup Time 09/14/2012 21:00     Culture       Value: STAPHYLOCOCCUS AUREUS     23 Note: Gram Stain Report Called to,Read Back By and Verified With: SUE ELLEN GROUNDS 2 14 249PM [facility]   Report Status PENDING    URINALYSIS, ROUTINE W REFLEX MICROSCOPIC      Result Value Range   Color, Urine AMBER (*) YELLOW   APPearance CLOUDY (*) CLEAR   Specific Gravity, Urine 1.016  1.005 - 1.030   pH 7.0  5.0 - 8.0   Glucose, UA NEGATIVE  NEGATIVE mg/dL   Hgb urine dipstick NEGATIVE  NEGATIVE   Bilirubin Urine MODERATE (*)  NEGATIVE   Ketones, ur NEGATIVE  NEGATIVE mg/dL   Protein, ur 30 (*) NEGATIVE mg/dL   Urobilinogen, UA 4.0 (*) 0.0 - 1.0 mg/dL   Nitrite POSITIVE (*) NEGATIVE   Leukocytes, UA LARGE (*) NEGATIVE  CBC WITH DIFFERENTIAL      Result Value Range   WBC 8.7  4.0 - 10.5 K/uL   RBC 3.48 (*) 4.22 - 5.81 MIL/uL   Hemoglobin 9.7 (*) 13.0 - 17.0 g/dL   HCT 16.1 (*) 09.6 - 04.5 %   MCV 84.5  78.0 - 100.0 fL   MCH 27.9  26.0 - 34.0 pg   MCHC 33.0  30.0 - 36.0 g/dL   RDW 40.9 (*) 81.1 - 91.4 %   Platelets 268  150 - 400 K/uL   Neutrophils Relative 88 (*) 43 - 77 %   Neutro Abs 7.7  1.7 - 7.7 K/uL   Lymphocytes Relative 6 (*) 12 - 46 %   Lymphs Abs 0.6 (*) 0.7 - 4.0 K/uL   Monocytes Relative 4  3 - 12 %   Monocytes Absolute 0.3  0.1 - 1.0 K/uL   Eosinophils Relative 2  0 - 5 %   Eosinophils Absolute 0.2  0.0 - 0.7 K/uL   Basophils Relative 0  0 - 1 %   Basophils Absolute 0.0  0.0 - 0.1 K/uL  COMPREHENSIVE METABOLIC PANEL      Result Value Range   Sodium 128 (*) 135 - 145 mEq/L   Potassium 3.0 (*) 3.5 - 5.1 mEq/L   Chloride 85 (*) 96 - 112 mEq/L   CO2 32  19 - 32 mEq/L   Glucose, Bld 105 (*) 70 - 99 mg/dL   BUN 7  6 - 23 mg/dL   Creatinine, Ser 7.82  0.50 - 1.35 mg/dL   Calcium 7.6 (*) 8.4 - 10.5 mg/dL   Total Protein 6.9  6.0 - 8.3 g/dL   Albumin 1.5 (*) 3.5 - 5.2 g/dL   AST 27  0 - 37 U/L   ALT 27  0 - 53 U/L   Alkaline Phosphatase 202 (*) 39 - 117 U/L   Total Bilirubin 1.2  0.3 - 1.2 mg/dL   GFR calc non Af Amer >90  >90 mL/min   GFR calc Af Amer >90  >90 mL/min  CK      Result Value Range   Total CK 114  7 - 232 U/L  URINE MICROSCOPIC-ADD ON      Result Value Range   Squamous Epithelial / LPF RARE  RARE   WBC, UA TOO NUMEROUS TO COUNT  <3 WBC/hpf   Bacteria, UA FEW (*) RARE  URINALYSIS, MICROSCOPIC ONLY      Result Value Range   Color, Urine AMBER (*) YELLOW   APPearance TURBID (*) CLEAR   Specific Gravity, Urine 1.013  1.005 - 1.030   pH 6.5  5.0 - 8.0   Glucose, UA  NEGATIVE  NEGATIVE mg/dL   Hgb urine dipstick SMALL (*) NEGATIVE   Bilirubin Urine MODERATE (*) NEGATIVE   Ketones, ur NEGATIVE  NEGATIVE mg/dL   Protein, ur 30 (*) NEGATIVE mg/dL   Urobilinogen, UA >9.5 (*) 0.0 - 1.0 mg/dL   Nitrite NEGATIVE  NEGATIVE   Leukocytes, UA LARGE (*) NEGATIVE   WBC, UA TOO NUMEROUS TO COUNT  <3 WBC/hpf   RBC / HPF 3-6  <3 RBC/hpf   Bacteria, UA MANY (*) RARE   Squamous Epithelial / LPF RARE  RARE  COMPREHENSIVE METABOLIC PANEL      Result Value Range   Sodium 130 (*) 135 - 145 mEq/L   Potassium 3.1 (*) 3.5 - 5.1 mEq/L   Chloride 89 (*) 96 - 112 mEq/L   CO2 33 (*) 19 - 32 mEq/L   Glucose, Bld 148 (*) 70 - 99 mg/dL  BUN 6  6 - 23 mg/dL   Creatinine, Ser 1.61  0.50 - 1.35 mg/dL   Calcium 7.6 (*) 8.4 - 10.5 mg/dL   Total Protein 6.9  6.0 - 8.3 g/dL   Albumin 1.6 (*) 3.5 - 5.2 g/dL   AST 30  0 - 37 U/L   ALT 25  0 - 53 U/L   Alkaline Phosphatase 218 (*) 39 - 117 U/L   Total Bilirubin 1.1  0.3 - 1.2 mg/dL   GFR calc non Af Amer >90  >90 mL/min   GFR calc Af Amer >90  >90 mL/min  LACTIC ACID, PLASMA      Result Value Range   Lactic Acid, Venous 1.7  0.5 - 2.2 mmol/L  COMPREHENSIVE METABOLIC PANEL      Result Value Range   Sodium 122 (*) 135 - 145 mEq/L   Potassium 2.9 (*) 3.5 - 5.1 mEq/L   Chloride 83 (*) 96 - 112 mEq/L   CO2 27  19 - 32 mEq/L   Glucose, Bld 142 (*) 70 - 99 mg/dL   BUN 4 (*) 6 - 23 mg/dL   Creatinine, Ser 0.96  0.50 - 1.35 mg/dL   Calcium 7.0 (*) 8.4 - 10.5 mg/dL   Total Protein 5.7 (*) 6.0 - 8.3 g/dL   Albumin 1.4 (*) 3.5 - 5.2 g/dL   AST 45 (*) 0 - 37 U/L   ALT 26  0 - 53 U/L   Alkaline Phosphatase 218 (*) 39 - 117 U/L   Total Bilirubin 1.0  0.3 - 1.2 mg/dL   GFR calc non Af Amer >90  >90 mL/min   GFR calc Af Amer >90  >90 mL/min  CBC      Result Value Range   WBC 7.6  4.0 - 10.5 K/uL   RBC 3.01 (*) 4.22 - 5.81 MIL/uL   Hemoglobin 8.4 (*) 13.0 - 17.0 g/dL   HCT 04.5 (*) 40.9 - 81.1 %   MCV 83.4  78.0 - 100.0 fL   MCH  27.9  26.0 - 34.0 pg   MCHC 33.5  30.0 - 36.0 g/dL   RDW 91.4 (*) 78.2 - 95.6 %   Platelets 217  150 - 400 K/uL  CBC      Result Value Range   WBC 7.1  4.0 - 10.5 K/uL   RBC 2.80 (*) 4.22 - 5.81 MIL/uL   Hemoglobin 8.0 (*) 13.0 - 17.0 g/dL   HCT 21.3 (*) 08.6 - 57.8 %   MCV 86.1  78.0 - 100.0 fL   MCH 28.6  26.0 - 34.0 pg   MCHC 33.2  30.0 - 36.0 g/dL   RDW 46.9 (*) 62.9 - 52.8 %   Platelets 200  150 - 400 K/uL  BASIC METABOLIC PANEL      Result Value Range   Sodium 126 (*) 135 - 145 mEq/L   Potassium 3.6  3.5 - 5.1 mEq/L   Chloride 87 (*) 96 - 112 mEq/L   CO2 29  19 - 32 mEq/L   Glucose, Bld 118 (*) 70 - 99 mg/dL   BUN 6  6 - 23 mg/dL   Creatinine, Ser 4.13  0.50 - 1.35 mg/dL   Calcium 6.9 (*) 8.4 - 10.5 mg/dL   GFR calc non Af Amer >90  >90 mL/min   GFR calc Af Amer >90  >90 mL/min  OSMOLALITY, URINE      Result Value Range   Osmolality, Ur 200 (*) 390 -  1090 mOsm/kg   A/P: Pt with staph aureus bacteremia. Plan is for port a cath removal today. Details/risks of procedure d/w pt with his understanding and consent.

## 2012-09-16 NOTE — Progress Notes (Signed)
CSW followed up with pt to discuss SNF placement.  CSW discussed that currently there are not bed offers, but there are two facilities considering.  Pt is agreeable to expanding SNF search to [facility].  Pt reports that he has previously been at a SNF in [facility], but unable to recall the name of facility.  CSW sent pt information to facilities in [place].  Pt request CSW to contact pt sister, Ambrose Mantle to notify pt sister that pt is in hospital.  CSW to follow up with pt in regard to bed offers.   CSW to continue to follow and facilitate pt discharge needs when pt medically ready for discharge and bed available at SNF.  Jacklynn Lewis, MSW, LCSWA  Clinical Social Work [phone]

## 2012-09-16 NOTE — Progress Notes (Signed)
ANTIBIOTIC CONSULT NOTE - FOLLOW UP  Pharmacy Consult for Vancomycin Indication: Staph aureus in 2/2 bcx and in urine cx  Allergies  Allergen Reactions  . Fish Allergy Anaphylaxis    Pt can tolerate shellfish  . Ibuprofen Other (See Comments)    hallucinations  . Ketorolac Tromethamine Other (See Comments)    hallucination  . Naproxen Other (See Comments)    hallucination    Patient Measurements: Height: 4' (121.9 cm) Weight: 179 lb 0.2 oz (81.2 kg) IBW/kg (Calculated) : 22.4 Adjusted Body Weight:   Vital Signs: Temp: 99.5 F (37.5 C) (02/24 1324) Temp src: Oral (02/24 1324) BP: 103/60 mmHg (02/24 1546) Pulse Rate: 89 (02/24 1546) Intake/Output from previous day: 02/23 0701 - 02/24 0700 In: 1040 [P.O.:960; I.V.:80] Out: 3225 [Urine:3225] Intake/Output from this shift:    Labs:  Recent Labs  09/14/12 0120 09/14/12 1526 09/15/12 0735 09/16/12 0640  WBC 8.7  --  7.6 7.1  HGB 9.7*  --  8.4* 8.0*  PLT 268  --  217 200  CREATININE 0.50 0.54 0.51 0.50   Estimated Creatinine Clearance: 67.7 ml/min (by C-G formula based on Cr of 0.5).  Recent Labs  09/16/12 1656  VANCOTROUGH 7.5*     Microbiology: Recent Results (from the past 720 hour(s))  URINE CULTURE     Status: None   Collection Time    09/14/12  2:04 AM      Result Value Range Status   Specimen Description URINE, CATHETERIZED   Final   Special Requests NONE   Final   Culture  Setup Time 09/14/2012 12:42   Final   Colony Count >=100,000 COLONIES/ML   Final   Culture     Final   Value: METHICILLIN RESISTANT STAPHYLOCOCCUS AUREUS     Note: RIFAMPIN AND GENTAMICIN SHOULD NOT BE USED AS SINGLE DRUGS FOR TREATMENT OF STAPH INFECTIONS. CRITICAL RESULT CALLED TO, READ BACK BY AND VERIFIED WITH: SUELLEN 09/16/12 AT 1130 AM BY [facility]   Report Status 09/16/2012 FINAL   Final   Organism ID, Bacteria METHICILLIN RESISTANT STAPHYLOCOCCUS AUREUS   Final  URINE CULTURE     Status: None   Collection Time   09/14/12  6:14 AM      Result Value Range Status   Specimen Description URINE, CATHETERIZED   Final   Special Requests NONE   Final   Culture  Setup Time 09/14/2012 12:42   Final   Colony Count >=100,000 COLONIES/ML   Final   Culture     Final   Value: METHICILLIN RESISTANT STAPHYLOCOCCUS AUREUS     Note: RIFAMPIN AND GENTAMICIN SHOULD NOT BE USED AS SINGLE DRUGS FOR TREATMENT OF STAPH INFECTIONS. CRITICAL RESULT CALLED TO, READ BACK BY AND VERIFIED WITH: SUELLEN 09/16/12 AT 1130 AM BY [facility]   Report Status 09/16/2012 FINAL   Final   Organism ID, Bacteria METHICILLIN RESISTANT STAPHYLOCOCCUS AUREUS   Final  CULTURE, BLOOD (ROUTINE X 2)     Status: None   Collection Time    09/14/12  5:45 PM      Result Value Range Status   Specimen Description BLOOD LEFT HAND   Final   Special Requests BOTTLES DRAWN AEROBIC ONLY    Final   Culture  Setup Time 09/14/2012 21:00   Final   Culture     Final   Value: METHICILLIN RESISTANT STAPHYLOCOCCUS AUREUS     Note: RIFAMPIN AND GENTAMICIN SHOULD NOT BE USED AS SINGLE DRUGS FOR TREATMENT OF STAPH INFECTIONS.  23 Note: Gram Stain Report Called to,Read Back By and Verified With:  SUE ELLEN GROUNDS 2 14 249PM [facility] CRITICAL RESULT CALLED TO, READ BACK BY AND VERIFIED WITH: GROUNDS RN 3PM 09/16/12 GUSTK   Report Status PENDING   Incomplete  CULTURE, BLOOD (ROUTINE X 2)     Status: None   Collection Time    09/14/12  5:45 PM      Result Value Range Status   Specimen Description BLOOD LEFT HAND   Final   Special Requests BOTTLES DRAWN AEROBIC ONLY    Final   Culture  Setup Time 09/14/2012 21:00   Final   Culture     Final   Value: STAPHYLOCOCCUS AUREUS     23 Note: Gram Stain Report Called to,Read Back By and Verified With: SUE ELLEN GROUNDS 2 14 249PM [facility]   Report Status PENDING   Incomplete    Anti-infectives   Start     Dose/Rate Route Frequency Ordered Stop   09/16/12 1600  micafungin (MYCAMINE) 100 mg in sodium chloride 0.9 % 100 mL  IVPB     100 mg 100 mL/hr over 1 Hours Intravenous Daily 09/16/12 1441 09/30/12 0959   09/16/12 1200  ceFAZolin (ANCEF) IVPB 1 g/50 mL premix  Status:  Discontinued     1 g 100 mL/hr over 30 Minutes Intravenous Every 8 hours 09/16/12 1011 09/16/12 1534   09/15/12 2000  levofloxacin (LEVAQUIN) tablet 750 mg  Status:  Discontinued     750 mg Oral Every 24 hours 09/15/12 1721 09/16/12 1013   09/14/12 1800  levofloxacin (LEVAQUIN) IVPB 750 mg  Status:  Discontinued     750 mg 100 mL/hr over 90 Minutes Intravenous Every 24 hours 09/14/12 1729 09/15/12 1721   09/14/12 1700  Ampicillin-Sulbactam (UNASYN) 3 g in sodium chloride 0.9 % 100 mL IVPB  Status:  Discontinued     3 g 100 mL/hr over 60 Minutes Intravenous Every 6 hours 09/14/12 1624 09/16/12 1009   09/14/12 1700  vancomycin (VANCOCIN) IVPB 1000 mg/200 mL premix     1,000 mg 200 mL/hr over 60 Minutes Intravenous Every 12 hours 09/14/12 1624     09/14/12 1100  fluconazole (DIFLUCAN) tablet 100 mg  Status:  Discontinued     100 mg Oral Daily 09/14/12 1016 09/16/12 1441   09/14/12 0530  cefTRIAXone (ROCEPHIN) 1 g in dextrose 5 % 50 mL IVPB     1 g 100 mL/hr over 30 Minutes Intravenous  Once 09/14/12 0520 09/14/12 0700      Assessment: 56 YOM andmitted 2/22 after fall and L sided pain in abdomen in chest. 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 Pt was started on unasyn/levaquin and vancomycin 2/22 for PNA/UTI/decubitus ulcers. Pt has SA in 2/2 bcx and in ucx so changing abx to Vanc and ancef. Repeat bcx ordered, 2D Echo ordered.  D3 Vancomycin 1g IV q12h  Scr wnl/stable, WBC wnl, Tm 100.1 Vancomycin trough level= 7.5 on Vancomycin 1000mg  IV q 12 hours.  Goal of Therapy:  Vancomycin trough level 15-20 mcg/ml  Plan:  1.) Increase Vancomycin to 1000mg  IV q 8 hours in anticipation of bringing the trough level into desired therapeutic range of 15-20  mcg/ml Measure antibiotic drug levels at steady state Follow up culture results  Loletta Specter 09/16/2012,6:19 PM

## 2012-09-16 NOTE — Progress Notes (Addendum)
[facility] PROGRESS NOTE  Chris [name] HQI:[id number] DOB: [birth year] DOA: 09/13/2012 PCP: Chris Stalling, MD  Assessment/Plan:  1-MRSA Bacteremia: Blood culture 2-22: Patient on vancomycin since admission day 3. length of therapy probably 4 weeks.  ECHO pending. ID following. Port Cath to be removed per ID recomendation. IR consulted. If ECHO negative, will proceed with TEE.  Repeat Blood culture 2-24.   2-Acute respiratory failure: improved. Patient requiring 4 L oxygen inittilay. Patient relates cough. Has initially treated  empirically for PNA. Received  Unasyn and levaquin for 2 days. Continue with Vancomycin day 3. 3-Hyponatremia: probably related to de dehydration. Continue with IV fluids.  4;Hypokalemia: Kcl 40 meq times 2 doses.  5-Candidemia Blood culture 2-6: Started on Micafungi 2-24. 6-Fall:  CT abdomen and pelvis: A left tenth rib fracture which is likely acute since 08/30/2012.  Improved splenic laceration with resolved perisplenic hematoma. No other acute post-traumatic deformity identified.   7-Chronic pain: he is on very high dose narcotic. Continue with  lower dose home medications. IV prn dilaudid. 8-Seizure disorder: Continue with phenobarbital.  9-Decubitus wound stage 2, small ulcer left stump: wound care consulted.  10-left tenth rib fracture//Possible fracture of the inferior aspect of the scapula: pain medication. 11-DVT prophylaxis: No anticoagulation due to recent splenic laceration. No anticoagulation for at least 6 weeks. Patient decline further IVC filter.  12-Anemia: Hb baseline 9 to 11. Repeat hb in am stable at       8. Start ferrous sulfate. 13-Left shoulder pain: X ray: Old postoperative and   post-traumatic changes in the proximal humerus.     Code Status: DNR/DNI Family Communication: Care discussed with patient.  Disposition Plan: SNF at time of discharge.   Consultants: None  Procedures:  None  Antibiotics:  Unasyn  2/  Vancomycin 2/22  Levaquin 2/22 2/24  HPI/Subjective: Complaining of generalized pain. Not feeling well.  He is agree to have TEE if needed.   Objective: Filed Vitals:   09/15/12 0800 09/15/12 1425 09/15/12 2154 09/16/12 0642  BP: 108/56 109/57 97/54 109/57  Pulse: 108 95 92 101  Temp: 99.3 F (37.4 C) 98.2 F (36.8 C) 99.2 F (37.3 C) 100.1 F (37.8 C)  TempSrc: Oral Oral Oral Oral  Resp: 22 18 16 20   Height:   4' (1.219 m)   Weight:   81.2 kg (179 lb 0.2 oz)   SpO2: 95% 95% 97% 96%    Intake/Output Summary (Last 24 hours) at 09/16/12 1036 Last data filed at 09/16/12 1324  Gross per 24 hour  Intake    740 ml  Output   2775 ml  Net  -2035 ml   Filed Weights   09/15/12 0500 09/15/12 2154  Weight: 80.8 kg (178 lb 2.1 oz) 81.2 kg (179 lb 0.2 oz)    Exam:   General:  Awake, in no distress.  Cardiovascular: S 1, S 2 RRR, tachycardic  Respiratory: Crackles bases  Abdomen: Bs present, soft, colostomy.   Bilateral AKA.  Data Reviewed: Basic Metabolic Panel:  Recent Labs Lab 09/14/12 0120 09/14/12 1526 09/15/12 0735 09/16/12 0640  NA 128* 130* 122* 126*  K 3.0* 3.1* 2.9* 3.6  CL 85* 89* 83* 87*  CO2 32 33* 27 29  GLUCOSE 105* 148* 142* 118*  BUN 7 6 4* 6  CREATININE 0.50 0.54 0.51 0.50  CALCIUM 7.6* 7.6* 7.0* 6.9*   Liver Function Tests:  Recent Labs Lab 09/14/12 0120 09/14/12 1526 09/15/12 0735  AST 27 30 45*  ALT 27  25 26  ALKPHOS 202* 218* 218*  BILITOT 1.2 1.1 1.0  PROT 6.9 6.9 5.7*  ALBUMIN 1.5* 1.6* 1.4*   CBC:  Recent Labs Lab 09/14/12 0120 09/15/12 0735 09/16/12 0640  WBC 8.7 7.6 7.1  NEUTROABS 7.7  --   --   HGB 9.7* 8.4* 8.0*  HCT 29.4* 25.1* 24.1*  MCV 84.5 83.4 86.1  PLT 268 217 200   Cardiac Enzymes:  Recent Labs Lab 09/14/12 0120  CKTOTAL 114   BNP (last 3 results)  Recent Labs  11/09/11 1510 11/09/11 1858  PROBNP 393.9* 1229.0*   CBG: No results found for this basename: GLUCAP,  in the last 168  hours  Recent Results (from the past 240 hour(s))  URINE CULTURE     Status: None   Collection Time    09/14/12  2:04 AM      Result Value Range Status   Specimen Description URINE, CATHETERIZED   Final   Special Requests NONE   Final   Culture  Setup Time 09/14/2012 12:42   Final   Colony Count >=100,000 COLONIES/ML   Final   Culture     Final   Value: STAPHYLOCOCCUS AUREUS     Note: RIFAMPIN AND GENTAMICIN SHOULD NOT BE USED AS SINGLE DRUGS FOR TREATMENT OF STAPH INFECTIONS.   Report Status PENDING   Incomplete  URINE CULTURE     Status: None   Collection Time    09/14/12  6:14 AM      Result Value Range Status   Specimen Description URINE, CATHETERIZED   Final   Special Requests NONE   Final   Culture  Setup Time 09/14/2012 12:42   Final   Colony Count >=100,000 COLONIES/ML   Final   Culture     Final   Value: STAPHYLOCOCCUS AUREUS     Note: RIFAMPIN AND GENTAMICIN SHOULD NOT BE USED AS SINGLE DRUGS FOR TREATMENT OF STAPH INFECTIONS.   Report Status PENDING   Incomplete  CULTURE, BLOOD (ROUTINE X 2)     Status: None   Collection Time    09/14/12  5:45 PM      Result Value Range Status   Specimen Description BLOOD LEFT HAND   Final   Special Requests BOTTLES DRAWN AEROBIC ONLY    Final   Culture  Setup Time 09/14/2012 21:00   Final   Culture     Final   Value: STAPHYLOCOCCUS AUREUS     Note: RIFAMPIN AND GENTAMICIN SHOULD NOT BE USED AS SINGLE DRUGS FOR TREATMENT OF STAPH INFECTIONS.     23 Note: Gram Stain Report Called to,Read Back By and Verified With:  Chris [name] 2 14 249PM [facility]   Report Status PENDING   Incomplete  CULTURE, BLOOD (ROUTINE X 2)     Status: None   Collection Time    09/14/12  5:45 PM      Result Value Range Status   Specimen Description BLOOD LEFT HAND   Final   Special Requests BOTTLES DRAWN AEROBIC ONLY    Final   Culture  Setup Time 09/14/2012 21:00   Final   Culture     Final   Value: STAPHYLOCOCCUS AUREUS     23 Note: Gram  Stain Report Called to,Read Back By and Verified With: Chris [name] 2 14 249PM [facility]   Report Status PENDING   Incomplete     Studies: Ct Abdomen Pelvis W Contrast  09/14/2012  *RADIOLOGY REPORT*  Clinical Data: Left  side abdominal pain after fall today.  History of splenic laceration.  CT ABDOMEN AND PELVIS WITH CONTRAST  Technique:  Multidetector CT imaging of the abdomen and pelvis was performed following the standard protocol during bolus administration of intravenous contrast.  Contrast: OMNIPAQUE IOHEXOL 300 MG/ML  SOLN  Comparison: 08/30/2012  Findings: Lung bases:  Mild apparent nodularity the anterior right lung base is new since the prior exam and therefore likely atelectasis.  There is increased right and similar left base dependent atelectasis.  No basilar pneumothorax.  Normal heart size with age advanced coronary artery atherosclerosis.  Calcified lower mediastinal nodes, as detailed on 08/25/2012.  Small hiatal hernia. Small pericardial effusion which is new.  Trace left pleural fluid thickening, improved.  Abdomen/pelvis:  Normal liver.  Improved appearance of the splenic laceration, with hypoattenuation identified on image 26/series 2. Resolved perisplenic hemorrhage.  Normal distal stomach.  Mild pancreatic atrophy. Cholecystectomy without biliary ductal dilatation.  Normal adrenal glands and right kidney.  An interpolar left renal cyst measures 1.8 cm.  There is also a too small to characterize lower pole left renal lesion which is not diagnostic of a simple cyst.  There is an IVC filter within the right IVC, terminating below the renal veins.  However, there is also a left IVC, which is patent and free of thrombus.  No retroperitoneal or retrocrural adenopathy.  Hartmann's pouch.  Descending colostomy. Normal terminal ileum. Normal small bowel without abdominal ascites.  Left external iliac node is borderline enlarged 1.0 cm and unchanged.  Urinary bladder collapsed and a Foley  catheter.  Normal prostate. No significant free fluid.  Bones/Musculoskeletal:  Question skin irregularity/decubitus ulcer about the right ischial region on image 100/series 2.  Osteopenia. Left proximal femoral fixation.  Left scapular tip is fractured on image 6/series 2.  This was similar on the 08/30/2012 exam.  There is also a fracture of the left tenth posterolateral rib on image 41/series 2.  This is likely new since the prior of 08/30/2012. Other left-sided rib fractures or remote.  IMPRESSION:  1.  A left tenth rib fracture which is likely acute since 08/30/2012. 2.  Improved splenic laceration with resolved perisplenic hematoma. 3.  No other acute post-traumatic deformity identified. 4.  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  The patient may benefit from a second filter in the left IVC. 5.  Subacute left-sided scapular tip fracture, incompletely imaged. 6.  New small pericardial effusion.   Original Report Authenticated By: Jeronimo Greaves, M.D.     Scheduled Meds: . sodium chloride   Intravenous Once  .  ceFAZolin (ANCEF) IV  1 g Intravenous Q8H  . fluconazole  100 mg Oral Daily  . OxyCODONE  80 mg Oral Q12H  . pantoprazole  40 mg Oral Q1200  . PHENobarbital  32.4 mg Oral Daily  . phenobarbital  64.8 mg Oral QHS  . potassium chloride  40 mEq Oral BID  . vancomycin  1,000 mg Intravenous Q12H   Continuous Infusions: . sodium chloride 100 mL/hr at 09/15/12 1802    Active Problems:   Seizure disorder   Acute respiratory failure with hypoxia   Hyponatremia   Hypokalemia   S/P BKA (below knee amputation) bilateral    Time spent: 25 minutes.    Taite Schoeppner  [facility] Pager [phone]. If 8PM-8AM, please contact night-coverage at [email or web address], password [facility] 09/16/2012, 10:36 AM  LOS: 3 days
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
## 2012-09-16 NOTE — Procedures (Signed)
R port removal No complication No blood loss. See complete dictation in [facility] PACS.  

## 2012-09-17 ENCOUNTER — Inpatient Hospital Stay (HOSPITAL_COMMUNITY): Payer: Medicaid Other

## 2012-09-17 DIAGNOSIS — A4902 Methicillin resistant Staphylococcus aureus infection, unspecified site: Secondary | ICD-10-CM

## 2012-09-17 DIAGNOSIS — R7881 Bacteremia: Secondary | ICD-10-CM

## 2012-09-17 LAB — CULTURE, BLOOD (ROUTINE X 2)

## 2012-09-17 LAB — BASIC METABOLIC PANEL
BUN: 6 mg/dL (ref 6–23)
Calcium: 7.5 mg/dL — ABNORMAL LOW (ref 8.4–10.5)
GFR calc Af Amer: >90 mL/min (ref >90)
GFR calc non Af Amer: >90 mL/min (ref >90)
Glucose, Bld: 128 mg/dL — ABNORMAL HIGH (ref 70–99)
Potassium: 3.7 mEq/L (ref 3.5–5.1)
Sodium: 129 mEq/L — ABNORMAL LOW (ref 135–145)

## 2012-09-17 MED ORDER — ENSURE COMPLETE PO LIQD
237.0000 mL | Freq: Four times a day (QID) | ORAL | Status: DC
  Administered 2012-09-17 – 2012-09-21 (×13): 237 mL via ORAL

## 2012-09-17 MED ORDER — HEPARIN SODIUM (PORCINE) 5000 UNIT/ML IJ SOLN
5000.0000 [IU] | Freq: Three times a day (TID) | INTRAMUSCULAR | Status: DC
  Administered 2012-09-17 – 2012-09-19 (×3): 5000 [IU] via SUBCUTANEOUS
  Filled 2012-09-17 (×15): qty 1
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
## 2012-09-17 NOTE — Progress Notes (Signed)
CRITICAL VALUE ALERT  Critical value received:  Blood cultures positive for gram+ coci in clusters  Date of notification:  09/17/12  Time of notification:  2355  Critical value read back:yes  Nurse who received alert:  Oneita Jolly, RN  MD notified (1st page):  Maren Reamer, NP  Time of first page:  2356  MD notified (2nd page):  Time of second page:  Responding MD:    Time MD responded:

## 2012-09-17 NOTE — Progress Notes (Signed)
INITIAL NUTRITION ASSESSMENT  DOCUMENTATION CODES Per approved criteria  -Not Applicable   INTERVENTION: - Recommend re-weigh pt as last documented weight in previous admission was 207 pounds - Ensure Complete QID - Encouraged pt to continue to eat well  - Will monitor intake  NUTRITION DIAGNOSIS: Increased nutrient needs related to stage 2 sacral pressure ulcer as evidenced by RN documentation.   Goal: Pt to consume >90% of meals/supplements  Monitor:  Weights, labs, intake  Reason for Assessment: Nutrition risk   57 y.o. male  Admitting Dx: Fall, left sided abdominal pain  ASSESSMENT: Pt with bilateral above the knee amputation and colostomy. Pt at [facility] earlier this month during which time he weighed 207 pounds, now weighs 185 pounds. Pt reports he was unable to eat for 4 days at home related to having a fall at home. Pt reports before then he was eating 2-3 meals/day and drinking 4 Ensure/day. Pt reports left above the knee amputation occurred in 2000 and the right above the knee amputation occurred in 2011. Pt reports eating well during admission, 100% of meals.   Height: Ht Readings from Last 1 Encounters:  09/15/12 4' (1.219 m)   Per past medical records pt was 27' 1'' before bilateral above the knee amputations.   Weight: Wt Readings from Last 1 Encounters:  09/17/12 185 lb 13.6 oz (84.3 kg)    Ideal Body Weight: 126 lb with bilateral above the knee amputation   % Ideal Body Weight: 147  Wt Readings from Last 10 Encounters:  09/17/12 185 lb 13.6 oz (84.3 kg)  09/02/12 205 lb 8 oz (93.214 kg)  11/10/11 168 lb 6.9 oz (76.4 kg)    Usual Body Weight: 205 lb earlier this month  % Usual Body Weight: 90  BMI:  Body mass index is 56.73 kg/(m^2). not appropriate as pt with bilateral above the knee amputation   Estimated Nutritional Needs: Kcal: 1700-1850 Protein: 70-85g Fluid: 1.7-1.8L/day  Skin: Stage 2 pressure ulcer on sacrum  Diet Order:  General  EDUCATION NEEDS: -No education needs identified at this time   Intake/Output Summary (Last 24 hours) at 09/17/12 1206 Last data filed at 09/17/12 0647  Gross per 24 hour  Intake    440 ml  Output   5200 ml  Net  -4760 ml    Last BM: 2/23  Labs:   Recent Labs Lab 09/14/12 1526 09/15/12 0735 09/16/12 0640  NA 130* 122* 126*  K 3.1* 2.9* 3.6  CL 89* 83* 87*  CO2 33* 27 29  BUN 6 4* 6  CREATININE 0.54 0.51 0.50  CALCIUM 7.6* 7.0* 6.9*  GLUCOSE 148* 142* 118*    CBG (last 3)  No results found for this basename: GLUCAP,  in the last 72 hours  Scheduled Meds: . sodium chloride   Intravenous Once  . carisoprodol  350 mg Oral TID  . ferrous sulfate  325 mg Oral BID WC  . micafungin (MYCAMINE) IV  100 mg Intravenous Daily  . OxyCODONE  80 mg Oral Q12H  . pantoprazole  40 mg Oral Q1200  . PHENobarbital  32.4 mg Oral Daily  . phenobarbital  64.8 mg Oral QHS  . vancomycin  1,000 mg Intravenous Q8H    Continuous Infusions: . sodium chloride 100 mL/hr at 09/15/12 1610    Past Medical History  Diagnosis Date  . Colon cancer   . Seizures   . Presence of IVC filter 2011    Past Surgical History  Procedure  Laterality Date  . Colostomy       Levon Hedger MS, RD, LDN [phone] Pager [phone] After Hours Pager
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
## 2012-09-17 NOTE — Consult Note (Signed)
Reason for Consult: Left shoulder pain Referring Physician: Dr. Jacqulyn Liner Chris [name] is an 56 y.o. male.  HPI: Chris [name] is a 57 year old patient with multiple medical problems. Orthopedics is consults and for evaluation of left shoulder pain. The patient sustained a fall approximately 5 days ago from a wheelchair. This is a low energy mechanism of injury but he did sustain a splenic laceration from the fall. He has been hospitalized with MRSA bacteremia and other medical problems since that time he describes acute onset of left shoulder pain since that time. He had a history of [place] shoulder fracture [place] many years ago. He describes a significant pain with range of motion of the left shoulder since the fall. He did fall on the left-hand side. He denies any numbness and tingling in the hand.  Past Medical History  Diagnosis Date  . Colon cancer   . Seizures   . Presence of IVC filter 2011    Past Surgical History  Procedure Laterality Date  . Colostomy      Family History  Problem Relation Age of Onset  . Heart failure Sister     Social History:  reports that he has been smoking.  He does not have any smokeless tobacco history on file. He reports that he drinks about 1.5 ounces of alcohol per week. He reports that he does not use illicit drugs.  Allergies:  Allergies  Allergen Reactions  . Fish Allergy Anaphylaxis    Pt can tolerate shellfish  . Ibuprofen Other (See Comments)    hallucinations  . Ketorolac Tromethamine Other (See Comments)    hallucination  . Naproxen Other (See Comments)    hallucination    Medications: I have reviewed the patient's current medications.  Results for orders placed during the hospital encounter of 09/13/12 (from the past 48 hour(s))  OSMOLALITY, URINE     Status: Abnormal   Collection Time    09/16/12  6:30 AM      Result Value Range   Osmolality, Ur 200 (*) 390 - 1090 mOsm/kg  CBC     Status: Abnormal   Collection Time   09/16/12  6:40 AM      Result Value Range   WBC 7.1  4.0 - 10.5 K/uL   RBC 2.80 (*) 4.22 - 5.81 MIL/uL   Hemoglobin 8.0 (*) 13.0 - 17.0 g/dL   HCT 16.1 (*) 09.6 - 04.5 %   MCV 86.1  78.0 - 100.0 fL   MCH 28.6  26.0 - 34.0 pg   MCHC 33.2  30.0 - 36.0 g/dL   RDW 40.9 (*) 81.1 - 91.4 %   Platelets 200  150 - 400 K/uL  BASIC METABOLIC PANEL     Status: Abnormal   Collection Time    09/16/12  6:40 AM      Result Value Range   Sodium 126 (*) 135 - 145 mEq/L   Potassium 3.6  3.5 - 5.1 mEq/L   Comment: DELTA CHECK NOTED     REPEATED TO VERIFY     NO VISIBLE HEMOLYSIS   Chloride 87 (*) 96 - 112 mEq/L   CO2 29  19 - 32 mEq/L   Glucose, Bld 118 (*) 70 - 99 mg/dL   BUN 6  6 - 23 mg/dL   Creatinine, Ser 7.82  0.50 - 1.35 mg/dL   Calcium 6.9 (*) 8.4 - 10.5 mg/dL   GFR calc non Af Amer >90  >90 mL/min   GFR calc  Af Amer >90  >90 mL/min   Comment:            The eGFR has been calculated     using the CKD EPI equation.     This calculation has not been     validated in all clinical     situations.     eGFR's persistently     <90 mL/min signify     possible Chronic Kidney Disease.  CULTURE, BLOOD (ROUTINE X 2)     Status: None   Collection Time    09/16/12 10:40 AM      Result Value Range   Specimen Description BLOOD RIGHT HAND     Special Requests BOTTLES DRAWN AEROBIC ONLY 1CC     Culture  Setup Time 09/16/2012 14:05     Culture       Value:        BLOOD CULTURE RECEIVED NO GROWTH TO DATE CULTURE WILL BE HELD FOR 5 DAYS BEFORE ISSUING A FINAL NEGATIVE REPORT   Report Status PENDING    CULTURE, BLOOD (ROUTINE X 2)     Status: None   Collection Time    09/16/12 10:45 AM      Result Value Range   Specimen Description BLOOD LEFT HAND     Special Requests BOTTLES DRAWN AEROBIC ONLY 1CC     Culture  Setup Time 09/16/2012 14:05     Culture       Value:        BLOOD CULTURE RECEIVED NO GROWTH TO DATE CULTURE WILL BE HELD FOR 5 DAYS BEFORE ISSUING A FINAL NEGATIVE REPORT   Report  Status PENDING    PROTIME-INR     Status: None   Collection Time    09/16/12  1:32 PM      Result Value Range   Prothrombin Time 14.6  11.6 - 15.2 seconds   INR 1.16  0.00 - 1.49  APTT     Status: Abnormal   Collection Time    09/16/12  1:32 PM      Result Value Range   aPTT 38 (*) 24 - 37 seconds   Comment:            IF BASELINE aPTT IS ELEVATED,     SUGGEST PATIENT RISK ASSESSMENT     BE USED TO DETERMINE APPROPRIATE     ANTICOAGULANT THERAPY.  VANCOMYCIN, TROUGH     Status: Abnormal   Collection Time    09/16/12  4:56 PM      Result Value Range   Vancomycin Tr 7.5 (*) 10.0 - 20.0 ug/mL  BASIC METABOLIC PANEL     Status: Abnormal   Collection Time    09/17/12 11:39 AM      Result Value Range   Sodium 129 (*) 135 - 145 mEq/L   Potassium 3.7  3.5 - 5.1 mEq/L   Chloride 93 (*) 96 - 112 mEq/L   CO2 28  19 - 32 mEq/L   Glucose, Bld 128 (*) 70 - 99 mg/dL   BUN 6  6 - 23 mg/dL   Creatinine, Ser 1.61 (*) 0.50 - 1.35 mg/dL   Calcium 7.5 (*) 8.4 - 10.5 mg/dL   GFR calc non Af Amer >90  >90 mL/min   GFR calc Af Amer >90  >90 mL/min   Comment:            The eGFR has been calculated     using the CKD EPI equation.  This calculation has not been     validated in all clinical     situations.     eGFR's persistently     <90 mL/min signify     possible Chronic Kidney Disease.    Ir Removal [employer] W/o [place] Mod Sed  09/16/2012  *RADIOLOGY REPORT*  Clinical data:  Septicemia.  TUNNELED PORT CATHETER REMOVAL:  Technique and findings: The procedure, risks (including but not limited to bleeding, infection, organ damage), benefits, and alternatives were explained to the patient.  Questions regarding the procedure were encouraged and answered.  The patient understands and consents to the procedure.  Intravenous Fentanyl and Versed were administered as conscious sedation during continuous cardiorespiratory monitoring by the radiology RN, with a total moderate sedation time of  24 minutes.  Overlying skin prepped with chlorhexidine, draped in usual sterile fashion, infiltrated locally with 1% lidocaine. A small incision was made over the   scar from previous placement. The port catheter was dissected free from the underlying soft tissues and removed intact. Hemostasis was achieved. The port pocket was closed with deep interrupted and subcuticular continuous 3-0 Monocryl sutures, then covered with Dermabond.  The patient tolerated the procedure well, without any complication.  IMPRESSION:  1. Technically successful tunneled Port catheter removal.   Original Report Authenticated By: D. Andria Rhein, MD     Review of Systems  Constitutional: Positive for fever.  HENT: Negative.   Eyes: Negative.   Respiratory: Positive for cough.   Cardiovascular: Negative.   Gastrointestinal: Negative.   Musculoskeletal: Positive for joint pain.  Skin: Negative.   Neurological: Negative.   Psychiatric/Behavioral: Negative.    Blood pressure 96/62, pulse 88, temperature 97.8 F (36.6 C), temperature source Oral, resp. rate 16, height 4' (1.219 m), weight 84.3 kg (185 lb 13.6 oz), SpO2 99.00%. Physical Exam  Constitutional: He appears well-developed.  HENT:  Head: Normocephalic.  Eyes: Pupils are equal, round, and reactive to light.  Neck: Normal range of motion.  Cardiovascular: Normal rate.   Respiratory: Effort normal.   left shoulder exam demonstrates well-healed surgical incision from deltopectoral approach radial pulse intact wrist elbow range of motion left-hand side intact range of motion painful he can achieve for flexion and 90 but it's painful plate is palpable but not prominent laterally. Is does have decreased rotator cuff strength. 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  Assessment/Plan: Impression is acute onset left shoulder pain following fall with history of fracture. My review CT scan does not show any acute  fracture and the head and neck region but I will await final radiologist's reading. The plate is palpable but not necessarily prominent but he states that is potentially a newer finding. I think it's conceivable that from the fall he may have injured his rotator cuff. His current medical condition if that is the case rehabilitation is indicated and not any type of acute surgical intervention. Be difficult to assess because of the presence of metal in in the which would negate her ability to do an MRI scan. The metal in place the stainless steel. Await reading on the CT scan but in all likelihood rehabilitation and physical therapy is indicated for this injury.  Chris [name] 09/17/2012, 5:40 PM

## 2012-09-17 NOTE — Progress Notes (Addendum)
[facility] PROGRESS NOTE  Sal Chris [name] NFA:[id number] DOB: April 17, 1956 DOA: 09/13/2012 PCP: [name], MD  Assessment/Plan:  Chris [name] is a 57 y.o. 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 He was trying to transfer himself from his bed to his wheelchair and fell in between hitting the bed rail. He hit his left side chest, abdomen. He has pain left side since then. Patient was in the ED since 2-21 11 PM, waiting for SW evaluation, placement.  Patient spike a tempeture at 102 on 09-14-2012, [facility] was ask to admit patient.  Patient was getting treatment initially for health care associate pneumonia because he developed hypoxemia, respiratory failure, was complaing of cough. He was found to have MRSA bacteremia, hyponatremia. Patient is currently on IV vancomycin. Also he was started on  Micafungi 2-24 for candidemia blood culture from 08-29-2012. Patient need TEE. [facility] consulted for TEE. Patient spike fever 2-25, ID helping with antibiotics and patient care.     1-MRSA Bacteremia: Blood culture 2-22: Patient on vancomycin since admission day 4. length of therapy probably 4 weeks.  ECHO negative for vegetation. ID following. Port Cath  Removed by IR  2-24 per ID recomendation. [facility] consulted for  TEE. Repeated Blood culture 2-24 no growth to date.   2-Acute respiratory failure: improved. Patient requiring 4 L oxygen inittilay. Patient relates cough. Was initially treated  empirically for PNA. Received  Unasyn and levaquin for 2 days. Continue with Vancomycin day 4. ID recommended to stop Levaquin and Unasyn.  3-Hyponatremia: probably related to de  dehydration. Continue with IV fluids. B-met pending.  4;Hypokalemia: Kcl 40 meq times 2 doses. Repeat b-met pending.  5-Candidemia Blood culture 2-6: Started on Micafungi 2-24. Day 2.  6-Fall:  CT abdomen and pelvis: A left tenth rib fracture which is likely acute since 08/30/2012.  Improved splenic laceration with resolved perisplenic hematoma. No other acute post-traumatic deformity identified.  7-Chronic pain: he is on very high dose narcotic. Continue with  lower dose home medications. IV prn dilaudid. Resume soma 2-24. Increase xanax to home dose 2-24.  8-Seizure disorder: Continue with phenobarbital.  9-Decubitus wound stage 2, small ulcer left stump: wound care consulted. Will re consult wound care I don't see a note in the chart.  10-left tenth rib fracture//Possible fracture of the inferior aspect of the scapula: pain medication. 11-DVT prophylaxis: Patient decline further IVC filter, he has 2 IVC, a variant anatomic finding. I discussed case with Dr Ezzard Standing, he think heparin for DVT prophylaxis will be ok, splenic laceration appears healing. Will need to monitor HB level. Marland Kitchen  12-Anemia: Hb baseline 9 to 11. Repeat hb in am stable at 8. Start ferrous sulfate. 13-Left shoulder pain: X ray: Old postoperative and   post-traumatic changes in the proximal humerus. Possible fracture of the inferior aspect of the scapula: will ask ortho for recommendation. Dr August Saucer consulted, [facility], he recommend Ct left shoulder.     Code Status: DNR/DNI Family Communication: Care discussed with patient.  Disposition Plan: SNF at time of discharge.   Consultants: Dr Drue Second, ID. Dr Deanne Coffer, IR.  Procedures:  Port Cath  Removed by IR  2-24   Antibiotics:  Unasyn 2/22>>>2/24  Vancomycin 2/22  Levaquin 2/22>>>  2/24  HPI/Subjective: Feeling better today. Still complaining of left shoulder pain, unable to move arm. He agree to have TEE.   Objective: Filed Vitals:   09/16/12 1536 09/16/12  1546 09/16/12 2204 09/17/12 0642  BP: 104/57 103/60 114/62 116/62  Pulse: 88 89 101 95  Temp:   98.6 F (37 C) 102.6 F (39.2 C)  TempSrc:   Oral Oral  Resp: 13 14 22 16   Height:      Weight:    84.3 kg (185 lb 13.6 oz)  SpO2: 98% 98% 98% 97%    Intake/Output Summary (Last 24 hours) at 09/17/12 0945 Last data filed at 09/17/12 0647  Gross per 24 hour  Intake    440 ml  Output   5200 ml  Net  -4760 ml   Filed Weights   09/15/12 0500 09/15/12 2154 09/17/12 0642  Weight: 80.8 kg (178 lb 2.1 oz) 81.2 kg (179 lb 0.2 oz) 84.3 kg (185 lb 13.6 oz)    Exam:   General:  Awake, in no distress.  Cardiovascular: S 1, S 2 RRR, tachycardic  Respiratory: Crackles bases  Abdomen: Bs present, soft, colostomy.   Bilateral AKA.  Data Reviewed: Basic Metabolic Panel:  Recent Labs Lab 09/14/12 0120 09/14/12 1526 09/15/12 0735 09/16/12 0640  NA 128* 130* 122* 126*  K 3.0* 3.1* 2.9* 3.6  CL 85* 89* 83* 87*  CO2 32 33* 27 29  GLUCOSE 105* 148* 142* 118*  BUN 7 6 4* 6  CREATININE 0.50 0.54 0.51 0.50  CALCIUM 7.6* 7.6* 7.0* 6.9*   Liver Function Tests:  Recent Labs Lab 09/14/12 0120 09/14/12 1526 09/15/12 0735  AST 27 30 45*  ALT 27 25 26   ALKPHOS 202* 218* 218*  BILITOT 1.2 1.1 1.0  PROT 6.9 6.9 5.7*  ALBUMIN 1.5* 1.6* 1.4*   CBC:  Recent Labs Lab 09/14/12 0120 09/15/12 0735 09/16/12 0640  WBC 8.7 7.6 7.1  NEUTROABS 7.7  --   --   HGB 9.7* 8.4* 8.0*  HCT 29.4* 25.1* 24.1*  MCV 84.5 83.4 86.1  PLT 268 217 200   Cardiac Enzymes:  Recent Labs Lab 09/14/12 0120  CKTOTAL 114   BNP (last 3 results)  Recent Labs  11/09/11 1510 11/09/11 1858  PROBNP 393.9* 1229.0*   CBG: No results found for this basename: GLUCAP,  in the last 168 hours  Recent Results (from the past 240 hour(s))  URINE CULTURE     Status: None   Collection Time    09/14/12  2:04 AM      Result Value Range Status   Specimen Description URINE, CATHETERIZED   Final   Special  Requests NONE   Final   Culture  Setup Time 09/14/2012 12:42   Final   Colony Count >=100,000 COLONIES/ML   Final   Culture     Final   Value: METHICILLIN RESISTANT STAPHYLOCOCCUS AUREUS     Note: RIFAMPIN AND GENTAMICIN SHOULD NOT BE USED AS SINGLE DRUGS FOR TREATMENT OF STAPH INFECTIONS. CRITICAL RESULT CALLED TO, READ BACK BY AND VERIFIED WITH: SUELLEN 09/16/12 AT 1130 AM BY [facility]   Report Status 09/16/2012 FINAL   Final   Organism ID, Bacteria METHICILLIN RESISTANT STAPHYLOCOCCUS AUREUS   Final  URINE CULTURE     Status: None   Collection Time    09/14/12  6:14 AM      Result Value Range Status   Specimen Description URINE, CATHETERIZED   Final   Special Requests NONE  Final   Culture  Setup Time 09/14/2012 12:42   Final   Colony Count >=100,000 COLONIES/ML   Final   Culture     Final   Value: METHICILLIN RESISTANT STAPHYLOCOCCUS AUREUS     Note: RIFAMPIN AND GENTAMICIN SHOULD NOT BE USED AS SINGLE DRUGS FOR TREATMENT OF STAPH INFECTIONS. CRITICAL RESULT CALLED TO, READ BACK BY AND VERIFIED WITH: SUELLEN 09/16/12 AT 1130 AM BY [facility]   Report Status 09/16/2012 FINAL   Final   Organism ID, Bacteria METHICILLIN RESISTANT STAPHYLOCOCCUS AUREUS   Final  CULTURE, BLOOD (ROUTINE X 2)     Status: None   Collection Time    09/14/12  5:45 PM      Result Value Range Status   Specimen Description BLOOD LEFT HAND   Final   Special Requests BOTTLES DRAWN AEROBIC ONLY    Final   Culture  Setup Time 09/14/2012 21:00   Final   Culture     Final   Value: METHICILLIN RESISTANT STAPHYLOCOCCUS AUREUS     Note: RIFAMPIN AND GENTAMICIN SHOULD NOT BE USED AS SINGLE DRUGS FOR TREATMENT OF STAPH INFECTIONS. DAPTOMYCIN PENDING     23 Note: Gram Stain Report Called to,Read Back By and Verified With:  SUE ELLEN GROUNDS 2 14 249PM [facility] CRITICAL RESULT CALLED TO, READ BACK BY AND VERIFIED WITH: GROUNDS RN 3PM 09/16/12 [facility]   Report Status PENDING   Incomplete   Organism ID, Bacteria METHICILLIN RESISTANT  STAPHYLOCOCCUS AUREUS   Final  CULTURE, BLOOD (ROUTINE X 2)     Status: None   Collection Time    09/14/12  5:45 PM      Result Value Range Status   Specimen Description BLOOD LEFT HAND   Final   Special Requests BOTTLES DRAWN AEROBIC ONLY    Final   Culture  Setup Time 09/14/2012 21:00   Final   Culture     Final   Value: STAPHYLOCOCCUS AUREUS     Note: SUSCEPTIBILITIES PERFORMED ON PREVIOUS CULTURE WITHIN THE LAST 5 DAYS.     23 Note: Gram Stain Report Called to,Read Back By and Verified With: SUE ELLEN GROUNDS 2 14 249PM [facility]   Report Status 09/17/2012 FINAL   Final  CULTURE, BLOOD (ROUTINE X 2)     Status: None   Collection Time    09/16/12 10:40 AM      Result Value Range Status   Specimen Description BLOOD RIGHT HAND   Final   Special Requests BOTTLES DRAWN AEROBIC ONLY 1CC   Final   Culture  Setup Time 09/16/2012 14:05   Final   Culture     Final   Value:        BLOOD CULTURE RECEIVED NO GROWTH TO DATE CULTURE WILL BE HELD FOR 5 DAYS BEFORE ISSUING A FINAL NEGATIVE REPORT   Report Status PENDING   Incomplete  CULTURE, BLOOD (ROUTINE X 2)     Status: None   Collection Time    09/16/12 10:45 AM      Result Value Range Status   Specimen Description BLOOD LEFT HAND   Final   Special Requests BOTTLES DRAWN AEROBIC ONLY 1CC   Final   Culture  Setup Time 09/16/2012 14:05   Final   Culture     Final   Value:        BLOOD CULTURE RECEIVED NO GROWTH TO DATE CULTURE WILL BE HELD FOR 5 DAYS BEFORE ISSUING A FINAL NEGATIVE REPORT   Report Status PENDING  Incomplete     Studies: Ir Removal Gap Inc W/o Fl Mod Sed  09/16/2012  *RADIOLOGY REPORT*  Clinical data:  Septicemia.  TUNNELED PORT CATHETER REMOVAL:  Technique and findings: The procedure, risks (including but not limited to bleeding, infection, organ damage), benefits, and alternatives were explained to the patient.  Questions regarding the procedure were encouraged and answered.  The patient understands and  consents to the procedure.  Intravenous Fentanyl and Versed were administered as conscious sedation during continuous cardiorespiratory monitoring by the radiology RN, with a total moderate sedation time of 24 minutes.  Overlying skin prepped with chlorhexidine, draped in usual sterile fashion, infiltrated locally with 1% lidocaine. A small incision was made over the   scar from previous placement. The port catheter was dissected free from the underlying soft tissues and removed intact. Hemostasis was achieved. The port pocket was closed with deep interrupted and subcuticular continuous 3-0 Monocryl sutures, then covered with Dermabond.  The patient tolerated the procedure well, without any complication.  IMPRESSION:  1. Technically successful tunneled Port catheter removal.   Original Report Authenticated By: D. Andria Rhein, MD     Scheduled Meds: . sodium chloride   Intravenous Once  . carisoprodol  350 mg Oral TID  . ferrous sulfate  325 mg Oral BID WC  . micafungin (MYCAMINE) IV  100 mg Intravenous Daily  . OxyCODONE  80 mg Oral Q12H  . pantoprazole  40 mg Oral Q1200  . PHENobarbital  32.4 mg Oral Daily  . phenobarbital  64.8 mg Oral QHS  . potassium chloride  40 mEq Oral BID  . vancomycin  1,000 mg Intravenous Q8H   Continuous Infusions: . sodium chloride 100 mL/hr at 09/15/12 1802    Active Problems:   Seizure disorder   Acute respiratory failure with hypoxia   Hyponatremia   Hypokalemia   S/P BKA (below knee amputation) bilateral    Time spent: 25 minutes.    Audiel Scheiber  [facility] Pager [phone]. If 8PM-8AM, please contact night-coverage at [email or web address], password [facility] 09/17/2012, 9:45 AM  LOS: 4 days

## 2012-09-18 DIAGNOSIS — L8994 Pressure ulcer of unspecified site, stage 4: Secondary | ICD-10-CM | POA: Diagnosis present

## 2012-09-18 DIAGNOSIS — A419 Sepsis, unspecified organism: Secondary | ICD-10-CM | POA: Diagnosis present

## 2012-09-18 DIAGNOSIS — N390 Urinary tract infection, site not specified: Secondary | ICD-10-CM | POA: Diagnosis present

## 2012-09-18 DIAGNOSIS — W19XXXA Unspecified fall, initial encounter: Secondary | ICD-10-CM

## 2012-09-18 HISTORY — DX: Sepsis, unspecified organism: A41.9

## 2012-09-18 HISTORY — DX: Pressure ulcer of unspecified site, stage 4: L89.94

## 2012-09-18 HISTORY — DX: Unspecified fall, initial encounter: W19.XXXA

## 2012-09-18 LAB — BASIC METABOLIC PANEL
BUN: 5 mg/dL — ABNORMAL LOW (ref 6–23)
Chloride: 89 mEq/L — ABNORMAL LOW (ref 96–112)
Creatinine, Ser: 0.49 mg/dL — ABNORMAL LOW (ref 0.50–1.35)
GFR calc Af Amer: >90 mL/min (ref >90)
GFR calc non Af Amer: >90 mL/min (ref >90)
Potassium: 3.7 mEq/L (ref 3.5–5.1)

## 2012-09-18 LAB — CBC
HCT: 27.5 % — ABNORMAL LOW (ref 39.0–52.0)
MCHC: 32 g/dL (ref 30.0–36.0)
Platelets: ADEQUATE 10*3/uL (ref 150–400)
RDW: 17.5 % — ABNORMAL HIGH (ref 11.5–15.5)
WBC: 7.5 10*3/uL (ref 4.0–10.5)

## 2012-09-18 LAB — VANCOMYCIN, TROUGH: Vancomycin Tr: 13.8 ug/mL (ref 10.0–20.0)

## 2012-09-18 MED ORDER — HYDROMORPHONE HCL PF 1 MG/ML IJ SOLN
1.0000 mg | INTRAMUSCULAR | Status: DC | PRN
  Administered 2012-09-18 – 2012-09-21 (×15): 1 mg via INTRAVENOUS
  Filled 2012-09-18 (×16): qty 1

## 2012-09-18 MED ORDER — SODIUM CHLORIDE 0.9 % IJ SOLN: 10.0000 mL | INTRAMUSCULAR | Status: DC | PRN

## 2012-09-18 MED ORDER — SODIUM CHLORIDE 0.9 % IJ SOLN
10.0000 mL | Freq: Two times a day (BID) | INTRAMUSCULAR | Status: DC
  Administered 2012-09-21: 10 mL

## 2012-09-18 MED ORDER — SODIUM CHLORIDE 0.9 % IV SOLN
1250.0000 mg | Freq: Three times a day (TID) | INTRAVENOUS | Status: DC
  Administered 2012-09-19 – 2012-09-21 (×7): 1250 mg via INTRAVENOUS
  Filled 2012-09-18 (×9): qty 1250

## 2012-09-18 NOTE — Progress Notes (Signed)
[facility] PROGRESS NOTE  Chris [name] WUX:[id number] DOB: 1956/01/26 DOA: 09/13/2012 PCP: [name], MD  Brief narrative: Chris [name] is an 57 y.o. male with a past medical history of bilateral amputations, colon cancer status post colostomy, seizure disorder, chronic indwelling Foley catheter, pulmonary embolism status post IVC filter, recent hospitalization after a fall complicated by spleen laceration, VRE, B-cell lymphoma (Port-A-Cath placed in 2013, chemotherapy discontinued secondary to cardiac MI), who was admitted to the hospital on 09/14/2012 after another fall. Upon evaluation in the emergency department, the patient spiked a fever to 102. Urine and blood cultures were both positive for MRSA.  Assessment/Plan: Principal Problem:   Sepsis with complicated MRSA bacteremia and candidemia -ID consultation requested and performed by Dr. Judyann Munson on 09/16/2012. Blood cultures x2 positive for MRSA 09/14/2012.  Repeat blood cultures 09/16/2012 show one of 2 positive for gram-positive cocci in clusters.  Prior blood cultures from 08/29/2012 grew Candida. -Indwelling Port-A-Cath removed by interventional radiology 09/16/2012. -Continue vancomycin for MRSA with a goal trough of 15-20. Active treatment: 4 weeks. -Continue micafungin for at least 2 weeks. -For transesophageal echocardiogram 09/19/2012 to rule out endocarditis. Active Problems:   Status post fall / left 10th rib fracture / left scapular fracture -CT scan of the abdomen and pelvis showed a left 10th rib fracture, improved splenic laceration with resolved perisplenic hematoma. No other acute post traumatic deformities identified. -Evaluated by Dr. August Saucer of orthopedics on 09/17/2012 with no surgical indication felt to be identified. -Physical therapy when able to participate.   Stage IV decubitus ulcer left and right ischium / Deep tissue injury sacrum / Left stump partial thickness burn -Per [facility] RN recommendations.  (Seen and evaluated on 09/17/12).   Encephalopathy acute -Resolved.   Seizure disorder -No evidence on phenobarbital.   Acute respiratory failure with hypoxia -Initially there were concerns for healthcare associated pneumonia. Received Unasyn and Levaquin for 2 days. No evidence of pneumonia and therefore ID recommended discontinuation of Unasyn and Levaquin. -Continue supplemental oxygen and bronchodilators as needed.   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   Hypokalemia -Monitor and replace potassium as needed.   S/P BKA (below knee amputation) bilateral -Physical therapy evaluations when able.   MRSA UTI (urinary tract infection) -Admission urinalysis showed too numerous to count white blood cells, so he was admitted and empirically placed on Unasyn. -Urine cultures ultimately grew MRSA.   Chronic pain -Continue current pain management with narcotics, soma.   Normocytic anemia -Likely AOCD.  No current indication for transfusion.  Code Status: DNR Family Communication: None at bedside. Disposition Plan: SNF.   Medical Consultants:  Dr. Judyann Munson, ID  Dr. Cammy Copa, Orthopedics  Dr. Deanne Coffer, Gerald Leitz  [facility]  Anti-infectives:  Unasyn 09/14/2012---> 09/16/2012  Vancomycin 09/14/2012--->  Levaquin 09/14/2012---> 09/16/2012  Micafungin 09/16/2012--->  HPI/Subjective: Chris [name] tells me his pain is uncontrolled in the left rib cage area and left shoulder. No nausea or vomiting. Bowels are moving. Appetite is okay.  Objective: Filed Vitals:   09/17/12 2027 09/18/12 0211 09/18/12 0326 09/18/12 0508  BP: 118/74   107/57  Pulse: 100   95  Temp: 97.5 F (36.4 C) 100.6 F (38.1 C) 97.2 F (36.2 C) 98.3 F (36.8 C)  TempSrc: Oral Axillary Oral Oral  Resp: 18   18  Height:      Weight:      SpO2: 100%   97%    Intake/Output Summary (Last 24 hours) at 09/18/12 1100 Last data filed at  09/18/12 0818  Gross per 24 hour  Intake      0 ml  Output    5700 ml  Net  -5700 ml    Exam: Gen:  NAD Cardiovascular:  Tachycardic, No M/R/G Respiratory:  Lungs diminished Gastrointestinal:  Abdomen soft, NT/ND, + BS Extremities:  Bilateral amputee  Data Reviewed: Basic Metabolic Panel:  Recent Labs Lab 09/14/12 1526 09/15/12 0735 09/16/12 0640 09/17/12 1139 09/18/12 0355  NA 130* 122* 126* 129* 124*  K 3.1* 2.9* 3.6 3.7 3.7  CL 89* 83* 87* 93* 89*  CO2 33* 27 29 28 27   GLUCOSE 148* 142* 118* 128* 126*  BUN 6 4* 6 6 5*  CREATININE 0.54 0.51 0.50 0.43* 0.49*  CALCIUM 7.6* 7.0* 6.9* 7.5* 7.6*   GFR Estimated Creatinine Clearance: 69.7 ml/min (by C-G formula based on Cr of 0.49). Liver Function Tests:  Recent Labs Lab 09/14/12 0120 09/14/12 1526 09/15/12 0735  AST 27 30 45*  ALT 27 25 26   ALKPHOS 202* 218* 218*  BILITOT 1.2 1.1 1.0  PROT 6.9 6.9 5.7*  ALBUMIN 1.5* 1.6* 1.4*   Coagulation profile  Recent Labs Lab 09/16/12 1332  INR 1.16    CBC:  Recent Labs Lab 09/14/12 0120 09/15/12 0735 09/16/12 0640 09/18/12 0355  WBC 8.7 7.6 7.1 7.5  NEUTROABS 7.7  --   --   --   HGB 9.7* 8.4* 8.0* 8.8*  HCT 29.4* 25.1* 24.1* 27.5*  MCV 84.5 83.4 86.1 84.6  PLT 268 217 200 PLATELET CLUMPS NOTED ON SMEAR, COUNT APPEARS ADEQUATE   Cardiac Enzymes:  Recent Labs Lab 09/14/12 0120  CKTOTAL 114   BNP (last 3 results)  Recent Labs  11/09/11 1510 11/09/11 1858  PROBNP 393.9* 1229.0*   Microbiology Recent Results (from the past 240 hour(s))  URINE CULTURE     Status: None   Collection Time    09/14/12  2:04 AM      Result Value Range Status   Specimen Description URINE, CATHETERIZED   Final   Special Requests NONE   Final   Culture  Setup Time 09/14/2012 12:42   Final   Colony Count >=100,000 COLONIES/ML   Final   Culture     Final   Value: METHICILLIN RESISTANT STAPHYLOCOCCUS AUREUS     Note: RIFAMPIN AND GENTAMICIN SHOULD NOT BE USED AS SINGLE DRUGS FOR TREATMENT OF STAPH INFECTIONS. CRITICAL RESULT  CALLED TO, READ BACK BY AND VERIFIED WITH: SUELLEN 09/16/12 AT 1130 AM BY [facility]   Report Status 09/16/2012 FINAL   Final   Organism ID, Bacteria METHICILLIN RESISTANT STAPHYLOCOCCUS AUREUS   Final  URINE CULTURE     Status: None   Collection Time    09/14/12  6:14 AM      Result Value Range Status   Specimen Description URINE, CATHETERIZED   Final   Special Requests NONE   Final   Culture  Setup Time 09/14/2012 12:42   Final   Colony Count >=100,000 COLONIES/ML   Final   Culture     Final   Value: METHICILLIN RESISTANT STAPHYLOCOCCUS AUREUS     Note: RIFAMPIN AND GENTAMICIN SHOULD NOT BE USED AS SINGLE DRUGS FOR TREATMENT OF STAPH INFECTIONS. CRITICAL RESULT CALLED TO, READ BACK BY AND VERIFIED WITH: SUELLEN 09/16/12 AT 1130 AM BY [facility]   Report Status 09/16/2012 FINAL   Final   Organism ID, Bacteria METHICILLIN RESISTANT STAPHYLOCOCCUS AUREUS   Final  CULTURE, BLOOD (ROUTINE X 2)     Status: None  Collection Time    09/14/12  5:45 PM      Result Value Range Status   Specimen Description BLOOD LEFT HAND   Final   Special Requests BOTTLES DRAWN AEROBIC ONLY    Final   Culture  Setup Time 09/14/2012 21:00   Final   Culture     Final   Value: METHICILLIN RESISTANT STAPHYLOCOCCUS AUREUS     Note: RIFAMPIN AND GENTAMICIN SHOULD NOT BE USED AS SINGLE DRUGS FOR TREATMENT OF STAPH INFECTIONS.     23 Note: Gram Stain Report Called to,Read Back By and Verified With:  SUE ELLEN GROUNDS 2 14 249PM [facility] CRITICAL RESULT CALLED TO, READ BACK BY AND VERIFIED WITH: GROUNDS RN 3PM 09/16/12 GUSTK   Report Status 09/17/2012 FINAL   Final   Organism ID, Bacteria METHICILLIN RESISTANT STAPHYLOCOCCUS AUREUS   Final  CULTURE, BLOOD (ROUTINE X 2)     Status: None   Collection Time    09/14/12  5:45 PM      Result Value Range Status   Specimen Description BLOOD LEFT HAND   Final   Special Requests BOTTLES DRAWN AEROBIC ONLY    Final   Culture  Setup Time 09/14/2012 21:00   Final   Culture      Final   Value: STAPHYLOCOCCUS AUREUS     Note: SUSCEPTIBILITIES PERFORMED ON PREVIOUS CULTURE WITHIN THE LAST 5 DAYS.     23 Note: Gram Stain Report Called to,Read Back By and Verified With: SUE ELLEN GROUNDS 2 14 249PM [facility]   Report Status 09/17/2012 FINAL   Final  CULTURE, BLOOD (ROUTINE X 2)     Status: None   Collection Time    09/16/12 10:40 AM      Result Value Range Status   Specimen Description BLOOD RIGHT HAND   Final   Special Requests BOTTLES DRAWN AEROBIC ONLY 1CC   Final   Culture  Setup Time 09/16/2012 14:05   Final   Culture     Final   Value:        BLOOD CULTURE RECEIVED NO GROWTH TO DATE CULTURE WILL BE HELD FOR 5 DAYS BEFORE ISSUING A FINAL NEGATIVE REPORT   Report Status PENDING   Incomplete  CULTURE, BLOOD (ROUTINE X 2)     Status: None   Collection Time    09/16/12 10:45 AM      Result Value Range Status   Specimen Description BLOOD LEFT HAND   Final   Special Requests BOTTLES DRAWN AEROBIC ONLY 1CC   Final   Culture  Setup Time 09/16/2012 14:05   Final   Culture     Final   Value: GRAM POSITIVE COCCI IN CLUSTERS     Note: Gram Stain Report Called to,Read Back By and Verified With: CARRIE WARD ON 09/17/2012 AT 11:53P BY [facility]   Report Status PENDING   Incomplete     Procedures and Diagnostic Studies: Dg Chest 1 View  09/14/2012  *RADIOLOGY REPORT*  Clinical Data: Left-sided chest pain after fall.  CHEST - 1 VIEW  Comparison: 08/30/2012  Findings: Stable appearance of right-sided central venous catheter. Shallow inspiration with elevation right hemidiaphragm.  Possible fluid or thickened pleura in the right costophrenic angle.  Heart size and pulmonary vascularity are normal.  No focal consolidation or airspace disease.  No pneumothorax.  Old left rib fractures. Postoperative changes in the left shoulder.  Stable appearance since previous study.  IMPRESSION: No evidence of active pulmonary disease.   Original  Report Authenticated By: Burman Nieves, M.D.    Ct  Abdomen Pelvis W Contrast  09/14/2012  *RADIOLOGY REPORT*  Clinical Data: Left side abdominal pain after fall today.  History of splenic laceration.  CT ABDOMEN AND PELVIS WITH CONTRAST  Technique:  Multidetector CT imaging of the abdomen and pelvis was performed following the standard protocol during bolus administration of intravenous contrast.  Contrast: OMNIPAQUE IOHEXOL 300 MG/ML  SOLN  Comparison: 08/30/2012  Findings: Lung bases:  Mild apparent nodularity the anterior right lung base is new since the prior exam and therefore likely atelectasis.  There is increased right and similar left base dependent atelectasis.  No basilar pneumothorax.  Normal heart size with age advanced coronary artery atherosclerosis.  Calcified lower mediastinal nodes, as detailed on 08/25/2012.  Small hiatal hernia. Small pericardial effusion which is new.  Trace left pleural fluid thickening, improved.  Abdomen/pelvis:  Normal liver.  Improved appearance of the splenic laceration, with hypoattenuation identified on image 26/series 2. Resolved perisplenic hemorrhage.  Normal distal stomach.  Mild pancreatic atrophy. Cholecystectomy without biliary ductal dilatation.  Normal adrenal glands and right kidney.  An interpolar left renal cyst measures 1.8 cm.  There is also a too small to characterize lower pole left renal lesion which is not diagnostic of a simple cyst.  There is an IVC filter within the right IVC, terminating below the renal veins.  However, there is also a left IVC, which is patent and free of thrombus.  No retroperitoneal or retrocrural adenopathy.  Hartmann's pouch.  Descending colostomy. Normal terminal ileum. Normal small bowel without abdominal ascites.  Left external iliac node is borderline enlarged 1.0 cm and unchanged.  Urinary bladder collapsed and a Foley catheter.  Normal prostate. No significant free fluid.  Bones/Musculoskeletal:  Question skin irregularity/decubitus ulcer about the right ischial  region on image 100/series 2.  Osteopenia. Left proximal femoral fixation.  Left scapular tip is fractured on image 6/series 2.  This was similar on the 08/30/2012 exam.  There is also a fracture of the left tenth posterolateral rib on image 41/series 2.  This is likely new since the prior of 08/30/2012. Other left-sided rib fractures or remote.  IMPRESSION:  1.  A left tenth rib fracture which is likely acute since 08/30/2012. 2.  Improved splenic laceration with resolved perisplenic hematoma. 3.  No other acute post-traumatic deformity identified. 4.  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  The patient may benefit from a second filter in the left IVC. 5.  Subacute left-sided scapular tip fracture, incompletely imaged. 6.  New small pericardial effusion.   Original Report Authenticated By: Jeronimo Greaves, M.D.     Ct Shoulder Left Wo Contrast  09/18/2012  *RADIOLOGY REPORT*  Clinical Data: Left shoulder pain after a fall.  CT OF THE LEFT SHOULDER WITHOUT CONTRAST  Technique:  Multidetector CT imaging was performed according to the standard protocol. Multiplanar CT image reconstructions were also generated.  Comparison: Radiographs dated 09/14/2012  Findings: There is a slightly displaced fracture of the inferior to the tip of the scapula and the without evidence of healing. However, there are multiple adjacent bone of the left rib fractures and there is no appreciable soft tissue swelling at the site of the scapular fracture.  I suspect this is an old fracture.  Is the patient point tender in the medial inferior aspect of the scapula?  There is an old well healed fracture of the proximal humerus with secondary slight  deformity.  No acute abnormalities of proximal humerus are the humeral joint. The left clavicle is normal.  IMPRESSION: Probable old slightly displaced fracture of the inferior medial aspect of the tip of the scapula.  However, there is no bridging callus formation.  Is  the patient tender at that site?  There is no soft tissue swelling.   Original Report Authenticated By: Francene Boyers, M.D.    Ir Removal [employer] W/o [place] Mod Sed  09/16/2012  *RADIOLOGY REPORT*  Clinical data:  Septicemia.  TUNNELED PORT CATHETER REMOVAL:  Technique and findings: The procedure, risks (including but not limited to bleeding, infection, organ damage), benefits, and alternatives were explained to the patient.  Questions regarding the procedure were encouraged and answered.  The patient understands and consents to the procedure.  Intravenous Fentanyl and Versed were administered as conscious sedation during continuous cardiorespiratory monitoring by the radiology RN, with a total moderate sedation time of 24 minutes.  Overlying skin prepped with chlorhexidine, draped in usual sterile fashion, infiltrated locally with 1% lidocaine. A small incision was made over the   scar from previous placement. The port catheter was dissected free from the underlying soft tissues and removed intact. Hemostasis was achieved. The port pocket was closed with deep interrupted and subcuticular continuous 3-0 Monocryl sutures, then covered with Dermabond.  The patient tolerated the procedure well, without any complication.  IMPRESSION:  1. Technically successful tunneled Port catheter removal.   Original Report Authenticated By: D. Andria Rhein, MD    Transthoracic two-dimensional echocardiogram without contrast 09/16/2012   Study Conclusions - Left ventricle: The cavity size was normal. Wall thickness was normal. Systolic function was normal. The estimated ejection fraction was in the range of 50% to 55%. Although no diagnostic regional wall motion abnormality was identified, this possibility cannot be completely excluded on the basis of this study. - Ventricular septum: Septal motion showed paradox. Transthoracic echocardiography. M-mode, complete 2D, spectral Doppler, and color Doppler. Height: Height:  121.9cm. Height: 48in. Weight: Weight: 80.7kg. Weight: 177.6lb. Body mass index: BMI: 54.3kg/m^2. Body surface area: BSA: 1.46m^2. Blood pressure: 109/57. Patient status: Inpatient. Location: Bedside.     Dg Shoulder Left  09/14/2012  **ADDENDUM** CREATED: 09/14/2012 02:35:43  The focal irregularity of the tip of the scapula was present on previous CT of the chest dated 08/25/2012 and is therefore not a new finding since that time.  **END ADDENDUM** SIGNED BY: Marlon Pel, M.D.   09/14/2012  *RADIOLOGY REPORT*  Clinical Data: Left shoulder pain after fall.  LEFT SHOULDER - 2+ VIEW  Comparison: 08/25/2012  Findings: Postoperative changes in the left proximal humerus with plate and screw fixation across to old fracture deformity with callus formation.  Mild residual varus angulation.  No significant change since previous study.  Surgical clips in the axilla.  There is cortical irregularity at the distal aspect of the scapula.  This area was not well seen on the previous study but was not definitely present.  No acute scapular fractures not excluded.  Correlation with the location of the patient's pain is recommended.  The proximal humerus, glenohumeral joint, acromioclavicular joint, and coracoclavicular space appear intact.  Visualized ribs are not displaced.  No focal bone lesion or bone destruction.  IMPRESSION: Old postoperative and post-traumatic changes in the proximal humerus.  Possible fracture of the inferior aspect of the scapula.   Original Report Authenticated By: Burman Nieves, M.D.      Scheduled Meds: . sodium chloride   Intravenous Once  .  carisoprodol  350 mg Oral TID  . feeding supplement  237 mL Oral QID  . ferrous sulfate  325 mg Oral BID WC  . heparin subcutaneous  5,000 Units Subcutaneous Q8H  . micafungin (MYCAMINE) IV  100 mg Intravenous Daily  . OxyCODONE  80 mg Oral Q12H  . pantoprazole  40 mg Oral Q1200  . PHENobarbital  32.4 mg Oral Daily  . phenobarbital   64.8 mg Oral QHS  . vancomycin  1,000 mg Intravenous Q8H   Continuous Infusions: . sodium chloride 100 mL/hr at 09/18/12 0211    Time spent: 35 minutes.   LOS: 5 days   RAMA,CHRISTINA  [facility] Pager [phone].  If 8PM-8AM, please contact night-coverage at [email or web address], password [facility] 09/18/2012, 11:00 AM
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
## 2012-09-18 NOTE — Progress Notes (Signed)
Peripherally Inserted Central Catheter/Midline Placement  The IV Nurse has discussed with the patient and/or persons authorized to consent for the patient, the purpose of this procedure and the potential benefits and risks involved with this procedure.  The benefits include less needle sticks, lab draws from the catheter and patient may be discharged home with the catheter.  Risks include, but not limited to, infection, bleeding, blood clot (thrombus formation), and puncture of an artery; nerve damage and irregular heat beat.  Alternatives to this procedure were also discussed.  PICC/Midline Placement Documentation        Chris [name] 09/18/2012, 6:47 PM

## 2012-09-18 NOTE — Progress Notes (Signed)
CSW continuing to follow for SNF placement.  CSW sent updated clinicals to the facilities that are currently considering patient for SNF placement.  CSW to await responses from facilities and follow up with pt in re: to bed offers tomorrow.  CSW to continue to follow and facilitate pt discharge needs when pt medically stable for discharge.   Jacklynn Lewis, MSW, LCSWA  Clinical Social Work [phone]
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
## 2012-09-18 NOTE — Plan of Care (Signed)
Problem: Phase I Progression Outcomes Goal: Voiding-avoid urinary catheter unless indicated Outcome: Adequate for Discharge Pt has chronic foley catheter

## 2012-09-18 NOTE — Progress Notes (Signed)
Pt c/o of pain with IV infusion. Pt is receiving vancomyacin q 8 hours. IV was restarted early this AM. This RN spoke with IV team nurse who recommended a PICC. Texted Dr. Darnelle Catalan and received order for PICC line to to prolonged antibiotic use. IV team nurse came to room and attempted a PIV, with no success, will speak to nurse inserting PICC lines and PICC line will be inserted later this afternoon.

## 2012-09-18 NOTE — Progress Notes (Signed)
No acute fracture - needs PT/OT

## 2012-09-19 ENCOUNTER — Encounter (HOSPITAL_COMMUNITY): Payer: Self-pay | Admitting: *Deleted

## 2012-09-19 ENCOUNTER — Encounter (HOSPITAL_COMMUNITY)
Admission: EM | Disposition: A | Discharge status: Skilled Nursing Facility | Payer: Self-pay | Source: Home / Self Care | Attending: Internal Medicine

## 2012-09-19 DIAGNOSIS — I339 Acute and subacute endocarditis, unspecified: Secondary | ICD-10-CM

## 2012-09-19 DIAGNOSIS — A4902 Methicillin resistant Staphylococcus aureus infection, unspecified site: Secondary | ICD-10-CM

## 2012-09-19 DIAGNOSIS — R7881 Bacteremia: Secondary | ICD-10-CM

## 2012-09-19 HISTORY — PX: TEE WITHOUT CARDIOVERSION: SHX5443

## 2012-09-19 LAB — BASIC METABOLIC PANEL
CO2: 30 mEq/L (ref 19–32)
Chloride: 91 mEq/L — ABNORMAL LOW (ref 96–112)
Creatinine, Ser: 0.46 mg/dL — ABNORMAL LOW (ref 0.50–1.35)
GFR calc Af Amer: >90 mL/min (ref >90)
Potassium: 4.2 mEq/L (ref 3.5–5.1)
Sodium: 128 mEq/L — ABNORMAL LOW (ref 135–145)

## 2012-09-19 LAB — CULTURE, BLOOD (ROUTINE X 2)

## 2012-09-19 LAB — CBC
Platelets: 304 10*3/uL (ref 150–400)
RBC: 3.01 MIL/uL — ABNORMAL LOW (ref 4.22–5.81)
RDW: 17.5 % — ABNORMAL HIGH (ref 11.5–15.5)
WBC: 8.3 10*3/uL (ref 4.0–10.5)

## 2012-09-19 SURGERY — ECHOCARDIOGRAM, TRANSESOPHAGEAL
Anesthesia: Moderate Sedation

## 2012-09-19 MED ORDER — FENTANYL CITRATE 0.05 MG/ML IJ SOLN
INTRAMUSCULAR | Status: AC
  Filled 2012-09-19: qty 2

## 2012-09-19 MED ORDER — MIDAZOLAM HCL 5 MG/ML IJ SOLN
INTRAMUSCULAR | Status: AC
  Filled 2012-09-19: qty 2

## 2012-09-19 MED ORDER — FENTANYL CITRATE 0.05 MG/ML IJ SOLN
INTRAMUSCULAR | Status: DC | PRN
  Administered 2012-09-19 (×3): 25 ug via INTRAVENOUS

## 2012-09-19 MED ORDER — SODIUM CHLORIDE 0.9 % IV SOLN: INTRAVENOUS | Status: DC

## 2012-09-19 MED ORDER — DIPHENHYDRAMINE HCL 50 MG/ML IJ SOLN
INTRAMUSCULAR | Status: AC
  Filled 2012-09-19: qty 1

## 2012-09-19 MED ORDER — LORAZEPAM 2 MG/ML IJ SOLN
1.0000 mg | Freq: Once | INTRAMUSCULAR | Status: AC
  Administered 2012-09-19: 1 mg via INTRAVENOUS
  Filled 2012-09-19: qty 1

## 2012-09-19 MED ORDER — MIDAZOLAM HCL 10 MG/2ML IJ SOLN
INTRAMUSCULAR | Status: DC | PRN
  Administered 2012-09-19 (×2): 2 mg via INTRAVENOUS
  Administered 2012-09-19: 1 mg via INTRAVENOUS

## 2012-09-19 MED ORDER — BUTAMBEN-TETRACAINE-BENZOCAINE 2-2-14 % EX AERO
INHALATION_SPRAY | CUTANEOUS | Status: DC | PRN
  Administered 2012-09-19: 2 via TOPICAL
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
## 2012-09-19 NOTE — Progress Notes (Signed)
CSW attempted to follow up with pt re: SNF placement.  Pt was sleeping comfortably and RN reports that pt had just been medicated due to pain.  CSW will follow up with pt in the morning re: SNF placement.  Currently, [facility] has offered a bed and [facility] is considering which is closer to pt hometown.  Pt has expressed previously that he is agreeable to placement in either [facility] or [place].  CSW to continue to follow and facilitate pt discharge needs when pt medically stable for discharge.  Jacklynn Lewis, MSW, LCSWA  Clinical Social Work [phone]

## 2012-09-19 NOTE — Progress Notes (Signed)
Pt in procedure. Will recheck on pt for eval as schedule allows. Thanks, Lise Auer, OT

## 2012-09-19 NOTE — Interval H&P Note (Signed)
History and Physical Interval Note:  09/19/2012 10:34 AM  Chris [name] Sw[place]  has presented today for surgery, with the diagnosis of r/o endocarita  The various methods of treatment have been discussed with the patient and family. After consideration of risks, benefits and other options for treatment, the patient has consented to  Procedure(s) with comments: TRANSESOPHAGEAL ECHOCARDIOGRAM (TEE) (N/A) - trish will arrange transport for patient though [facility] as a surgical intervention .  The patient's history has been reviewed, patient examined, no change in status, stable for surgery.  I have reviewed the patient's chart and labs.  Questions were answered to the patient's satisfaction.     Daxten Kovalenko [employer]

## 2012-09-19 NOTE — Progress Notes (Signed)
[facility] PROGRESS NOTE  Chris [name] MRN:[id number] DOB: 09/23/1955 DOA: 09/13/2012 PCP: DONDIEGO,RICHARD M, MD  Brief narrative: Chris [name] is an 56 y.o. male with a past medical history of bilateral amputations, colon cancer status post colostomy, seizure disorder, chronic indwelling Foley catheter, pulmonary embolism status post IVC filter, recent hospitalization after a fall complicated by spleen laceration, VRE, B-cell lymphoma (Port-A-Cath placed in 2013, chemotherapy discontinued secondary to cardiac MI), who was admitted to the hospital on 09/14/2012 after another fall. Upon evaluation in the emergency department, the patient spiked a fever to 102. Urine and blood cultures were both positive for MRSA.  Assessment/Plan: Principal Problem:   Sepsis with complicated MRSA bacteremia and candidemia -ID consultation requested and performed by Dr. Cynthia Snider on 09/16/2012. Blood cultures x2 positive for MRSA 09/14/2012.  Repeat blood cultures 09/16/2012 show one of 2 positive for gram-positive cocci in clusters.  Prior blood cultures from 08/29/2012 grew Candida. -Indwelling Port-A-Cath removed by interventional radiology 09/16/2012. -Continue vancomycin for MRSA with a goal trough of 15-20. Active treatment: 4 weeks. -Continue micafungin for at least 2 weeks. -For transesophageal echocardiogram 09/19/2012 to rule out endocarditis.  Patient now claiming that he will refuse this procedure today. Encouraged to have it done, as scheduled. Active Problems:   Depression -Will ask for psychiatric evaluation.   Status post fall / left 10th rib fracture / left scapular fracture -CT scan of the abdomen and pelvis showed a left 10th rib fracture, improved splenic laceration with resolved perisplenic hematoma. No other acute post traumatic deformities identified. -Evaluated by Dr. Dean of orthopedics on 09/17/2012 with no surgical indication felt to be identified. -Physical therapy when able to  participate.   Stage IV decubitus ulcer left and right ischium / Deep tissue injury sacrum / Left stump partial thickness burn -Per [facility] RN recommendations. (Seen and evaluated on 09/17/12).   Encephalopathy acute -Resolved.   Seizure disorder -No evidence on phenobarbital.   Acute respiratory failure with hypoxia -Initially there were concerns for healthcare associated pneumonia. Received Unasyn and Levaquin for 2 days. No evidence of pneumonia and therefore ID recommended discontinuation of Unasyn and Levaquin. -Continue supplemental oxygen and bronchodilators as needed.   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   Hypokalemia -Monitor and replace potassium as needed.   S/P BKA (below knee amputation) bilateral -Physical therapy evaluations when able.   MRSA UTI (urinary tract infection) -Admission urinalysis showed too numerous to count white blood cells, so he was admitted and empirically placed on Unasyn. -Urine cultures ultimately grew MRSA.   Chronic pain -Continue current pain management with narcotics, soma.   Normocytic anemia -Likely AOCD.  No current indication for transfusion.  Code Status: DNR Family Communication: None at bedside. Disposition Plan: SNF.   Medical Consultants:  Dr. Cynthia Snider, ID  Dr. Gregory Scott Dean, Orthopedics  Dr. Hassell, IR  [facility]  Psychiatry  Anti-infectives:  Unasyn 09/14/2012---> 09/16/2012  Vancomycin 09/14/2012--->  Levaquin 09/14/2012---> 09/16/2012  Micafungin 09/16/2012--->  HPI/Subjective: Chris [name] is depressed this morning. He says he will refuse to go down to have the echocardiogram done because they "kept putting him off."  He stated that "it's getting late---it's almost afternoon", even though it is only 8:30 in the morning.  He also stated "you should just let me die ".  Objective: Filed Vitals:   09/18/12 0508 09/18/12 1400 09/18/12 2112 09/19/12 0500  BP: 107/57 92/56 105/54 98/65  Pulse: 95 63 88  103  Temp: 98.3 F (36.8 C) 97.9   F (36.6 C) 98 F (36.7 C) 99.5 F (37.5 C)  TempSrc: Oral Oral Oral Oral  Resp: 18 18 20 20  Height:      Weight:      SpO2: 97% 98% 97% 96%    Intake/Output Summary (Last 24 hours) at 09/19/12 0751 Last data filed at 09/19/12 0510  Gross per 24 hour  Intake 2856.66 ml  Output   5800 ml  Net -2943.34 ml    Exam: Gen:  NAD Cardiovascular:  Tachycardic, No M/R/G Respiratory:  Lungs diminished with course rhonchi bilaterally. Gastrointestinal:  Abdomen soft, NT/ND, + BS Extremities:  Bilateral amputee  Data Reviewed: Basic Metabolic Panel:  Recent Labs Lab 09/15/12 0735 09/16/12 0640 09/17/12 1139 09/18/12 0355 09/19/12 0455  NA 122* 126* 129* 124* 128*  K 2.9* 3.6 3.7 3.7 4.2  CL 83* 87* 93* 89* 91*  CO2 27 29 28 27 30  GLUCOSE 142* 118* 128* 126* 103*  BUN 4* 6 6 5* 6  CREATININE 0.51 0.50 0.43* 0.49* 0.46*  CALCIUM 7.0* 6.9* 7.5* 7.6* 7.5*   GFR Estimated Creatinine Clearance: 69.7 ml/min (by C-G formula based on Cr of 0.46). Liver Function Tests:  Recent Labs Lab 09/14/12 0120 09/14/12 1526 09/15/12 0735  AST 27 30 45*  ALT 27 25 26  ALKPHOS 202* 218* 218*  BILITOT 1.2 1.1 1.0  PROT 6.9 6.9 5.7*  ALBUMIN 1.5* 1.6* 1.4*   Coagulation profile  Recent Labs Lab 09/16/12 1332  INR 1.16    CBC:  Recent Labs Lab 09/14/12 0120 09/15/12 0735 09/16/12 0640 09/18/12 0355 09/19/12 0455  WBC 8.7 7.6 7.1 7.5 8.3  NEUTROABS 7.7  --   --   --   --   HGB 9.7* 8.4* 8.0* 8.8* 8.2*  HCT 29.4* 25.1* 24.1* 27.5* 25.8*  MCV 84.5 83.4 86.1 84.6 85.7  PLT 268 217 200 PLATELET CLUMPS NOTED ON SMEAR, COUNT APPEARS ADEQUATE 304   Cardiac Enzymes:  Recent Labs Lab 09/14/12 0120  CKTOTAL 114   BNP (last 3 results)  Recent Labs  11/09/11 1510 11/09/11 1858  PROBNP 393.9* 1229.0*   Microbiology Recent Results (from the past 240 hour(s))  URINE CULTURE     Status: None   Collection Time    09/14/12  2:04 AM       Result Value Range Status   Specimen Description URINE, CATHETERIZED   Final   Special Requests NONE   Final   Culture  Setup Time 09/14/2012 12:42   Final   Colony Count >=100,000 COLONIES/ML   Final   Culture     Final   Value: METHICILLIN RESISTANT STAPHYLOCOCCUS AUREUS     Note: RIFAMPIN AND GENTAMICIN SHOULD NOT BE USED AS SINGLE DRUGS FOR TREATMENT OF STAPH INFECTIONS. CRITICAL RESULT CALLED TO, READ BACK BY AND VERIFIED WITH: SUELLEN 09/16/12 AT 1130 AM BY MORAC   Report Status 09/16/2012 FINAL   Final   Organism ID, Bacteria METHICILLIN RESISTANT STAPHYLOCOCCUS AUREUS   Final  URINE CULTURE     Status: None   Collection Time    09/14/12  6:14 AM      Result Value Range Status   Specimen Description URINE, CATHETERIZED   Final   Special Requests NONE   Final   Culture  Setup Time 09/14/2012 12:42   Final   Colony Count >=100,000 COLONIES/ML   Final   Culture     Final   Value: METHICILLIN RESISTANT STAPHYLOCOCCUS AUREUS     Note: RIFAMPIN AND   GENTAMICIN SHOULD NOT BE USED AS SINGLE DRUGS FOR TREATMENT OF STAPH INFECTIONS. CRITICAL RESULT CALLED TO, READ BACK BY AND VERIFIED WITH: SUELLEN 09/16/12 AT 1130 AM BY MORAC   Report Status 09/16/2012 FINAL   Final   Organism ID, Bacteria METHICILLIN RESISTANT STAPHYLOCOCCUS AUREUS   Final  CULTURE, BLOOD (ROUTINE X 2)     Status: None   Collection Time    09/14/12  5:45 PM      Result Value Range Status   Specimen Description BLOOD LEFT HAND   Final   Special Requests BOTTLES DRAWN AEROBIC ONLY  1ML   Final   Culture  Setup Time 09/14/2012 21:00   Final   Culture     Final   Value: METHICILLIN RESISTANT STAPHYLOCOCCUS AUREUS     Note: RIFAMPIN AND GENTAMICIN SHOULD NOT BE USED AS SINGLE DRUGS FOR TREATMENT OF STAPH INFECTIONS.     23 Note: Gram Stain Report Called to,Read Back By and Verified With:  SUE ELLEN GROUNDS 2 14 249PM MITCV CRITICAL RESULT CALLED TO, READ BACK BY AND VERIFIED WITH: GROUNDS RN 3PM 09/16/12 GUSTK   Report  Status 09/17/2012 FINAL   Final   Organism ID, Bacteria METHICILLIN RESISTANT STAPHYLOCOCCUS AUREUS   Final  CULTURE, BLOOD (ROUTINE X 2)     Status: None   Collection Time    09/14/12  5:45 PM      Result Value Range Status   Specimen Description BLOOD LEFT HAND   Final   Special Requests BOTTLES DRAWN AEROBIC ONLY  3ML   Final   Culture  Setup Time 09/14/2012 21:00   Final   Culture     Final   Value: STAPHYLOCOCCUS AUREUS     Note: SUSCEPTIBILITIES PERFORMED ON PREVIOUS CULTURE WITHIN THE LAST 5 DAYS.     23 Note: Gram Stain Report Called to,Read Back By and Verified With: SUE ELLEN GROUNDS 2 14 249PM MITCV   Report Status 09/17/2012 FINAL   Final  CULTURE, BLOOD (ROUTINE X 2)     Status: None   Collection Time    09/16/12 10:40 AM      Result Value Range Status   Specimen Description BLOOD RIGHT HAND   Final   Special Requests BOTTLES DRAWN AEROBIC ONLY 1CC   Final   Culture  Setup Time 09/16/2012 14:05   Final   Culture     Final   Value:        BLOOD CULTURE RECEIVED NO GROWTH TO DATE CULTURE WILL BE HELD FOR 5 DAYS BEFORE ISSUING A FINAL NEGATIVE REPORT   Report Status PENDING   Incomplete  CULTURE, BLOOD (ROUTINE X 2)     Status: None   Collection Time    09/16/12 10:45 AM      Result Value Range Status   Specimen Description BLOOD LEFT HAND   Final   Special Requests BOTTLES DRAWN AEROBIC ONLY 1CC   Final   Culture  Setup Time 09/16/2012 14:05   Final   Culture     Final   Value: GRAM POSITIVE COCCI IN CLUSTERS     Note: Gram Stain Report Called to,Read Back By and Verified With: CARRIE WARD ON 09/17/2012 AT 11:53P BY WILEJ   Report Status PENDING   Incomplete     Procedures and Diagnostic Studies: Dg Chest 1 View  09/14/2012  *RADIOLOGY REPORT*  Clinical Data: Left-sided chest pain after fall.  CHEST - 1 VIEW  Comparison: 08/30/2012  Findings: Stable appearance of right-sided central   venous catheter. Shallow inspiration with elevation right hemidiaphragm.  Possible  fluid or thickened pleura in the right costophrenic angle.  Heart size and pulmonary vascularity are normal.  No focal consolidation or airspace disease.  No pneumothorax.  Old left rib fractures. Postoperative changes in the left shoulder.  Stable appearance since previous study.  IMPRESSION: No evidence of active pulmonary disease.   Original Report Authenticated By: William Stevens, M.D.    Ct Abdomen Pelvis W Contrast  09/14/2012  *RADIOLOGY REPORT*  Clinical Data: Left side abdominal pain after fall today.  History of splenic laceration.  CT ABDOMEN AND PELVIS WITH CONTRAST  Technique:  Multidetector CT imaging of the abdomen and pelvis was performed following the standard protocol during bolus administration of intravenous contrast.  Contrast: 100mL OMNIPAQUE IOHEXOL 300 MG/ML  SOLN  Comparison: 08/30/2012  Findings: Lung bases:  Mild apparent nodularity the anterior right lung base is new since the prior exam and therefore likely atelectasis.  There is increased right and similar left base dependent atelectasis.  No basilar pneumothorax.  Normal heart size with age advanced coronary artery atherosclerosis.  Calcified lower mediastinal nodes, as detailed on 08/25/2012.  Small hiatal hernia. Small pericardial effusion which is new.  Trace left pleural fluid thickening, improved.  Abdomen/pelvis:  Normal liver.  Improved appearance of the splenic laceration, with hypoattenuation identified on image 26/series 2. Resolved perisplenic hemorrhage.  Normal distal stomach.  Mild pancreatic atrophy. Cholecystectomy without biliary ductal dilatation.  Normal adrenal glands and right kidney.  An interpolar left renal cyst measures 1.8 cm.  There is also a too small to characterize lower pole left renal lesion which is not diagnostic of a simple cyst.  There is an IVC filter within the right IVC, terminating below the renal veins.  However, there is also a left IVC, which is patent and free of thrombus.  No  retroperitoneal or retrocrural adenopathy.  Hartmann's pouch.  Descending colostomy. Normal terminal ileum. Normal small bowel without abdominal ascites.  Left external iliac node is borderline enlarged 1.0 cm and unchanged.  Urinary bladder collapsed and a Foley catheter.  Normal prostate. No significant free fluid.  Bones/Musculoskeletal:  Question skin irregularity/decubitus ulcer about the right ischial region on image 100/series 2.  Osteopenia. Left proximal femoral fixation.  Left scapular tip is fractured on image 6/series 2.  This was similar on the 08/30/2012 exam.  There is also a fracture of the left tenth posterolateral rib on image 41/series 2.  This is likely new since the prior of 08/30/2012. Other left-sided rib fractures or remote.  IMPRESSION:  1.  A left tenth rib fracture which is likely acute since 08/30/2012. 2.  Improved splenic laceration with resolved perisplenic hematoma. 3.  No other acute post-traumatic deformity identified. 4.  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  The patient may benefit from a second filter in the left IVC. 5.  Subacute left-sided scapular tip fracture, incompletely imaged. 6.  New small pericardial effusion.   Original Report Authenticated By: Kyle Talbot, M.D.     Ct Shoulder Left Wo Contrast  09/18/2012  *RADIOLOGY REPORT*  Clinical Data: Left shoulder pain after a fall.  CT OF THE LEFT SHOULDER WITHOUT CONTRAST  Technique:  Multidetector CT imaging was performed according to the standard protocol. Multiplanar CT image reconstructions were also generated.  Comparison: Radiographs dated 09/14/2012  Findings: There is a slightly displaced fracture of the inferior to the tip of the scapula and the without evidence   of healing. However, there are multiple adjacent bone of the left rib fractures and there is no appreciable soft tissue swelling at the site of the scapular fracture.  I suspect this is an old fracture.  Is the patient  point tender in the medial inferior aspect of the scapula?  There is an old well healed fracture of the proximal humerus with secondary slight deformity.  No acute abnormalities of proximal humerus are the humeral joint. The left clavicle is normal.  IMPRESSION: Probable old slightly displaced fracture of the inferior medial aspect of the tip of the scapula.  However, there is no bridging callus formation.  Is the patient tender at that site?  There is no soft tissue swelling.   Original Report Authenticated By: James Maxwell, M.D.    Ir Removal Tun Access W/ Port W/o Fl Mod Sed  09/16/2012  *RADIOLOGY REPORT*  Clinical data:  Septicemia.  TUNNELED PORT CATHETER REMOVAL:  Technique and findings: The procedure, risks (including but not limited to bleeding, infection, organ damage), benefits, and alternatives were explained to the patient.  Questions regarding the procedure were encouraged and answered.  The patient understands and consents to the procedure.  Intravenous Fentanyl and Versed were administered as conscious sedation during continuous cardiorespiratory monitoring by the radiology RN, with a total moderate sedation time of 24 minutes.  Overlying skin prepped with chlorhexidine, draped in usual sterile fashion, infiltrated locally with 1% lidocaine. A small incision was made over the   scar from previous placement. The port catheter was dissected free from the underlying soft tissues and removed intact. Hemostasis was achieved. The port pocket was closed with deep interrupted and subcuticular continuous 3-0 Monocryl sutures, then covered with Dermabond.  The patient tolerated the procedure well, without any complication.  IMPRESSION:  1. Technically successful tunneled Port catheter removal.   Original Report Authenticated By: D. Hassell III, MD    Transthoracic two-dimensional echocardiogram without contrast 09/16/2012   Study Conclusions - Left ventricle: The cavity size was normal. Wall thickness  was normal. Systolic function was normal. The estimated ejection fraction was in the range of 50% to 55%. Although no diagnostic regional wall motion abnormality was identified, this possibility cannot be completely excluded on the basis of this study. - Ventricular septum: Septal motion showed paradox. Transthoracic echocardiography. M-mode, complete 2D, spectral Doppler, and color Doppler. Height: Height: 121.9cm. Height: 48in. Weight: Weight: 80.7kg. Weight: 177.6lb. Body mass index: BMI: 54.3kg/m^2. Body surface area: BSA: 1.51m^2. Blood pressure: 109/57. Patient status: Inpatient. Location: Bedside.     Dg Shoulder Left  09/14/2012  **ADDENDUM** CREATED: 09/14/2012 02:35:43  The focal irregularity of the tip of the scapula was present on previous CT of the chest dated 08/25/2012 and is therefore not a new finding since that time.  **END ADDENDUM** SIGNED BY: William R. Stevens, M.D.   09/14/2012  *RADIOLOGY REPORT*  Clinical Data: Left shoulder pain after fall.  LEFT SHOULDER - 2+ VIEW  Comparison: 08/25/2012  Findings: Postoperative changes in the left proximal humerus with plate and screw fixation across to old fracture deformity with callus formation.  Mild residual varus angulation.  No significant change since previous study.  Surgical clips in the axilla.  There is cortical irregularity at the distal aspect of the scapula.  This area was not well seen on the previous study but was not definitely present.  No acute scapular fractures not excluded.  Correlation with the location of the patient's pain is recommended.  The proximal humerus, glenohumeral joint,   acromioclavicular joint, and coracoclavicular space appear intact.  Visualized ribs are not displaced.  No focal bone lesion or bone destruction.  IMPRESSION: Old postoperative and post-traumatic changes in the proximal humerus.  Possible fracture of the inferior aspect of the scapula.   Original Report Authenticated By: William Stevens,  M.D.      Scheduled Meds: . sodium chloride   Intravenous Once  . carisoprodol  350 mg Oral TID  . feeding supplement  237 mL Oral QID  . ferrous sulfate  325 mg Oral BID WC  . heparin subcutaneous  5,000 Units Subcutaneous Q8H  . micafungin (MYCAMINE) IV  100 mg Intravenous Daily  . OxyCODONE  80 mg Oral Q12H  . pantoprazole  40 mg Oral Q1200  . PHENobarbital  32.4 mg Oral Daily  . phenobarbital  64.8 mg Oral QHS  . sodium chloride  10-40 mL Intracatheter Q12H  . vancomycin  1,250 mg Intravenous Q8H   Continuous Infusions: . sodium chloride 100 mL/hr at 09/19/12 0616    Time spent: 35 minutes.   LOS: 6 days   Hanan Mcwilliams  [facility] Pager [phone].  If 8PM-8AM, please contact night-coverage at [email or web address], password [email or web address] 09/19/2012, 7:51 AM                

## 2012-09-19 NOTE — H&P (View-Only) (Signed)
[facility] PROGRESS NOTE  Chris [name] ZOX:[id number] DOB: June 04, 1956 DOA: 09/13/2012 PCP: [name], MD  Brief narrative: Chris [name] is an 57 y.o. male with a past medical history of bilateral amputations, colon cancer status post colostomy, seizure disorder, chronic indwelling Foley catheter, pulmonary embolism status post IVC filter, recent hospitalization after a fall complicated by spleen laceration, VRE, B-cell lymphoma (Port-A-Cath placed in 2013, chemotherapy discontinued secondary to cardiac MI), who was admitted to the hospital on 09/14/2012 after another fall. Upon evaluation in the emergency department, the patient spiked a fever to 102. Urine and blood cultures were both positive for MRSA.  Assessment/Plan: Principal Problem:   Sepsis with complicated MRSA bacteremia and candidemia -ID consultation requested and performed by Dr. Judyann Munson on 09/16/2012. Blood cultures x2 positive for MRSA 09/14/2012.  Repeat blood cultures 09/16/2012 show one of 2 positive for gram-positive cocci in clusters.  Prior blood cultures from 08/29/2012 grew Candida. -Indwelling Port-A-Cath removed by interventional radiology 09/16/2012. -Continue vancomycin for MRSA with a goal trough of 15-20. Active treatment: 4 weeks. -Continue micafungin for at least 2 weeks. -For transesophageal echocardiogram 09/19/2012 to rule out endocarditis.  Patient now claiming that he will refuse this procedure today. Encouraged to have it done, as scheduled. Active Problems:   Depression -Will ask for psychiatric evaluation.   Status post fall / left 10th rib fracture / left scapular fracture -CT scan of the abdomen and pelvis showed a left 10th rib fracture, improved splenic laceration with resolved perisplenic hematoma. No other acute post traumatic deformities identified. -Evaluated by Dr. August Saucer of orthopedics on 09/17/2012 with no surgical indication felt to be identified. -Physical therapy when able to  participate.   Stage IV decubitus ulcer left and right ischium / Deep tissue injury sacrum / Left stump partial thickness burn -Per [facility] RN recommendations. (Seen and evaluated on 09/17/12).   Encephalopathy acute -Resolved.   Seizure disorder -No evidence on phenobarbital.   Acute respiratory failure with hypoxia -Initially there were concerns for healthcare associated pneumonia. Received Unasyn and Levaquin for 2 days. No evidence of pneumonia and therefore ID recommended discontinuation of Unasyn and Levaquin. -Continue supplemental oxygen and bronchodilators as needed.   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   Hypokalemia -Monitor and replace potassium as needed.   S/P BKA (below knee amputation) bilateral -Physical therapy evaluations when able.   MRSA UTI (urinary tract infection) -Admission urinalysis showed too numerous to count white blood cells, so he was admitted and empirically placed on Unasyn. -Urine cultures ultimately grew MRSA.   Chronic pain -Continue current pain management with narcotics, soma.   Normocytic anemia -Likely AOCD.  No current indication for transfusion.  Code Status: DNR Family Communication: None at bedside. Disposition Plan: SNF.   Medical Consultants:  Dr. Judyann Munson, ID  Dr. Cammy Copa, Orthopedics  Dr. Archie Patten  [facility]  Psychiatry  Anti-infectives:  Unasyn 09/14/2012---> 09/16/2012  Vancomycin 09/14/2012--->  Levaquin 09/14/2012---> 09/16/2012  Micafungin 09/16/2012--->  HPI/Subjective: Chris [name] is depressed this morning. He says he will refuse to go down to have the echocardiogram done because they "kept putting him off."  He stated that "it's getting late---it's almost afternoon", even though it is only 8:30 in the morning.  He also stated "you should just let me die ".  Objective: Filed Vitals:   09/18/12 0508 09/18/12 1400 09/18/12 2112 09/19/12 0500  BP: 107/57 92/56 105/54 98/65  Pulse: 95 63 88  103  Temp: 98.3 F (36.8 C) 97.9  F (36.6 C) 98 F (36.7 C) 99.5 F (37.5 C)  TempSrc: Oral Oral Oral Oral  Resp: 18 18 20 20   Height:      Weight:      SpO2: 97% 98% 97% 96%    Intake/Output Summary (Last 24 hours) at 09/19/12 0751 Last data filed at 09/19/12 0510  Gross per 24 hour  Intake 2856.66 ml  Output   5800 ml  Net -2943.34 ml    Exam: Gen:  NAD Cardiovascular:  Tachycardic, No M/R/G Respiratory:  Lungs diminished with course rhonchi bilaterally. Gastrointestinal:  Abdomen soft, NT/ND, + BS Extremities:  Bilateral amputee  Data Reviewed: Basic Metabolic Panel:  Recent Labs Lab 09/15/12 0735 09/16/12 0640 09/17/12 1139 09/18/12 0355 09/19/12 0455  NA 122* 126* 129* 124* 128*  K 2.9* 3.6 3.7 3.7 4.2  CL 83* 87* 93* 89* 91*  CO2 27 29 28 27 30   GLUCOSE 142* 118* 128* 126* 103*  BUN 4* 6 6 5* 6  CREATININE 0.51 0.50 0.43* 0.49* 0.46*  CALCIUM 7.0* 6.9* 7.5* 7.6* 7.5*   GFR Estimated Creatinine Clearance: 69.7 ml/min (by C-G formula based on Cr of 0.46). Liver Function Tests:  Recent Labs Lab 09/14/12 0120 09/14/12 1526 09/15/12 0735  AST 27 30 45*  ALT 27 25 26   ALKPHOS 202* 218* 218*  BILITOT 1.2 1.1 1.0  PROT 6.9 6.9 5.7*  ALBUMIN 1.5* 1.6* 1.4*   Coagulation profile  Recent Labs Lab 09/16/12 1332  INR 1.16    CBC:  Recent Labs Lab 09/14/12 0120 09/15/12 0735 09/16/12 0640 09/18/12 0355 09/19/12 0455  WBC 8.7 7.6 7.1 7.5 8.3  NEUTROABS 7.7  --   --   --   --   HGB 9.7* 8.4* 8.0* 8.8* 8.2*  HCT 29.4* 25.1* 24.1* 27.5* 25.8*  MCV 84.5 83.4 86.1 84.6 85.7  PLT 268 217 200 PLATELET CLUMPS NOTED ON SMEAR, COUNT APPEARS ADEQUATE 304   Cardiac Enzymes:  Recent Labs Lab 09/14/12 0120  CKTOTAL 114   BNP (last 3 results)  Recent Labs  11/09/11 1510 11/09/11 1858  PROBNP 393.9* 1229.0*   Microbiology Recent Results (from the past 240 hour(s))  URINE CULTURE     Status: None   Collection Time    09/14/12  2:04 AM       Result Value Range Status   Specimen Description URINE, CATHETERIZED   Final   Special Requests NONE   Final   Culture  Setup Time 09/14/2012 12:42   Final   Colony Count >=100,000 COLONIES/ML   Final   Culture     Final   Value: METHICILLIN RESISTANT STAPHYLOCOCCUS AUREUS     Note: RIFAMPIN AND GENTAMICIN SHOULD NOT BE USED AS SINGLE DRUGS FOR TREATMENT OF STAPH INFECTIONS. CRITICAL RESULT CALLED TO, READ BACK BY AND VERIFIED WITH: SUELLEN 09/16/12 AT 1130 AM BY [facility]   Report Status 09/16/2012 FINAL   Final   Organism ID, Bacteria METHICILLIN RESISTANT STAPHYLOCOCCUS AUREUS   Final  URINE CULTURE     Status: None   Collection Time    09/14/12  6:14 AM      Result Value Range Status   Specimen Description URINE, CATHETERIZED   Final   Special Requests NONE   Final   Culture  Setup Time 09/14/2012 12:42   Final   Colony Count >=100,000 COLONIES/ML   Final   Culture     Final   Value: METHICILLIN RESISTANT STAPHYLOCOCCUS AUREUS     Note: RIFAMPIN AND  GENTAMICIN SHOULD NOT BE USED AS SINGLE DRUGS FOR TREATMENT OF STAPH INFECTIONS. CRITICAL RESULT CALLED TO, READ BACK BY AND VERIFIED WITH: SUELLEN 09/16/12 AT 1130 AM BY [facility]   Report Status 09/16/2012 FINAL   Final   Organism ID, Bacteria METHICILLIN RESISTANT STAPHYLOCOCCUS AUREUS   Final  CULTURE, BLOOD (ROUTINE X 2)     Status: None   Collection Time    09/14/12  5:45 PM      Result Value Range Status   Specimen Description BLOOD LEFT HAND   Final   Special Requests BOTTLES DRAWN AEROBIC ONLY    Final   Culture  Setup Time 09/14/2012 21:00   Final   Culture     Final   Value: METHICILLIN RESISTANT STAPHYLOCOCCUS AUREUS     Note: RIFAMPIN AND GENTAMICIN SHOULD NOT BE USED AS SINGLE DRUGS FOR TREATMENT OF STAPH INFECTIONS.     23 Note: Gram Stain Report Called to,Read Back By and Verified With:  SUE ELLEN GROUNDS 2 14 249PM [facility] CRITICAL RESULT CALLED TO, READ BACK BY AND VERIFIED WITH: GROUNDS RN 3PM 09/16/12 GUSTK   Report  Status 09/17/2012 FINAL   Final   Organism ID, Bacteria METHICILLIN RESISTANT STAPHYLOCOCCUS AUREUS   Final  CULTURE, BLOOD (ROUTINE X 2)     Status: None   Collection Time    09/14/12  5:45 PM      Result Value Range Status   Specimen Description BLOOD LEFT HAND   Final   Special Requests BOTTLES DRAWN AEROBIC ONLY    Final   Culture  Setup Time 09/14/2012 21:00   Final   Culture     Final   Value: STAPHYLOCOCCUS AUREUS     Note: SUSCEPTIBILITIES PERFORMED ON PREVIOUS CULTURE WITHIN THE LAST 5 DAYS.     23 Note: Gram Stain Report Called to,Read Back By and Verified With: SUE ELLEN GROUNDS 2 14 249PM [facility]   Report Status 09/17/2012 FINAL   Final  CULTURE, BLOOD (ROUTINE X 2)     Status: None   Collection Time    09/16/12 10:40 AM      Result Value Range Status   Specimen Description BLOOD RIGHT HAND   Final   Special Requests BOTTLES DRAWN AEROBIC ONLY 1CC   Final   Culture  Setup Time 09/16/2012 14:05   Final   Culture     Final   Value:        BLOOD CULTURE RECEIVED NO GROWTH TO DATE CULTURE WILL BE HELD FOR 5 DAYS BEFORE ISSUING A FINAL NEGATIVE REPORT   Report Status PENDING   Incomplete  CULTURE, BLOOD (ROUTINE X 2)     Status: None   Collection Time    09/16/12 10:45 AM      Result Value Range Status   Specimen Description BLOOD LEFT HAND   Final   Special Requests BOTTLES DRAWN AEROBIC ONLY 1CC   Final   Culture  Setup Time 09/16/2012 14:05   Final   Culture     Final   Value: GRAM POSITIVE COCCI IN CLUSTERS     Note: Gram Stain Report Called to,Read Back By and Verified With: CARRIE WARD ON 09/17/2012 AT 11:53P BY [facility]   Report Status PENDING   Incomplete     Procedures and Diagnostic Studies: Dg Chest 1 View  09/14/2012  *RADIOLOGY REPORT*  Clinical Data: Left-sided chest pain after fall.  CHEST - 1 VIEW  Comparison: 08/30/2012  Findings: Stable appearance of right-sided central  venous catheter. Shallow inspiration with elevation right hemidiaphragm.  Possible  fluid or thickened pleura in the right costophrenic angle.  Heart size and pulmonary vascularity are normal.  No focal consolidation or airspace disease.  No pneumothorax.  Old left rib fractures. Postoperative changes in the left shoulder.  Stable appearance since previous study.  IMPRESSION: No evidence of active pulmonary disease.   Original Report Authenticated By: Burman Nieves, M.D.    Ct Abdomen Pelvis W Contrast  09/14/2012  *RADIOLOGY REPORT*  Clinical Data: Left side abdominal pain after fall today.  History of splenic laceration.  CT ABDOMEN AND PELVIS WITH CONTRAST  Technique:  Multidetector CT imaging of the abdomen and pelvis was performed following the standard protocol during bolus administration of intravenous contrast.  Contrast: OMNIPAQUE IOHEXOL 300 MG/ML  SOLN  Comparison: 08/30/2012  Findings: Lung bases:  Mild apparent nodularity the anterior right lung base is new since the prior exam and therefore likely atelectasis.  There is increased right and similar left base dependent atelectasis.  No basilar pneumothorax.  Normal heart size with age advanced coronary artery atherosclerosis.  Calcified lower mediastinal nodes, as detailed on 08/25/2012.  Small hiatal hernia. Small pericardial effusion which is new.  Trace left pleural fluid thickening, improved.  Abdomen/pelvis:  Normal liver.  Improved appearance of the splenic laceration, with hypoattenuation identified on image 26/series 2. Resolved perisplenic hemorrhage.  Normal distal stomach.  Mild pancreatic atrophy. Cholecystectomy without biliary ductal dilatation.  Normal adrenal glands and right kidney.  An interpolar left renal cyst measures 1.8 cm.  There is also a too small to characterize lower pole left renal lesion which is not diagnostic of a simple cyst.  There is an IVC filter within the right IVC, terminating below the renal veins.  However, there is also a left IVC, which is patent and free of thrombus.  No  retroperitoneal or retrocrural adenopathy.  Hartmann's pouch.  Descending colostomy. Normal terminal ileum. Normal small bowel without abdominal ascites.  Left external iliac node is borderline enlarged 1.0 cm and unchanged.  Urinary bladder collapsed and a Foley catheter.  Normal prostate. No significant free fluid.  Bones/Musculoskeletal:  Question skin irregularity/decubitus ulcer about the right ischial region on image 100/series 2.  Osteopenia. Left proximal femoral fixation.  Left scapular tip is fractured on image 6/series 2.  This was similar on the 08/30/2012 exam.  There is also a fracture of the left tenth posterolateral rib on image 41/series 2.  This is likely new since the prior of 08/30/2012. Other left-sided rib fractures or remote.  IMPRESSION:  1.  A left tenth rib fracture which is likely acute since 08/30/2012. 2.  Improved splenic laceration with resolved perisplenic hematoma. 3.  No other acute post-traumatic deformity identified. 4.  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  The patient may benefit from a second filter in the left IVC. 5.  Subacute left-sided scapular tip fracture, incompletely imaged. 6.  New small pericardial effusion.   Original Report Authenticated By: Jeronimo Greaves, M.D.     Ct Shoulder Left Wo Contrast  09/18/2012  *RADIOLOGY REPORT*  Clinical Data: Left shoulder pain after a fall.  CT OF THE LEFT SHOULDER WITHOUT CONTRAST  Technique:  Multidetector CT imaging was performed according to the standard protocol. Multiplanar CT image reconstructions were also generated.  Comparison: Radiographs dated 09/14/2012  Findings: There is a slightly displaced fracture of the inferior to the tip of the scapula and the without evidence  of healing. However, there are multiple adjacent bone of the left rib fractures and there is no appreciable soft tissue swelling at the site of the scapular fracture.  I suspect this is an old fracture.  Is the patient  point tender in the medial inferior aspect of the scapula?  There is an old well healed fracture of the proximal humerus with secondary slight deformity.  No acute abnormalities of proximal humerus are the humeral joint. The left clavicle is normal.  IMPRESSION: Probable old slightly displaced fracture of the inferior medial aspect of the tip of the scapula.  However, there is no bridging callus formation.  Is the patient tender at that site?  There is no soft tissue swelling.   Original Report Authenticated By: Francene Boyers, M.D.    Ir Removal [employer] W/o [place] Mod Sed  09/16/2012  *RADIOLOGY REPORT*  Clinical data:  Septicemia.  TUNNELED PORT CATHETER REMOVAL:  Technique and findings: The procedure, risks (including but not limited to bleeding, infection, organ damage), benefits, and alternatives were explained to the patient.  Questions regarding the procedure were encouraged and answered.  The patient understands and consents to the procedure.  Intravenous Fentanyl and Versed were administered as conscious sedation during continuous cardiorespiratory monitoring by the radiology RN, with a total moderate sedation time of 24 minutes.  Overlying skin prepped with chlorhexidine, draped in usual sterile fashion, infiltrated locally with 1% lidocaine. A small incision was made over the   scar from previous placement. The port catheter was dissected free from the underlying soft tissues and removed intact. Hemostasis was achieved. The port pocket was closed with deep interrupted and subcuticular continuous 3-0 Monocryl sutures, then covered with Dermabond.  The patient tolerated the procedure well, without any complication.  IMPRESSION:  1. Technically successful tunneled Port catheter removal.   Original Report Authenticated By: D. Andria Rhein, MD    Transthoracic two-dimensional echocardiogram without contrast 09/16/2012   Study Conclusions - Left ventricle: The cavity size was normal. Wall thickness  was normal. Systolic function was normal. The estimated ejection fraction was in the range of 50% to 55%. Although no diagnostic regional wall motion abnormality was identified, this possibility cannot be completely excluded on the basis of this study. - Ventricular septum: Septal motion showed paradox. Transthoracic echocardiography. M-mode, complete 2D, spectral Doppler, and color Doppler. Height: Height: 121.9cm. Height: 48in. Weight: Weight: 80.7kg. Weight: 177.6lb. Body mass index: BMI: 54.3kg/m^2. Body surface area: BSA: 1.30m^2. Blood pressure: 109/57. Patient status: Inpatient. Location: Bedside.     Dg Shoulder Left  09/14/2012  **ADDENDUM** CREATED: 09/14/2012 02:35:43  The focal irregularity of the tip of the scapula was present on previous CT of the chest dated 08/25/2012 and is therefore not a new finding since that time.  **END ADDENDUM** SIGNED BY: Marlon Pel, M.D.   09/14/2012  *RADIOLOGY REPORT*  Clinical Data: Left shoulder pain after fall.  LEFT SHOULDER - 2+ VIEW  Comparison: 08/25/2012  Findings: Postoperative changes in the left proximal humerus with plate and screw fixation across to old fracture deformity with callus formation.  Mild residual varus angulation.  No significant change since previous study.  Surgical clips in the axilla.  There is cortical irregularity at the distal aspect of the scapula.  This area was not well seen on the previous study but was not definitely present.  No acute scapular fractures not excluded.  Correlation with the location of the patient's pain is recommended.  The proximal humerus, glenohumeral joint,  acromioclavicular joint, and coracoclavicular space appear intact.  Visualized ribs are not displaced.  No focal bone lesion or bone destruction.  IMPRESSION: Old postoperative and post-traumatic changes in the proximal humerus.  Possible fracture of the inferior aspect of the scapula.   Original Report Authenticated By: Burman Nieves,  M.D.      Scheduled Meds: . sodium chloride   Intravenous Once  . carisoprodol  350 mg Oral TID  . feeding supplement  237 mL Oral QID  . ferrous sulfate  325 mg Oral BID WC  . heparin subcutaneous  5,000 Units Subcutaneous Q8H  . micafungin (MYCAMINE) IV  100 mg Intravenous Daily  . OxyCODONE  80 mg Oral Q12H  . pantoprazole  40 mg Oral Q1200  . PHENobarbital  32.4 mg Oral Daily  . phenobarbital  64.8 mg Oral QHS  . sodium chloride  10-40 mL Intracatheter Q12H  . vancomycin  1,250 mg Intravenous Q8H   Continuous Infusions: . sodium chloride 100 mL/hr at 09/19/12 0616    Time spent: 35 minutes.   LOS: 6 days   Amerika Nourse  [facility] Pager [phone].  If 8PM-8AM, please contact night-coverage at [email or web address], password [email or web address] 09/19/2012, 7:51 AM

## 2012-09-19 NOTE — CV Procedure (Signed)
Procedure: TEE  Indication: Endocarditis  Sedation: Versed 5 mg IV, Fentanyl 75 mcg IV  Findings: See echo section of chart for full report.  Valves all viewed, no significant abnromalities.  No evidence for endocarditis.  LV EF 50% with septal bounce.   Chris [name] 09/19/2012 11:15 AM

## 2012-09-19 NOTE — Progress Notes (Signed)
Echocardiogram Echocardiogram Transesophageal has been performed.  Chris [name] 09/19/2012, 1:22 PM

## 2012-09-20 ENCOUNTER — Encounter (HOSPITAL_COMMUNITY): Payer: Self-pay | Admitting: Cardiology

## 2012-09-20 DIAGNOSIS — F4310 Post-traumatic stress disorder, unspecified: Secondary | ICD-10-CM

## 2012-09-20 LAB — BASIC METABOLIC PANEL
CO2: 31 mEq/L (ref 19–32)
Calcium: 7.5 mg/dL — ABNORMAL LOW (ref 8.4–10.5)
Creatinine, Ser: 0.43 mg/dL — ABNORMAL LOW (ref 0.50–1.35)
GFR calc Af Amer: >90 mL/min (ref >90)
GFR calc non Af Amer: >90 mL/min (ref >90)
Sodium: 129 mEq/L — ABNORMAL LOW (ref 135–145)

## 2012-09-20 LAB — CBC
MCV: 86.3 fL (ref 78.0–100.0)
Platelets: 295 10*3/uL (ref 150–400)
RBC: 2.77 MIL/uL — ABNORMAL LOW (ref 4.22–5.81)
RDW: 18 % — ABNORMAL HIGH (ref 11.5–15.5)
WBC: 6.9 10*3/uL (ref 4.0–10.5)

## 2012-09-20 MED ORDER — OXYCODONE HCL ER 40 MG PO T12A
100.0000 mg | EXTENDED_RELEASE_TABLET | Freq: Two times a day (BID) | ORAL | Status: DC
  Administered 2012-09-20 – 2012-09-21 (×2): 100 mg via ORAL
  Filled 2012-09-20 (×3): qty 2

## 2012-09-20 MED ORDER — DULOXETINE HCL 30 MG PO CPEP
30.0000 mg | ORAL_CAPSULE | Freq: Every day | ORAL | Status: DC
  Administered 2012-09-20 – 2012-09-21 (×2): 30 mg via ORAL
  Filled 2012-09-20 (×3): qty 1

## 2012-09-20 NOTE — Progress Notes (Addendum)
Clinical Social Work Department CLINICAL SOCIAL WORK PLACEMENT NOTE 09/20/2012  Patient:  Chris [name], Chris [name]  Account Number:  [id number] Admit date:  09/13/2012  Clinical Social Worker:  Jacelyn Grip  Date/time:  09/20/2012 05:00 PM  Clinical Social Work is seeking post-discharge placement for this patient at the following level of care:   SKILLED NURSING   (*CSW will update this form in Epic as items are completed)   09/15/2012  Patient/family provided with [facility] Department of Clinical Social Work's list of facilities offering this level of care within the geographic area requested by the patient (or if unable, by the patient's family).  09/15/2012  Patient/family informed of their freedom to choose among providers that offer the needed level of care, that participate in Medicare, Medicaid or managed care program needed by the patient, have an available bed and are willing to accept the patient.  09/15/2012  Patient/family informed of [facility]' ownership interest in [facility], as well as of the fact that they are under no obligation to receive care at this facility.  PASARR submitted to EDS on 09/15/2012 PASARR number received from EDS on 09/15/2012  FL2 transmitted to all facilities in geographic area requested by pt/family on  09/15/2012 FL2 transmitted to all facilities within larger geographic area on   Patient informed that his/her managed care company has contracts with or will negotiate with  certain facilities, including the following:     Patient/family informed of bed offers received:  09/20/2012 Patient chooses bed at [facility] Physician recommends and patient chooses bed at    Patient to be transferred to [facility] on  09/21/12 Patient to be transferred to facility by [facility]  The following physician request were entered in Epic:   Additional Comments:  Jacklynn Lewis, MSW, LCSWA  Clinical Social  Work [phone]
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
## 2012-09-20 NOTE — Progress Notes (Signed)
Chaplain visited with pt and provided ministry of presence and emotional support.  Pt appeared very sleepy and was dozing during visit.  Pt would welcome a visit when he is not so sleepy.  Rutherford Nail Chaplain
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
## 2012-09-20 NOTE — Progress Notes (Signed)
[facility] PROGRESS NOTE  Chris [name] AVW:[id number] DOB: 08-19-1955 DOA: 09/13/2012 PCP: [name], MD  Brief narrative: Chris [name] is an 57 y.o. male with a past medical history of bilateral amputations, colon cancer status post colostomy, seizure disorder, chronic indwelling Foley catheter, pulmonary embolism status post IVC filter, recent hospitalization after a fall complicated by spleen laceration, VRE, B-cell lymphoma (Port-A-Cath placed in 2013, chemotherapy discontinued secondary to cardiac MI), who was admitted to the hospital on 09/14/2012 after another fall. Upon evaluation in the emergency department, the patient spiked a fever to 102. Urine and blood cultures were both positive for MRSA.  Assessment/Plan: Principal Problem:   Sepsis with complicated MRSA bacteremia and candidemia -ID consultation requested and performed by Dr. Judyann Munson on 09/16/2012. Blood cultures x2 positive for MRSA 09/14/2012.  Repeat blood cultures 09/16/2012 show one of 2 positive for gram-positive cocci in clusters.  Prior blood cultures from 08/29/2012 grew Candida. -Indwelling Port-A-Cath removed by interventional radiology 09/16/2012. -Continue vancomycin for MRSA with a goal trough of 15-20. Active treatment: 4 weeks. -Continue micafungin for at least 2 weeks. -Status post transesophageal echocardiogram 09/19/2012 which ruled out endocarditis.   Active Problems:   Depression -Psychiatric evaluation pending. We'll start Cymbalta.   Status post fall / left 10th rib fracture / left scapular fracture -CT scan of the abdomen and pelvis showed a left 10th rib fracture, improved splenic laceration with resolved perisplenic hematoma. No other acute post traumatic deformities identified. -Evaluated by Dr. August Saucer of orthopedics on 09/17/2012 with no surgical indication felt to be identified. -Continue PT/OT.   Stage IV decubitus ulcer left and right ischium / Deep tissue injury sacrum / Left  stump partial thickness burn -Per [facility] RN recommendations. (Seen and evaluated on 09/17/12).   Encephalopathy acute -Resolved.   Seizure disorder -No evidence on phenobarbital.   Acute respiratory failure with hypoxia -Initially there were concerns for healthcare associated pneumonia. Received Unasyn and Levaquin for 2 days. No evidence of pneumonia and therefore ID recommended discontinuation of Unasyn and Levaquin. -Continue supplemental oxygen and bronchodilators as needed.   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   Hypokalemia -Monitor and replace potassium as needed.   S/P BKA (below knee amputation) bilateral -Physical therapy evaluations when able.   MRSA UTI (urinary tract infection) -Admission urinalysis showed too numerous to count white blood cells, so he was admitted and empirically placed on Unasyn. -Urine cultures ultimately grew MRSA.   Chronic pain -Continue current pain management with narcotics, soma. -Given ongoing severe uncontrolled pain, will increase OxyContin to 100 mg every 12 hours. We'll add Cymbalta for chronic pain and depression.   Normocytic anemia -Likely AOCD.  No current indication for transfusion.  Code Status: DNR Family Communication: None at bedside. Disposition Plan: SNF.   Medical Consultants:  Dr. Judyann Munson, ID  Dr. Cammy Copa, Orthopedics  Dr. Archie Patten  [facility]  Psychiatry  Anti-infectives:  Unasyn 09/14/2012---> 09/16/2012  Vancomycin 09/14/2012--->  Levaquin 09/14/2012---> 09/16/2012  Micafungin 09/16/2012--->  HPI/Subjective: Chris [name] continues to be depressed this morning. Continues to have severe left scapular and left rib pain.  Objective: Filed Vitals:   09/19/12 1230 09/19/12 1401 09/19/12 2250 09/20/12 0646  BP: 119/59 129/56 130/56 119/55  Pulse:  102 104 106  Temp:  97.9 F (36.6 C) 99 F (37.2 C) 98.4 F (36.9 C)  TempSrc:   Oral Oral  Resp: 27 24 20 20   Height:      Weight:  SpO2:  100% 99% 95%    Intake/Output Summary (Last 24 hours) at 09/20/12 1151 Last data filed at 09/20/12 0900  Gross per 24 hour  Intake    960 ml  Output   1200 ml  Net   -240 ml    Exam: Gen:  NAD Cardiovascular:  Tachycardic, No M/R/G Respiratory:  Lungs diminished. Gastrointestinal:  Abdomen soft, NT/ND, + BS Extremities:  Bilateral amputee  Data Reviewed: Basic Metabolic Panel:  Recent Labs Lab 09/16/12 0640 09/17/12 1139 09/18/12 0355 09/19/12 0455 09/20/12 0540  NA 126* 129* 124* 128* 129*  K 3.6 3.7 3.7 4.2 4.0  CL 87* 93* 89* 91* 93*  CO2 29 28 27 30 31   GLUCOSE 118* 128* 126* 103* 109*  BUN 6 6 5* 6 6  CREATININE 0.50 0.43* 0.49* 0.46* 0.43*  CALCIUM 6.9* 7.5* 7.6* 7.5* 7.5*   GFR Estimated Creatinine Clearance: 69.7 ml/min (by C-G formula based on Cr of 0.43). Liver Function Tests:  Recent Labs Lab 09/14/12 0120 09/14/12 1526 09/15/12 0735  AST 27 30 45*  ALT 27 25 26   ALKPHOS 202* 218* 218*  BILITOT 1.2 1.1 1.0  PROT 6.9 6.9 5.7*  ALBUMIN 1.5* 1.6* 1.4*   Coagulation profile  Recent Labs Lab 09/16/12 1332  INR 1.16    CBC:  Recent Labs Lab 09/14/12 0120 09/15/12 0735 09/16/12 0640 09/18/12 0355 09/19/12 0455 09/20/12 0540  WBC 8.7 7.6 7.1 7.5 8.3 6.9  NEUTROABS 7.7  --   --   --   --   --   HGB 9.7* 8.4* 8.0* 8.8* 8.2* 7.7*  HCT 29.4* 25.1* 24.1* 27.5* 25.8* 23.9*  MCV 84.5 83.4 86.1 84.6 85.7 86.3  PLT 268 217 200 PLATELET CLUMPS NOTED ON SMEAR, COUNT APPEARS ADEQUATE 304 295   Cardiac Enzymes:  Recent Labs Lab 09/14/12 0120  CKTOTAL 114   BNP (last 3 results)  Recent Labs  11/09/11 1510 11/09/11 1858  PROBNP 393.9* 1229.0*   Microbiology Recent Results (from the past 240 hour(s))  URINE CULTURE     Status: None   Collection Time    09/14/12  2:04 AM      Result Value Range Status   Specimen Description URINE, CATHETERIZED   Final   Special Requests NONE   Final   Culture  Setup Time 09/14/2012 12:42    Final   Colony Count >=100,000 COLONIES/ML   Final   Culture     Final   Value: METHICILLIN RESISTANT STAPHYLOCOCCUS AUREUS     Note: RIFAMPIN AND GENTAMICIN SHOULD NOT BE USED AS SINGLE DRUGS FOR TREATMENT OF STAPH INFECTIONS. CRITICAL RESULT CALLED TO, READ BACK BY AND VERIFIED WITH: SUELLEN 09/16/12 AT 1130 AM BY [facility]   Report Status 09/16/2012 FINAL   Final   Organism ID, Bacteria METHICILLIN RESISTANT STAPHYLOCOCCUS AUREUS   Final  URINE CULTURE     Status: None   Collection Time    09/14/12  6:14 AM      Result Value Range Status   Specimen Description URINE, CATHETERIZED   Final   Special Requests NONE   Final   Culture  Setup Time 09/14/2012 12:42   Final   Colony Count >=100,000 COLONIES/ML   Final   Culture     Final   Value: METHICILLIN RESISTANT STAPHYLOCOCCUS AUREUS     Note: RIFAMPIN AND GENTAMICIN SHOULD NOT BE USED AS SINGLE DRUGS FOR TREATMENT OF STAPH INFECTIONS. CRITICAL RESULT CALLED TO, READ BACK BY AND VERIFIED WITH: SUELLEN 09/16/12  AT 1130 AM BY [facility]   Report Status 09/16/2012 FINAL   Final   Organism ID, Bacteria METHICILLIN RESISTANT STAPHYLOCOCCUS AUREUS   Final  CULTURE, BLOOD (ROUTINE X 2)     Status: None   Collection Time    09/14/12  5:45 PM      Result Value Range Status   Specimen Description BLOOD LEFT HAND   Final   Special Requests BOTTLES DRAWN AEROBIC ONLY    Final   Culture  Setup Time 09/14/2012 21:00   Final   Culture     Final   Value: METHICILLIN RESISTANT STAPHYLOCOCCUS AUREUS     Note: RIFAMPIN AND GENTAMICIN SHOULD NOT BE USED AS SINGLE DRUGS FOR TREATMENT OF STAPH INFECTIONS.     23 Note: Gram Stain Report Called to,Read Back By and Verified With:  SUE ELLEN GROUNDS 2 14 249PM [facility] CRITICAL RESULT CALLED TO, READ BACK BY AND VERIFIED WITH: GROUNDS RN 3PM 09/16/12 GUST[facility]   Report Status 09/17/2012 FINAL   Final   Organism ID, Bacteria METHICILLIN RESISTANT STAPHYLOCOCCUS AUREUS   Final  CULTURE, BLOOD (ROUTINE X 2)     Status: None    Collection Time    09/14/12  5:45 PM      Result Value Range Status   Specimen Description BLOOD LEFT HAND   Final   Special Requests BOTTLES DRAWN AEROBIC ONLY    Final   Culture  Setup Time 09/14/2012 21:00   Final   Culture     Final   Value: STAPHYLOCOCCUS AUREUS     Note: SUSCEPTIBILITIES PERFORMED ON PREVIOUS CULTURE WITHIN THE LAST 5 DAYS.     23 Note: Gram Stain Report Called to,Read Back By and Verified With: SUE ELLEN GROUNDS 2 14 249PM [facility]   Report Status 09/17/2012 FINAL   Final  CULTURE, BLOOD (ROUTINE X 2)     Status: None   Collection Time    09/16/12 10:40 AM      Result Value Range Status   Specimen Description BLOOD RIGHT HAND   Final   Special Requests BOTTLES DRAWN AEROBIC ONLY 1CC   Final   Culture  Setup Time 09/16/2012 14:05   Final   Culture     Final   Value:        BLOOD CULTURE RECEIVED NO GROWTH TO DATE CULTURE WILL BE HELD FOR 5 DAYS BEFORE ISSUING A FINAL NEGATIVE REPORT   Report Status PENDING   Incomplete  CULTURE, BLOOD (ROUTINE X 2)     Status: None   Collection Time    09/16/12 10:45 AM      Result Value Range Status   Specimen Description BLOOD LEFT HAND   Final   Special Requests BOTTLES DRAWN AEROBIC ONLY 1CC   Final   Culture  Setup Time 09/16/2012 14:05   Final   Culture     Final   Value: STAPHYLOCOCCUS AUREUS     Note: SUSCEPTIBILITIES PERFORMED ON PREVIOUS CULTURE WITHIN THE LAST 5 DAYS.     Note: Gram Stain Report Called to,Read Back By and Verified With: CARRIE WARD ON 09/17/2012 AT 11:53P BY [facility]   Report Status 09/19/2012 FINAL   Final  CULTURE, BLOOD (ROUTINE X 2)     Status: None   Collection Time    09/19/12  3:50 PM      Result Value Range Status   Specimen Description BLOOD LEFT HAND   Final   Special Requests BOTTLES DRAWN AEROBIC ONLY 3CC  Final   Culture  Setup Time 09/19/2012 22:27   Final   Culture     Final   Value:        BLOOD CULTURE RECEIVED NO GROWTH TO DATE CULTURE WILL BE HELD FOR 5 DAYS BEFORE ISSUING  A FINAL NEGATIVE REPORT   Report Status PENDING   Incomplete  CULTURE, BLOOD (ROUTINE X 2)     Status: None   Collection Time    09/19/12  3:57 PM      Result Value Range Status   Specimen Description BLOOD RIGHT HAND   Final   Special Requests BOTTLES DRAWN AEROBIC ONLY 3CC   Final   Culture  Setup Time 09/19/2012 22:23   Final   Culture     Final   Value:        BLOOD CULTURE RECEIVED NO GROWTH TO DATE CULTURE WILL BE HELD FOR 5 DAYS BEFORE ISSUING A FINAL NEGATIVE REPORT   Report Status PENDING   Incomplete     Procedures and Diagnostic Studies:  Dg Chest 1 View 09/14/2012  IMPRESSION: No evidence of active pulmonary disease.   Original Report Authenticated By: Burman Nieves, M.D.     Ct Abdomen Pelvis W Contrast 09/14/2012 IMPRESSION:  1.  A left tenth rib fracture which is likely acute since 08/30/2012. 2.  Improved splenic laceration with resolved perisplenic hematoma. 3.  No other acute post-traumatic deformity identified. 4.  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  The patient may benefit from a second filter in the left IVC. 5.  Subacute left-sided scapular tip fracture, incompletely imaged. 6.  New small pericardial effusion.   Original Report Authenticated By: Jeronimo Greaves, M.D.      Ct Shoulder Left Wo Contrast 09/18/2012 IMPRESSION: Probable old slightly displaced fracture of the inferior medial aspect of the tip of the scapula.  However, there is no bridging callus formation.  Is the patient tender at that site?  There is no soft tissue swelling.   Original Report Authenticated By: Francene Boyers, M.D.     Ir Removal Gap Inc W/o Fl Mod Sed 09/16/2012 IMPRESSION:  1. Technically successful tunneled Port catheter removal.   Original Report Authenticated By: D. Andria Rhein, MD     Transthoracic two-dimensional echocardiogram without contrast 09/16/2012  Study Conclusions - Left ventricle: The cavity size was normal. Wall thickness was  normal. Systolic function was normal. The estimated ejection fraction was in the range of 50% to 55%. Although no diagnostic regional wall motion abnormality was identified, this possibility cannot be completely excluded on the basis of this study. - Ventricular septum: Septal motion showed paradox. Transthoracic echocardiography. M-mode, complete 2D, spectral Doppler, and color Doppler. Height: Height: 121.9cm. Height: 48in. Weight: Weight: 80.7kg. Weight: 177.6lb. Body mass index: BMI: 54.3kg/m^2. Body surface area: BSA: 1.74m^2. Blood pressure: 109/57. Patient status: Inpatient. Location: Bedside.   Dg Shoulder Left 09/14/2012  **ADDENDUM** CREATED: 09/14/2012 02:35:43  The focal irregularity of the tip of the scapula was present on previous CT of the chest dated 08/25/2012 and is therefore not a new finding since that time.  **END ADDENDUM** SIGNED BY: Marlon Pel, M.D.   IMPRESSION: Old postoperative and post-traumatic changes in the proximal humerus.  Possible fracture of the inferior aspect of the scapula.   Original Report Authenticated By: Burman Nieves, M.D.      Scheduled Meds: . carisoprodol  350 mg Oral TID  . feeding supplement  237 mL Oral QID  .  ferrous sulfate  325 mg Oral BID WC  . heparin subcutaneous  5,000 Units Subcutaneous Q8H  . micafungin (MYCAMINE) IV  100 mg Intravenous Daily  . OxyCODONE  100 mg Oral Q12H  . pantoprazole  40 mg Oral Q1200  . PHENobarbital  32.4 mg Oral Daily  . phenobarbital  64.8 mg Oral QHS  . sodium chloride  10-40 mL Intracatheter Q12H  . vancomycin  1,250 mg Intravenous Q8H   Continuous Infusions: . sodium chloride 100 mL/hr at 09/20/12 0550  . sodium chloride      Time spent: 25 minutes.   LOS: 7 days   RAMA,CHRISTINA  [facility] Pager [phone].  If 8PM-8AM, please contact night-coverage at [email or web address], password [email or web address] 09/20/2012, 11:51 AM

## 2012-09-20 NOTE — Progress Notes (Signed)
Chris [name] with OT asked this RN to call Dr. August Saucer with Orthopedics if she could perform gentle PROM on pt's left shoulder as tolerated. She also informed this RN that pt may not tolerate pendulums, stage IV decubitus ulcer, has a lot of pain, may not tolerate OOB. This RN paged Dr. Alfonso Patten office, spoke with a PA who spoke with Dr. August Saucer and then gave permission for PROM of left shoulder. This RN called Chris [name] with OT back and informed that permission had been given.

## 2012-09-20 NOTE — Plan of Care (Signed)
Problem: Phase I Progression Outcomes Goal: OOB as tolerated unless otherwise ordered Outcome: Not Met (add Reason) Pt has bil. Above the knee amputations.

## 2012-09-20 NOTE — Progress Notes (Signed)
CSW met with pt at bedside re: SNF placement.  CSW discussed options for SNF including [facility] and [facility] and [address].  Pt is interested in going to [facility] in [place]. Pt remembers being at facility before and is agreeable to placement at [facility].  CSW notified facility who confirmed they could accept pt during weekend.   CSW spoke with MD who confirmed pt will likely be ready for d/c on Saturday 3/1.  Discussed with pt at bedside and assisted with obtaining signatures from pt for facility in order for pt to be admitted on Saturday 3/1.  CSW clarified pt questions and pt requested CSW contact pt sister, Ambrose Mantle because pt wanted pt sister to contact pt and pt wanted pt sister to be aware of plan for discharge to [facility].  CSW contacted pt sister via telephone and notified pt sister and confirmed that she had contact phone number to contact pt in pt room.  [facility] requested letter of guarantee (LOG) for placement until pt FL2 was approved through [employer]. CSW discussed with Clinical Social Work Barrister's clerk, Grandville Silos who was agreeable and provided LOG to facility. CSW previously submitted pt FL2 to [facility] on 09/18/2012.   CSW will notify weekend social worker of plan to discharge to [facility] over weekend in order for weekend CSW to assist with pt discharge to SNF.  Jacklynn Lewis, MSW, LCSWA  Clinical Social Work [phone]

## 2012-09-20 NOTE — Progress Notes (Addendum)
Pt c/o of pain 9/10. This RN medicated pt. Pt making statement, "I can't stand this pain. I wish I had a gun and could end this." This RN asked pt if wanted to harm himself, pt denies. Pt also denies having a plan. This RN to continue to monitor.

## 2012-09-21 ENCOUNTER — Encounter (HOSPITAL_COMMUNITY): Payer: Self-pay | Admitting: Internal Medicine

## 2012-09-21 DIAGNOSIS — F329 Major depressive disorder, single episode, unspecified: Secondary | ICD-10-CM | POA: Diagnosis present

## 2012-09-21 DIAGNOSIS — F4310 Post-traumatic stress disorder, unspecified: Secondary | ICD-10-CM

## 2012-09-21 DIAGNOSIS — F32A Depression, unspecified: Secondary | ICD-10-CM | POA: Diagnosis present

## 2012-09-21 HISTORY — DX: Depression, unspecified: F32.A

## 2012-09-21 HISTORY — DX: Post-traumatic stress disorder, unspecified: F43.10

## 2012-09-21 LAB — VANCOMYCIN, TROUGH: Vancomycin Tr: 17 ug/mL (ref 10.0–20.0)

## 2012-09-21 MED ORDER — HEPARIN SOD (PORK) LOCK FLUSH 100 UNIT/ML IV SOLN
INTRAVENOUS | Status: AC
  Administered 2012-09-21: 250 [IU]
  Filled 2012-09-21: qty 5

## 2012-09-21 MED ORDER — OXYCODONE HCL 30 MG PO TABS: 30.0000 mg | ORAL_TABLET | ORAL | Status: DC | PRN

## 2012-09-21 MED ORDER — ALPRAZOLAM 1 MG PO TABS: 1.0000 mg | ORAL_TABLET | Freq: Three times a day (TID) | ORAL | Status: DC

## 2012-09-21 MED ORDER — SODIUM CHLORIDE 0.9 % IV SOLN: 100.0000 mg | Freq: Every day | INTRAVENOUS | Status: DC

## 2012-09-21 MED ORDER — OXYCODONE HCL ER 20 MG PO T12A: 100.0000 mg | EXTENDED_RELEASE_TABLET | Freq: Two times a day (BID) | ORAL | Status: DC

## 2012-09-21 MED ORDER — ENSURE COMPLETE PO LIQD: 237.0000 mL | Freq: Four times a day (QID) | ORAL | Status: AC

## 2012-09-21 MED ORDER — FERROUS SULFATE 325 (65 FE) MG PO TABS: 325.0000 mg | ORAL_TABLET | Freq: Two times a day (BID) | ORAL | Status: AC

## 2012-09-21 MED ORDER — DULOXETINE HCL 30 MG PO CPEP: 30.0000 mg | ORAL_CAPSULE | Freq: Every day | ORAL | Status: DC

## 2012-09-21 MED ORDER — ONDANSETRON HCL 4 MG PO TABS: 4.0000 mg | ORAL_TABLET | Freq: Four times a day (QID) | ORAL | Status: DC | PRN

## 2012-09-21 MED ORDER — VANCOMYCIN HCL 10 G IV SOLR: 1250.0000 mg | Freq: Three times a day (TID) | INTRAVENOUS | Status: DC

## 2012-09-21 NOTE — Progress Notes (Signed)
Per MD, Pt ready for d/c.  Notified RN, Pt and facility.   Pt notified family.  Sent d/c summary.  Confirmed receipt of d/c summary.  Facility ready to receive Pt.  Arranged for transportation.  Providence Crosby, LCSWA Clinical Social Work [phone]

## 2012-09-21 NOTE — Progress Notes (Signed)
ANTIBIOTIC CONSULT NOTE - FOLLOW UP  Pharmacy Consult for Vancomycin Indication: MRSA bacteremia  Allergies  Allergen Reactions  . Fish Allergy Anaphylaxis    Pt can tolerate shellfish  . Ibuprofen Other (See Comments)    hallucinations  . Ketorolac Tromethamine Other (See Comments)    hallucination  . Naproxen Other (See Comments)    hallucination    Patient Measurements: Height: 4' (121.9 cm) Weight: 185 lb 13.6 oz (84.3 kg) IBW/kg (Calculated) : 22.4 Adjusted Body Weight:  Vital Signs: Temp: 99.2 F (37.3 C) (03/01 0459) Temp src: Oral (03/01 0459) BP: 121/68 mmHg (03/01 0459) Pulse Rate: 86 (03/01 0459) Intake/Output from previous day: 02/28 0701 - 03/01 0700 In: 1400 [P.O.:600; I.V.:800] Out: 3700 [Urine:3700] Intake/Output from this shift:    Labs:  Recent Labs  09/19/12 0455 09/20/12 0540  WBC 8.3 6.9  HGB 8.2* 7.7*  PLT 304 295  CREATININE 0.46* 0.43*   Estimated Creatinine Clearance: 69.7 ml/min (by C-G formula based on Cr of 0.43).  Recent Labs  09/18/12 1712 09/21/12 0500  VANCOTROUGH 13.8 17.0     Microbiology: Recent Results (from the past 720 hour(s))  URINE CULTURE     Status: None   Collection Time    09/14/12  2:04 AM      Result Value Range Status   Specimen Description URINE, CATHETERIZED   Final   Special Requests NONE   Final   Culture  Setup Time 09/14/2012 12:42   Final   Colony Count >=100,000 COLONIES/ML   Final   Culture     Final   Value: METHICILLIN RESISTANT STAPHYLOCOCCUS AUREUS     Note: RIFAMPIN AND GENTAMICIN SHOULD NOT BE USED AS SINGLE DRUGS FOR TREATMENT OF STAPH INFECTIONS. CRITICAL RESULT CALLED TO, READ BACK BY AND VERIFIED WITH: SUELLEN 09/16/12 AT 1130 AM BY [facility]   Report Status 09/16/2012 FINAL   Final   Organism ID, Bacteria METHICILLIN RESISTANT STAPHYLOCOCCUS AUREUS   Final  URINE CULTURE     Status: None   Collection Time    09/14/12  6:14 AM      Result Value Range Status   Specimen Description  URINE, CATHETERIZED   Final   Special Requests NONE   Final   Culture  Setup Time 09/14/2012 12:42   Final   Colony Count >=100,000 COLONIES/ML   Final   Culture     Final   Value: METHICILLIN RESISTANT STAPHYLOCOCCUS AUREUS     Note: RIFAMPIN AND GENTAMICIN SHOULD NOT BE USED AS SINGLE DRUGS FOR TREATMENT OF STAPH INFECTIONS. CRITICAL RESULT CALLED TO, READ BACK BY AND VERIFIED WITH: SUELLEN 09/16/12 AT 1130 AM BY [facility]   Report Status 09/16/2012 FINAL   Final   Organism ID, Bacteria METHICILLIN RESISTANT STAPHYLOCOCCUS AUREUS   Final  CULTURE, BLOOD (ROUTINE X 2)     Status: None   Collection Time    09/14/12  5:45 PM      Result Value Range Status   Specimen Description BLOOD LEFT HAND   Final   Special Requests BOTTLES DRAWN AEROBIC ONLY    Final   Culture  Setup Time 09/14/2012 21:00   Final   Culture     Final   Value: METHICILLIN RESISTANT STAPHYLOCOCCUS AUREUS     Note: RIFAMPIN AND GENTAMICIN SHOULD NOT BE USED AS SINGLE DRUGS FOR TREATMENT OF STAPH INFECTIONS.     23 Note: Gram Stain Report Called to,Read Back By and Verified With:  SUE ELLEN GROUNDS 2 14 249PM  [facility] CRITICAL RESULT CALLED TO, READ BACK BY AND VERIFIED WITH: GROUNDS RN 3PM 09/16/12 GUST[facility]   Report Status 09/17/2012 FINAL   Final   Organism ID, Bacteria METHICILLIN RESISTANT STAPHYLOCOCCUS AUREUS   Final  CULTURE, BLOOD (ROUTINE X 2)     Status: None   Collection Time    09/14/12  5:45 PM      Result Value Range Status   Specimen Description BLOOD LEFT HAND   Final   Special Requests BOTTLES DRAWN AEROBIC ONLY    Final   Culture  Setup Time 09/14/2012 21:00   Final   Culture     Final   Value: STAPHYLOCOCCUS AUREUS     Note: SUSCEPTIBILITIES PERFORMED ON PREVIOUS CULTURE WITHIN THE LAST 5 DAYS.     23 Note: Gram Stain Report Called to,Read Back By and Verified With: SUE ELLEN GROUNDS 2 14 249PM [facility]   Report Status 09/17/2012 FINAL   Final  CULTURE, BLOOD (ROUTINE X 2)     Status: None   Collection  Time    09/16/12 10:40 AM      Result Value Range Status   Specimen Description BLOOD RIGHT HAND   Final   Special Requests BOTTLES DRAWN AEROBIC ONLY 1CC   Final   Culture  Setup Time 09/16/2012 14:05   Final   Culture     Final   Value:        BLOOD CULTURE RECEIVED NO GROWTH TO DATE CULTURE WILL BE HELD FOR 5 DAYS BEFORE ISSUING A FINAL NEGATIVE REPORT   Report Status PENDING   Incomplete  CULTURE, BLOOD (ROUTINE X 2)     Status: None   Collection Time    09/16/12 10:45 AM      Result Value Range Status   Specimen Description BLOOD LEFT HAND   Final   Special Requests BOTTLES DRAWN AEROBIC ONLY 1CC   Final   Culture  Setup Time 09/16/2012 14:05   Final   Culture     Final   Value: STAPHYLOCOCCUS AUREUS     Note: SUSCEPTIBILITIES PERFORMED ON PREVIOUS CULTURE WITHIN THE LAST 5 DAYS.     Note: Gram Stain Report Called to,Read Back By and Verified With: CARRIE WARD ON 09/17/2012 AT 11:53P BY [facility]   Report Status 09/19/2012 FINAL   Final  CULTURE, BLOOD (ROUTINE X 2)     Status: None   Collection Time    09/19/12  3:50 PM      Result Value Range Status   Specimen Description BLOOD LEFT HAND   Final   Special Requests BOTTLES DRAWN AEROBIC ONLY 3CC   Final   Culture  Setup Time 09/19/2012 22:27   Final   Culture     Final   Value:        BLOOD CULTURE RECEIVED NO GROWTH TO DATE CULTURE WILL BE HELD FOR 5 DAYS BEFORE ISSUING A FINAL NEGATIVE REPORT   Report Status PENDING   Incomplete  CULTURE, BLOOD (ROUTINE X 2)     Status: None   Collection Time    09/19/12  3:57 PM      Result Value Range Status   Specimen Description BLOOD RIGHT HAND   Final   Special Requests BOTTLES DRAWN AEROBIC ONLY 3CC   Final   Culture  Setup Time 09/19/2012 22:23   Final   Culture     Final   Value:        BLOOD CULTURE RECEIVED NO GROWTH TO DATE CULTURE  WILL BE HELD FOR 5 DAYS BEFORE ISSUING A FINAL NEGATIVE REPORT   Report Status PENDING   Incomplete    Anti-infectives   Start     Dose/Rate  Route Frequency Ordered Stop   09/19/12 0200  vancomycin (VANCOCIN) 1,250 mg in sodium chloride 0.9 % 250 mL IVPB     1,250 mg 166.7 mL/hr over 90 Minutes Intravenous Every 8 hours 09/18/12 1846     09/17/12 0200  vancomycin (VANCOCIN) IVPB 1000 mg/200 mL premix  Status:  Discontinued     1,000 mg 200 mL/hr over 60 Minutes Intravenous Every 8 hours 09/16/12 1828 09/18/12 1845   09/16/12 1600  micafungin (MYCAMINE) 100 mg in sodium chloride 0.9 % 100 mL IVPB     100 mg 100 mL/hr over 1 Hours Intravenous Daily 09/16/12 1441 09/30/12 0959   09/16/12 1200  ceFAZolin (ANCEF) IVPB 1 g/50 mL premix  Status:  Discontinued     1 g 100 mL/hr over 30 Minutes Intravenous Every 8 hours 09/16/12 1011 09/16/12 1534   09/15/12 2000  levofloxacin (LEVAQUIN) tablet 750 mg  Status:  Discontinued     750 mg Oral Every 24 hours 09/15/12 1721 09/16/12 1013   09/14/12 1800  levofloxacin (LEVAQUIN) IVPB 750 mg  Status:  Discontinued     750 mg 100 mL/hr over 90 Minutes Intravenous Every 24 hours 09/14/12 1729 09/15/12 1721   09/14/12 1700  Ampicillin-Sulbactam (UNASYN) 3 g in sodium chloride 0.9 % 100 mL IVPB  Status:  Discontinued     3 g 100 mL/hr over 60 Minutes Intravenous Every 6 hours 09/14/12 1624 09/16/12 1009   09/14/12 1700  vancomycin (VANCOCIN) IVPB 1000 mg/200 mL premix  Status:  Discontinued     1,000 mg 200 mL/hr over 60 Minutes Intravenous Every 12 hours 09/14/12 1624 09/16/12 1828   09/14/12 1100  fluconazole (DIFLUCAN) tablet 100 mg  Status:  Discontinued     100 mg Oral Daily 09/14/12 1016 09/16/12 1441   09/14/12 0530  cefTRIAXone (ROCEPHIN) 1 g in dextrose 5 % 50 mL IVPB     1 g 100 mL/hr over 30 Minutes Intravenous  Once 09/14/12 0520 09/14/12 0700      Assessment:  56 YOM andmitted 2/22 after fall and L sided pain in abdomen in chest. 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 Pt was started on  unasyn/levaquin and vancomycin 2/22 for PNA/UTI/decubitus ulcers. 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 TEE negative for vegetations. Micafungin for previously UNDERtreated candidemia from earlier admission in Feb at [facility]  2/22 >> Vanc >> 2/22 >> Unasyn >> 2/24 2/22 >> Levaquin >> 2/24 2/24 >> Ancef >> 2/24 2/24 >> Micafungin >>  Tmax: 99.2 WBCs: 6.9 Renal: SCr stable CrCl >100 (CG and N) however pt is BL amputee therefore CrCl may be overestimated  2/22 blood: 2/2 staph aureus - MRSA (vanco MIC = 1) 2/22 urine: 2/2 staph aureus - MRSA 2/27 blood: ngtd  Dose changes/drug level info:  2/24 Seen by ID --> abx changed to vanc/ancef (has been on vanc/unasyn/LVQ) 2/24 1700 VT = 7.5 dose increased from 1g q12 to 1g q8 2/26 1730 VT = 13.8, dose increased from 1g q8h to 1250 q8h. 3/1 0530 VT = 17 on 1250mg  IV q8h (prior to 6th dose)  Goal of Therapy:  Vancomycin trough level 15-20 mcg/ml  Plan:  D#8 (of 28) vancomycin  Continue current vancomycin dose of 1250mg  IV q8h as level therapeutic  I would recheck trough early next week to evaluate for accumulation although he has been on q8h dosing since 2/24.    If q12h dosing is needed post-discharge (ie. Unable to do q8h at SNF), would rec 1750mg  IV q12h for est pk/trough 41/12.5 mcg/ml  Micafungin to continue until 3/10 as per ID  Dannielle Huh 09/21/2012,7:02 AM

## 2012-09-21 NOTE — Discharge Summary (Addendum)
Physician Discharge Summary  Chris [name] Brents ZOX:[id number] DOB: 12-Jun-1956 DOA: 09/13/2012  PCP: [name], MD  Admit date: 09/13/2012 Discharge date: 09/21/2012  Recommendations for Outpatient Follow-up:  1. The patient should get weekly vancomycin trough levels checked with a goal of a trough level of 15-20. He should also have a weekly CBC and BMP. 2. Please titrate Cymbalta up to 60 mg daily in one week. 3. Outpatient physical therapy recommended for maintenance of mobility. 4. The patient should followup in the infectious disease clinic in 3-4 weeks. 5. Wound care:  Aquacel to absorb drainage and provide antimicrobial benefits with ABD pad over site to protect bilaterally ischial areas. Foam dressings should be applied to sacrum and left stump. Ostomy supplies as needed. Would recommend ongoing wound care and outpatient followup at the [facility] wound care after discharge from the SNF.  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  Diet recommendation: Regular.  History of present illness:  Chris [name] is an 57 y.o. male with a past medical history of bilateral amputations, colon cancer status post colostomy, seizure disorder, chronic indwelling Foley catheter, pulmonary embolism status post IVC filter, recent hospitalization after a fall complicated by spleen laceration, VRE, B-cell lymphoma (Port-A-Cath  placed in 2013, chemotherapy discontinued secondary to cardiac MI), who was admitted to the hospital on 09/14/2012 after another fall. Upon evaluation in the emergency department, the patient spiked a fever to 102. Urine and blood cultures were both positive for MRSA.  Hospital Course by problem:  Principal Problem:  Sepsis with complicated MRSA bacteremia and candidemia  -ID consultation requested and performed by Dr. Judyann Munson on 09/16/2012. Blood cultures x2 positive for MRSA 09/14/2012. Repeat blood cultures 09/16/2012 show one of 2 positive for MRSA. Blood cultures drawn on 09/19/2012 are negative to date.  Prior blood cultures from 08/29/2012 grew Candida.  -Indwelling Port-A-Cath removed by interventional radiology 09/16/2012. PICC line subsequently placed on 09/19/2012. -Continue vancomycin for MRSA with a goal trough of 15-20. Active treatment: 4 weeks with stop date 10/17/2012.  -Continue micafungin for 2 weeks, with stop date on 09/30/2012.  -Status post transesophageal echocardiogram 09/19/2012 which ruled out endocarditis.  Active Problems:  Depression / PTSD  -Psychiatric evaluation done by Dr. Ferol Luz on 09/20/2012. Continue Cymbalta, and titrate to 60 mg in one week if tolerated.  Status post fall / left 10th rib fracture / left scapular fracture  -CT scan of the abdomen and pelvis showed a left 10th rib fracture, improved splenic laceration with resolved perisplenic hematoma. No other acute post traumatic deformities identified.  -Evaluated by Dr. August Saucer of orthopedics on 09/17/2012 with no surgical indication felt to be identified.  -Continue PT/OT.  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 (Seen and evaluated on  09/17/12). Per her recs: Wound type:   Left ischium healing stage 4 3X.2X.1cm, 100% red, mod yellow drainage, no odor   Right ischium healing stage 4 4X4X.2cm, 100% red, mod yellow  drainage, no odor   Sacrum with deep tissue injury 1X.1cm, dark purple. Pt fell prior to admission and states he lay in that location "for awhile."   Left stump with partial thickness burn, .2X.2X.1cm, 90% red, 10% yellow, minimal yellow drainage, no odor.   Colostomy with pouch intact with good seal, mod amt semi formed stool. Did not remove to assess stoma, which appears to be red and viable through pouch. Pt denies any problems with peristomal skin or pouching routines.  -Pressure Ulcer POA: Yes  -Dressing procedure/placement/frequency: Continue present plan of care which was ordered prior to admission with Aquacel to absorb drainage and provide antimicrobial benefits with abd pad over site to protect bilat ischium areas. Pt declined offer of air mattress to reduce pressure. Foam dressing to promote healing to sacrum and left stump.  Pt can resume follow-up with [facility] for wound care after discharge. Encephalopathy acute  -Resolved.  Seizure disorder  -No evidence on phenobarbital.  Acute respiratory failure with hypoxia  -Initially there were concerns for healthcare associated pneumonia. Received Unasyn and Levaquin for 2 days. No evidence of pneumonia and therefore ID recommended discontinuation of Unasyn and Levaquin.  -Continue supplemental oxygen PRN and bronchodilators as needed.  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  Hypokalemia  -Monitor and replace potassium as needed.  S/P BKA (below knee amputation) bilateral  -Physical therapy evaluations when able.  MRSA UTI (urinary tract infection)  -Admission urinalysis showed too numerous to count white blood cells, so he was admitted and empirically placed on Unasyn.  -Urine cultures ultimately grew MRSA.  Chronic pain  -Continue current pain management with narcotics, soma.  -Given ongoing severe uncontrolled pain, we increased OxyContin to 100 mg every 12 hours. We added Cymbalta for chronic pain and depression, which can be titrated  up to 60 mg daily in one week, if tolerated.  Normocytic anemia  -Likely AOCD. No current indication for transfusion.  Medical Consultants:  Dr. Judyann Munson, ID  Dr. Cammy Copa, Orthopedics  Dr. Deanne Coffer, IR  Dr. Elly Modena, [facility]  Dr. Mickeal Skinner, Psychiatry   Discharge Exam: Filed Vitals:   09/21/12 1310  BP:   Pulse:   Temp: 98.2 F (36.8 C)  Resp:    Filed Vitals:   09/20/12 2205 09/21/12 0459 09/21/12 0745 09/21/12 1310  BP: 116/59 121/68    Pulse: 89 86    Temp: 98 F (36.7 C) 99.2 F (37.3 C) 98 F (36.7 C) 98.2 F (36.8 C)  TempSrc: Oral Oral    Resp: 18 19    Height:      Weight:      SpO2: 96% 97%      Gen:  NAD Cardiovascular:  RRR, No M/R/G Respiratory: Lungs CTAB Gastrointestinal: Abdomen soft, NT/ND with normal active bowel sounds. Extremities: Bilateral amputee   Discharge Instructions      Discharge Orders   Future Orders Complete By Expires     Call MD for:  persistant nausea and vomiting  As directed     Call MD for:  severe uncontrolled pain  As directed     Call MD for:  temperature >100.4  As directed     Diet general  As directed     Increase activity slowly  As directed  Vital signs  As directed     Comments:      Upon arrival to nursing area and as patient's condition warrants        Medication List    STOP taking these medications       fluconazole 100 MG tablet  Commonly known as:  DIFLUCAN     oxyCODONE 80 MG 12 hr tablet  Commonly known as:  OXYCONTIN      TAKE these medications       albuterol 108 (90 BASE) MCG/ACT inhaler  Commonly known as:  PROVENTIL HFA;VENTOLIN HFA  Inhale 2 puffs into the lungs every 6 (six) hours as needed for wheezing.     ALPRAZolam 1 MG tablet  Commonly known as:  XANAX  Take 1 tablet (1 mg total) by mouth 3 (three) times daily.     carisoprodol 350 MG tablet  Commonly known as:  SOMA  Take 350 mg by mouth 3 (three) times daily.     DULoxetine  30 MG capsule  Commonly known as:  CYMBALTA  Take 1 capsule (30 mg total) by mouth daily.     feeding supplement Liqd  Take 237 mLs by mouth 4 (four) times daily.     ferrous sulfate 325 (65 FE) MG tablet  Take 1 tablet (325 mg total) by mouth 2 (two) times daily with a meal.     lisinopril 10 MG tablet  Commonly known as:  PRINIVIL,ZESTRIL  Take 10 mg by mouth every morning.     ondansetron 4 MG tablet  Commonly known as:  ZOFRAN  Take 1 tablet (4 mg total) by mouth every 6 (six) hours as needed for nausea.     oxycodone 30 MG immediate release tablet  Commonly known as:  ROXICODONE  Take 1 tablet (30 mg total) by mouth every 3 (three) hours as needed (for breakthrough pain). pain     OxyCODONE 20 mg T12a  Commonly known as:  OXYCONTIN  Take 5 tablets (100 mg total) by mouth every 12 (twelve) hours.     pantoprazole 40 MG tablet  Commonly known as:  PROTONIX  Take 1 tablet (40 mg total) by mouth daily at 12 noon.     PHENobarbital 32.4 MG tablet  Commonly known as:  LUMINAL  Take 32.4-64.8 mg by mouth 2 (two) times daily. Take 1 tablet in the morning and 2 tablets at in the evening     sodium chloride 0.9 % SOLN 100 mL with micafungin 50 MG SOLR 100 mg  Inject 100 mg into the vein daily.     sodium chloride 0.9 % SOLN 250 mL with vancomycin 10 G SOLR 1,250 mg  Inject 1,250 mg into the vein every 8 (eight) hours.       Follow-up Information   Follow up with Judyann Munson, MD In 3 weeks. (Call for hospital follow up appt)    Contact information:   [address] [place] [address] [phone]       Follow up with [name], MD. (As needed)    Contact information:   [address] [place] [address] [phone]        The results of significant diagnostics from this hospitalization (including imaging, microbiology, ancillary and laboratory) are listed below for reference.    Significant Diagnostic Studies: Dg Chest 1  View 09/14/2012 IMPRESSION: No evidence of active pulmonary disease. Original Report Authenticated By: Burman Nieves, M.D.  Ct Abdomen Pelvis W Contrast 09/14/2012  IMPRESSION: 1. A left tenth rib fracture which is likely acute since 08/30/2012. 2. Improved splenic laceration with resolved perisplenic hematoma. 3. No other acute post-traumatic deformity identified. 4. 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 The patient may benefit from a second filter in the left IVC. 5. Subacute left-sided scapular tip fracture, incompletely imaged. 6. New small pericardial effusion. Original Report Authenticated By: Jeronimo Greaves, M.D.  Ct Shoulder Left Wo Contrast 09/18/2012 IMPRESSION: Probable old slightly displaced fracture of the inferior medial aspect of the tip of the scapula. However, there is no bridging callus formation. Is the patient tender at that site? There is no soft tissue swelling. Original Report Authenticated By: Francene Boyers, M.D.  Ir Removal Gap Inc W/o Fl Mod Sed 09/16/2012 IMPRESSION: 1. Technically successful tunneled Port catheter removal. Original Report Authenticated By: D. Andria Rhein, MD  Transthoracic two-dimensional echocardiogram without contrast 09/16/2012 Study Conclusions - Left ventricle: The cavity size was normal. Wall thickness was normal. Systolic function was normal. The estimated ejection fraction was in the range of 50% to 55%. Although no diagnostic regional wall motion abnormality was identified, this possibility cannot be completely excluded on the basis of this study. - Ventricular septum: Septal motion showed paradox. Transthoracic echocardiography. M-mode, complete 2D, spectral Doppler, and color Doppler. Height: Height: 121.9cm. Height: 48in. Weight: Weight: 80.7kg. Weight: 177.6lb. Body mass index: BMI: 54.3kg/m^2. Body surface area: BSA: 1.93m^2. Blood pressure: 109/57. Patient status: Inpatient. Location: Bedside. Dg Shoulder Left  09/14/2012 **ADDENDUM** CREATED: 09/14/2012 02:35:43 The focal irregularity of the tip of the scapula was present on previous CT of the chest dated 08/25/2012 and is therefore not a new finding since that time. **END ADDENDUM** SIGNED BY: Marlon Pel, M.D. IMPRESSION: Old postoperative and post-traumatic changes in the proximal humerus. Possible fracture of the inferior aspect of the scapula. Original Report Authenticated By: Burman Nieves, M.D.  Transesophageal echocardiogram 09/19/2012: Negative for endocarditis. LVEF 50% with septal bounce.     Labs:  Basic Metabolic Panel:  Recent Labs Lab 09/16/12 0640 09/17/12 1139 09/18/12 0355 09/19/12 0455 09/20/12 0540  NA 126* 129* 124* 128* 129*  K 3.6 3.7 3.7 4.2 4.0  CL 87* 93* 89* 91* 93*  CO2 29 28 27 30 31   GLUCOSE 118* 128* 126* 103* 109*  BUN 6 6 5* 6 6  CREATININE 0.50 0.43* 0.49* 0.46* 0.43*  CALCIUM 6.9* 7.5* 7.6* 7.5* 7.5*   GFR Estimated Creatinine Clearance: 69.7 ml/min (by C-G formula based on Cr of 0.43). Liver Function Tests:  Recent Labs Lab 09/14/12 1526 09/15/12 0735  AST 30 45*  ALT 25 26  ALKPHOS 218* 218*  BILITOT 1.1 1.0  PROT 6.9 5.7*  ALBUMIN 1.6* 1.4*   Coagulation profile  Recent Labs Lab 09/16/12 1332  INR 1.16    CBC:  Recent Labs Lab 09/15/12 0735 09/16/12 0640 09/18/12 0355 09/19/12 0455 09/20/12 0540  WBC 7.6 7.1 7.5 8.3 6.9  HGB 8.4* 8.0* 8.8* 8.2* 7.7*  HCT 25.1* 24.1* 27.5* 25.8* 23.9*  MCV 83.4 86.1 84.6 85.7 86.3  PLT 217 200 PLATELET CLUMPS NOTED ON SMEAR, COUNT APPEARS ADEQUATE 304 295   Microbiology Recent Results (from the past 240 hour(s))  URINE CULTURE     Status: None   Collection Time    09/14/12  2:04 AM      Result Value Range Status   Specimen Description URINE, CATHETERIZED   Final   Special Requests NONE   Final   Culture  Setup Time 09/14/2012 12:42   Final   Colony Count >=100,000 COLONIES/ML   Final   Culture     Final   Value:  METHICILLIN RESISTANT STAPHYLOCOCCUS AUREUS     Note: RIFAMPIN AND GENTAMICIN SHOULD NOT BE USED AS SINGLE DRUGS FOR TREATMENT OF STAPH INFECTIONS. CRITICAL RESULT CALLED TO, READ BACK BY AND VERIFIED WITH: SUELLEN 09/16/12 AT 1130 AM BY [facility]   Report Status 09/16/2012 FINAL   Final   Organism ID, Bacteria METHICILLIN RESISTANT STAPHYLOCOCCUS AUREUS   Final  URINE CULTURE     Status: None   Collection Time    09/14/12  6:14 AM      Result Value Range Status   Specimen Description URINE, CATHETERIZED   Final   Special Requests NONE   Final   Culture  Setup Time 09/14/2012 12:42   Final   Colony Count >=100,000 COLONIES/ML   Final   Culture     Final   Value: METHICILLIN RESISTANT STAPHYLOCOCCUS AUREUS     Note: RIFAMPIN AND GENTAMICIN SHOULD NOT BE USED AS SINGLE DRUGS FOR TREATMENT OF STAPH INFECTIONS. CRITICAL RESULT CALLED TO, READ BACK BY AND VERIFIED WITH: SUELLEN 09/16/12 AT 1130 AM BY [facility]   Report Status 09/16/2012 FINAL   Final   Organism ID, Bacteria METHICILLIN RESISTANT STAPHYLOCOCCUS AUREUS   Final  CULTURE, BLOOD (ROUTINE X 2)     Status: None   Collection Time    09/14/12  5:45 PM      Result Value Range Status   Specimen Description BLOOD LEFT HAND   Final   Special Requests BOTTLES DRAWN AEROBIC ONLY    Final   Culture  Setup Time 09/14/2012 21:00   Final   Culture     Final   Value: METHICILLIN RESISTANT STAPHYLOCOCCUS AUREUS     Note: RIFAMPIN AND GENTAMICIN SHOULD NOT BE USED AS SINGLE DRUGS FOR TREATMENT OF STAPH INFECTIONS.     23 Note: Gram Stain Report Called to,Read Back By and Verified With:  SUE ELLEN GROUNDS 2 14 249PM [facility] CRITICAL RESULT CALLED TO, READ BACK BY AND VERIFIED WITH: GROUNDS RN 3PM 09/16/12 [facility]   Report Status 09/17/2012 FINAL   Final   Organism ID, Bacteria METHICILLIN RESISTANT STAPHYLOCOCCUS AUREUS   Final  CULTURE, BLOOD (ROUTINE X 2)     Status: None   Collection Time    09/14/12  5:45 PM      Result Value Range Status    Specimen Description BLOOD LEFT HAND   Final   Special Requests BOTTLES DRAWN AEROBIC ONLY    Final   Culture  Setup Time 09/14/2012 21:00   Final   Culture     Final   Value: STAPHYLOCOCCUS AUREUS     Note: SUSCEPTIBILITIES PERFORMED ON PREVIOUS CULTURE WITHIN THE LAST 5 DAYS.     23 Note: Gram Stain Report Called to,Read Back By and Verified With: SUE ELLEN GROUNDS 2 14 249PM [facility]   Report Status 09/17/2012 FINAL   Final  CULTURE, BLOOD (ROUTINE X 2)     Status: None   Collection Time    09/16/12 10:40 AM      Result Value Range Status   Specimen Description BLOOD RIGHT HAND   Final   Special Requests BOTTLES DRAWN AEROBIC ONLY 1CC   Final   Culture  Setup Time 09/16/2012 14:05   Final   Culture     Final   Value:        BLOOD  CULTURE RECEIVED NO GROWTH TO DATE CULTURE WILL BE HELD FOR 5 DAYS BEFORE ISSUING A FINAL NEGATIVE REPORT   Report Status PENDING   Incomplete  CULTURE, BLOOD (ROUTINE X 2)     Status: None   Collection Time    09/16/12 10:45 AM      Result Value Range Status   Specimen Description BLOOD LEFT HAND   Final   Special Requests BOTTLES DRAWN AEROBIC ONLY 1CC   Final   Culture  Setup Time 09/16/2012 14:05   Final   Culture     Final   Value: STAPHYLOCOCCUS AUREUS     Note: SUSCEPTIBILITIES PERFORMED ON PREVIOUS CULTURE WITHIN THE LAST 5 DAYS.     Note: Gram Stain Report Called to,Read Back By and Verified With: CARRIE WARD ON 09/17/2012 AT 11:53P BY [facility]   Report Status 09/19/2012 FINAL   Final  CULTURE, BLOOD (ROUTINE X 2)     Status: None   Collection Time    09/19/12  3:50 PM      Result Value Range Status   Specimen Description BLOOD LEFT HAND   Final   Special Requests BOTTLES DRAWN AEROBIC ONLY 3CC   Final   Culture  Setup Time 09/19/2012 22:27   Final   Culture     Final   Value:        BLOOD CULTURE RECEIVED NO GROWTH TO DATE CULTURE WILL BE HELD FOR 5 DAYS BEFORE ISSUING A FINAL NEGATIVE REPORT   Report Status PENDING   Incomplete  CULTURE,  BLOOD (ROUTINE X 2)     Status: None   Collection Time    09/19/12  3:57 PM      Result Value Range Status   Specimen Description BLOOD RIGHT HAND   Final   Special Requests BOTTLES DRAWN AEROBIC ONLY 3CC   Final   Culture  Setup Time 09/19/2012 22:23   Final   Culture     Final   Value:        BLOOD CULTURE RECEIVED NO GROWTH TO DATE CULTURE WILL BE HELD FOR 5 DAYS BEFORE ISSUING A FINAL NEGATIVE REPORT   Report Status PENDING   Incomplete    Time coordinating discharge: 35 minutes.  Signed:  Inza Mikrut  Pager [phone] [facility] 09/21/2012, 1:22 PM

## 2012-09-22 LAB — CULTURE, BLOOD (ROUTINE X 2): Culture: NO GROWTH

## 2012-09-25 LAB — CULTURE, BLOOD (ROUTINE X 2): Culture: NO GROWTH

## 2012-10-17 ENCOUNTER — Inpatient Hospital Stay: Payer: Medicaid Other | Admitting: Internal Medicine

## 2012-10-24 ENCOUNTER — Inpatient Hospital Stay: Payer: Medicaid Other | Admitting: Internal Medicine

## 2012-11-11 ENCOUNTER — Encounter (HOSPITAL_COMMUNITY): Payer: Self-pay | Admitting: Emergency Medicine

## 2012-11-11 ENCOUNTER — Observation Stay (HOSPITAL_COMMUNITY)
Admission: EM | Admit: 2012-11-11 | Discharge: 2012-11-14 | Disposition: A | Discharge status: Home / Self Care | Payer: Medicaid Other | Attending: Internal Medicine | Admitting: Internal Medicine

## 2012-11-11 ENCOUNTER — Emergency Department (HOSPITAL_COMMUNITY): Payer: Medicaid Other

## 2012-11-11 DIAGNOSIS — F4310 Post-traumatic stress disorder, unspecified: Secondary | ICD-10-CM

## 2012-11-11 DIAGNOSIS — A491 Streptococcal infection, unspecified site: Secondary | ICD-10-CM

## 2012-11-11 DIAGNOSIS — R739 Hyperglycemia, unspecified: Secondary | ICD-10-CM

## 2012-11-11 DIAGNOSIS — M25559 Pain in unspecified hip: Principal | ICD-10-CM | POA: Insufficient documentation

## 2012-11-11 DIAGNOSIS — D649 Anemia, unspecified: Secondary | ICD-10-CM

## 2012-11-11 DIAGNOSIS — B9562 Methicillin resistant Staphylococcus aureus infection as the cause of diseases classified elsewhere: Secondary | ICD-10-CM

## 2012-11-11 DIAGNOSIS — G8929 Other chronic pain: Secondary | ICD-10-CM

## 2012-11-11 DIAGNOSIS — Z978 Presence of other specified devices: Secondary | ICD-10-CM

## 2012-11-11 DIAGNOSIS — E876 Hypokalemia: Secondary | ICD-10-CM

## 2012-11-11 DIAGNOSIS — A419 Sepsis, unspecified organism: Secondary | ICD-10-CM

## 2012-11-11 DIAGNOSIS — F329 Major depressive disorder, single episode, unspecified: Secondary | ICD-10-CM

## 2012-11-11 DIAGNOSIS — R748 Abnormal levels of other serum enzymes: Secondary | ICD-10-CM

## 2012-11-11 DIAGNOSIS — G40909 Epilepsy, unspecified, not intractable, without status epilepticus: Secondary | ICD-10-CM

## 2012-11-11 DIAGNOSIS — F341 Dysthymic disorder: Secondary | ICD-10-CM | POA: Insufficient documentation

## 2012-11-11 DIAGNOSIS — J9601 Acute respiratory failure with hypoxia: Secondary | ICD-10-CM

## 2012-11-11 DIAGNOSIS — W19XXXD Unspecified fall, subsequent encounter: Secondary | ICD-10-CM

## 2012-11-11 DIAGNOSIS — F32A Depression, unspecified: Secondary | ICD-10-CM

## 2012-11-11 DIAGNOSIS — N390 Urinary tract infection, site not specified: Secondary | ICD-10-CM

## 2012-11-11 DIAGNOSIS — G894 Chronic pain syndrome: Secondary | ICD-10-CM

## 2012-11-11 DIAGNOSIS — E46 Unspecified protein-calorie malnutrition: Secondary | ICD-10-CM

## 2012-11-11 DIAGNOSIS — W19XXXA Unspecified fall, initial encounter: Secondary | ICD-10-CM

## 2012-11-11 DIAGNOSIS — L8994 Pressure ulcer of unspecified site, stage 4: Secondary | ICD-10-CM

## 2012-11-11 DIAGNOSIS — R7881 Bacteremia: Secondary | ICD-10-CM

## 2012-11-11 DIAGNOSIS — F418 Other specified anxiety disorders: Secondary | ICD-10-CM

## 2012-11-11 DIAGNOSIS — Z1621 Resistance to vancomycin: Secondary | ICD-10-CM

## 2012-11-11 DIAGNOSIS — E871 Hypo-osmolality and hyponatremia: Secondary | ICD-10-CM

## 2012-11-11 DIAGNOSIS — M8668 Other chronic osteomyelitis, other site: Secondary | ICD-10-CM

## 2012-11-11 DIAGNOSIS — L89109 Pressure ulcer of unspecified part of back, unspecified stage: Secondary | ICD-10-CM | POA: Insufficient documentation

## 2012-11-11 DIAGNOSIS — D75839 Thrombocytosis, unspecified: Secondary | ICD-10-CM

## 2012-11-11 DIAGNOSIS — G9340 Encephalopathy, unspecified: Secondary | ICD-10-CM

## 2012-11-11 DIAGNOSIS — M25552 Pain in left hip: Secondary | ICD-10-CM

## 2012-11-11 DIAGNOSIS — R6521 Severe sepsis with septic shock: Secondary | ICD-10-CM

## 2012-11-11 DIAGNOSIS — Z89511 Acquired absence of right leg below knee: Secondary | ICD-10-CM

## 2012-11-11 DIAGNOSIS — Z89611 Acquired absence of right leg above knee: Secondary | ICD-10-CM

## 2012-11-11 DIAGNOSIS — Z933 Colostomy status: Secondary | ICD-10-CM

## 2012-11-11 DIAGNOSIS — L89314 Pressure ulcer of right buttock, stage 4: Secondary | ICD-10-CM

## 2012-11-11 DIAGNOSIS — D473 Essential (hemorrhagic) thrombocythemia: Secondary | ICD-10-CM

## 2012-11-11 DIAGNOSIS — I10 Essential (primary) hypertension: Secondary | ICD-10-CM | POA: Insufficient documentation

## 2012-11-11 DIAGNOSIS — L89154 Pressure ulcer of sacral region, stage 4: Secondary | ICD-10-CM | POA: Diagnosis present

## 2012-11-11 DIAGNOSIS — L89322 Pressure ulcer of left buttock, stage 2: Secondary | ICD-10-CM

## 2012-11-11 HISTORY — DX: Chronic pain syndrome: G89.4

## 2012-11-11 HISTORY — DX: Pressure ulcer of sacral region, stage 4: L89.154

## 2012-11-11 HISTORY — DX: Presence of urogenital implants: Z96.0

## 2012-11-11 HISTORY — DX: Pressure ulcer of right buttock, stage 4: L89.314

## 2012-11-11 HISTORY — DX: Other chronic osteomyelitis, other site: M86.68

## 2012-11-11 HISTORY — DX: Pressure ulcer of left buttock, stage 2: L89.322

## 2012-11-11 LAB — URINALYSIS, ROUTINE W REFLEX MICROSCOPIC
Glucose, UA: NEGATIVE mg/dL
Hgb urine dipstick: NEGATIVE
Protein, ur: 100 mg/dL — AB
Specific Gravity, Urine: 1.02 (ref 1.005–1.030)

## 2012-11-11 LAB — CBC WITH DIFFERENTIAL/PLATELET
Hemoglobin: 10.5 g/dL — ABNORMAL LOW (ref 13.0–17.0)
Lymphocytes Relative: 19 % (ref 12–46)
Lymphs Abs: 0.9 10*3/uL (ref 0.7–4.0)
Monocytes Relative: 5 % (ref 3–12)
Neutro Abs: 3.3 10*3/uL (ref 1.7–7.7)
Neutrophils Relative %: 73 % (ref 43–77)
Platelets: 319 10*3/uL (ref 150–400)
RBC: 3.82 MIL/uL — ABNORMAL LOW (ref 4.22–5.81)
WBC: 4.5 10*3/uL (ref 4.0–10.5)

## 2012-11-11 LAB — URINE MICROSCOPIC-ADD ON

## 2012-11-11 LAB — COMPREHENSIVE METABOLIC PANEL
ALT: 13 U/L (ref 0–53)
AST: 11 U/L (ref 0–37)
Alkaline Phosphatase: 256 U/L — ABNORMAL HIGH (ref 39–117)
CO2: 27 mEq/L (ref 19–32)
Calcium: 9 mg/dL (ref 8.4–10.5)
GFR calc Af Amer: >90 mL/min (ref >90)
GFR calc non Af Amer: >90 mL/min (ref >90)
Glucose, Bld: 99 mg/dL (ref 70–99)
Potassium: 3.5 mEq/L (ref 3.5–5.1)
Sodium: 136 mEq/L (ref 135–145)
Total Protein: 7.8 g/dL (ref 6.0–8.3)

## 2012-11-11 LAB — RAPID URINE DRUG SCREEN, HOSP PERFORMED
Amphetamines: NOT DETECTED
Cocaine: NOT DETECTED
Opiates: POSITIVE — AB
Tetrahydrocannabinol: NOT DETECTED

## 2012-11-11 MED ORDER — ALPRAZOLAM 1 MG PO TABS
1.0000 mg | ORAL_TABLET | Freq: Three times a day (TID) | ORAL | Status: DC
  Administered 2012-11-12 – 2012-11-14 (×9): 1 mg via ORAL
  Filled 2012-11-11 (×9): qty 1

## 2012-11-11 MED ORDER — PHENOBARBITAL 32.4 MG PO TABS
64.8000 mg | ORAL_TABLET | Freq: Every day | ORAL | Status: DC
  Administered 2012-11-12 – 2012-11-13 (×3): 64.8 mg via ORAL
  Filled 2012-11-11 (×3): qty 2

## 2012-11-11 MED ORDER — ZINC SULFATE 220 (50 ZN) MG PO CAPS
220.0000 mg | ORAL_CAPSULE | Freq: Every day | ORAL | Status: DC
  Administered 2012-11-12 – 2012-11-14 (×3): 220 mg via ORAL
  Filled 2012-11-11 (×3): qty 1

## 2012-11-11 MED ORDER — POLYETHYLENE GLYCOL 3350 17 G PO PACK
17.0000 g | PACK | Freq: Two times a day (BID) | ORAL | Status: DC
  Administered 2012-11-13 – 2012-11-14 (×2): 17 g via ORAL
  Filled 2012-11-11 (×4): qty 1

## 2012-11-11 MED ORDER — HYDROMORPHONE HCL PF 1 MG/ML IJ SOLN
1.0000 mg | INTRAMUSCULAR | Status: DC | PRN
  Administered 2012-11-11: 1 mg via INTRAMUSCULAR
  Filled 2012-11-11: qty 1

## 2012-11-11 MED ORDER — CEFTRIAXONE SODIUM 1 G IJ SOLR
1.0000 g | INTRAMUSCULAR | Status: DC
  Administered 2012-11-12: 1 g via INTRAVENOUS
  Filled 2012-11-11 (×2): qty 10

## 2012-11-11 MED ORDER — ENSURE COMPLETE PO LIQD
237.0000 mL | Freq: Four times a day (QID) | ORAL | Status: DC
  Administered 2012-11-12 – 2012-11-14 (×11): 237 mL via ORAL

## 2012-11-11 MED ORDER — POTASSIUM CHLORIDE IN NACL 20-0.9 MEQ/L-% IV SOLN
INTRAVENOUS | Status: DC
  Administered 2012-11-12: 03:00:00 via INTRAVENOUS

## 2012-11-11 MED ORDER — OXYCODONE HCL 5 MG PO TABS
30.0000 mg | ORAL_TABLET | ORAL | Status: DC | PRN
  Administered 2012-11-12 – 2012-11-14 (×20): 30 mg via ORAL
  Filled 2012-11-11 (×8): qty 6
  Filled 2012-11-11: qty 12
  Filled 2012-11-11 (×10): qty 6

## 2012-11-11 MED ORDER — PANTOPRAZOLE SODIUM 40 MG PO TBEC
40.0000 mg | DELAYED_RELEASE_TABLET | Freq: Every day | ORAL | Status: DC
  Administered 2012-11-12 – 2012-11-13 (×2): 40 mg via ORAL
  Filled 2012-11-11 (×2): qty 1

## 2012-11-11 MED ORDER — PHENOBARBITAL 32.4 MG PO TABS
32.4000 mg | ORAL_TABLET | Freq: Every day | ORAL | Status: DC
  Administered 2012-11-12 – 2012-11-14 (×3): 32.4 mg via ORAL
  Filled 2012-11-11 (×3): qty 1

## 2012-11-11 MED ORDER — CARISOPRODOL 350 MG PO TABS
350.0000 mg | ORAL_TABLET | Freq: Three times a day (TID) | ORAL | Status: DC
  Administered 2012-11-12 – 2012-11-14 (×8): 350 mg via ORAL
  Filled 2012-11-11 (×8): qty 1

## 2012-11-11 MED ORDER — FLEET ENEMA 7-19 GM/118ML RE ENEM: 1.0000 | ENEMA | Freq: Once | RECTAL | Status: AC | PRN

## 2012-11-11 MED ORDER — FERROUS SULFATE 325 (65 FE) MG PO TABS
325.0000 mg | ORAL_TABLET | Freq: Two times a day (BID) | ORAL | Status: DC
  Administered 2012-11-12 – 2012-11-14 (×6): 325 mg via ORAL
  Filled 2012-11-11 (×6): qty 1

## 2012-11-11 MED ORDER — ENOXAPARIN SODIUM 40 MG/0.4ML ~~LOC~~ SOLN
40.0000 mg | SUBCUTANEOUS | Status: DC
  Filled 2012-11-11 (×3): qty 0.4

## 2012-11-11 MED ORDER — ALBUTEROL SULFATE HFA 108 (90 BASE) MCG/ACT IN AERS: 2.0000 | INHALATION_SPRAY | Freq: Four times a day (QID) | RESPIRATORY_TRACT | Status: DC | PRN

## 2012-11-11 MED ORDER — DULOXETINE HCL 30 MG PO CPEP
30.0000 mg | ORAL_CAPSULE | Freq: Every evening | ORAL | Status: DC
  Administered 2012-11-12 – 2012-11-14 (×2): 30 mg via ORAL
  Filled 2012-11-11 (×3): qty 1

## 2012-11-11 MED ORDER — ONDANSETRON HCL 4 MG/2ML IJ SOLN: 4.0000 mg | INTRAMUSCULAR | Status: DC | PRN

## 2012-11-11 MED ORDER — LISINOPRIL 10 MG PO TABS
10.0000 mg | ORAL_TABLET | Freq: Every morning | ORAL | Status: DC
  Administered 2012-11-12 – 2012-11-14 (×3): 10 mg via ORAL
  Filled 2012-11-11 (×3): qty 1

## 2012-11-11 MED ORDER — ACETAMINOPHEN 325 MG PO TABS: 650.0000 mg | ORAL_TABLET | ORAL | Status: DC | PRN

## 2012-11-11 MED ORDER — ADULT MULTIVITAMIN W/MINERALS CH
1.0000 | ORAL_TABLET | Freq: Every morning | ORAL | Status: DC
  Administered 2012-11-12 – 2012-11-14 (×3): 1 via ORAL
  Filled 2012-11-11 (×3): qty 1

## 2012-11-11 MED ORDER — OXYCODONE HCL ER 80 MG PO T12A
100.0000 mg | EXTENDED_RELEASE_TABLET | Freq: Two times a day (BID) | ORAL | Status: DC
  Administered 2012-11-12 – 2012-11-14 (×6): 100 mg via ORAL
  Filled 2012-11-11: qty 5
  Filled 2012-11-11: qty 1
  Filled 2012-11-11: qty 5
  Filled 2012-11-11 (×3): qty 1

## 2012-11-11 MED ORDER — HYDROMORPHONE HCL PF 2 MG/ML IJ SOLN
2.0000 mg | Freq: Once | INTRAMUSCULAR | Status: AC
  Administered 2012-11-11: 2 mg via INTRAMUSCULAR
  Filled 2012-11-11: qty 1

## 2012-11-11 MED ORDER — HYDROMORPHONE HCL PF 1 MG/ML IJ SOLN
1.0000 mg | Freq: Once | INTRAMUSCULAR | Status: AC
  Administered 2012-11-11: 1 mg via INTRAMUSCULAR
  Filled 2012-11-11: qty 1

## 2012-11-11 MED ORDER — SORBITOL 70 % SOLN: 30.0000 mL | Freq: Every day | Status: DC | PRN

## 2012-11-11 NOTE — ED Notes (Addendum)
Pt c/o left hip pain after falling while attempting to transfer from bed to wheelchair. Pt has bilateral aka and llq colostomy,  and foley catheter in place. Pt also states he is not being taken care of by his niece and wants to go into a nursing home.

## 2012-11-11 NOTE — ED Notes (Signed)
Attempted to contact social work but they have left for day.
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
## 2012-11-12 DIAGNOSIS — G894 Chronic pain syndrome: Secondary | ICD-10-CM

## 2012-11-12 DIAGNOSIS — W19XXXA Unspecified fall, initial encounter: Secondary | ICD-10-CM

## 2012-11-12 DIAGNOSIS — D649 Anemia, unspecified: Secondary | ICD-10-CM

## 2012-11-12 LAB — CBC
HCT: 30.3 % — ABNORMAL LOW (ref 39.0–52.0)
Hemoglobin: 9.9 g/dL — ABNORMAL LOW (ref 13.0–17.0)
MCHC: 32.7 g/dL (ref 30.0–36.0)
RBC: 3.56 MIL/uL — ABNORMAL LOW (ref 4.22–5.81)

## 2012-11-12 LAB — BASIC METABOLIC PANEL
BUN: 5 mg/dL — ABNORMAL LOW (ref 6–23)
Chloride: 97 mEq/L (ref 96–112)
GFR calc Af Amer: >90 mL/min (ref >90)
GFR calc non Af Amer: >90 mL/min (ref >90)
Potassium: 3.1 mEq/L — ABNORMAL LOW (ref 3.5–5.1)
Sodium: 135 mEq/L (ref 135–145)

## 2012-11-12 LAB — URINE CULTURE

## 2012-11-12 MED ORDER — POTASSIUM CHLORIDE CRYS ER 20 MEQ PO TBCR
40.0000 meq | EXTENDED_RELEASE_TABLET | Freq: Two times a day (BID) | ORAL | Status: AC
  Administered 2012-11-12 – 2012-11-13 (×2): 40 meq via ORAL
  Filled 2012-11-12 (×2): qty 2
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
## 2012-11-12 NOTE — Plan of Care (Signed)
Problem: Phase I Progression Outcomes Goal: Voiding-avoid urinary catheter unless indicated Outcome: Not Progressing Pt has chronic foley
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
## 2012-11-12 NOTE — Progress Notes (Signed)
UR Chart Review Completed  

## 2012-11-12 NOTE — Clinical Social Work Note (Signed)
Clinical Social Work Department CLINICAL SOCIAL WORK PLACEMENT NOTE 11/12/2012  Patient:  Chris [name], Chris [name]  Account Number:  [id number] Admit date:  11/11/2012  Clinical Social Worker:  Santa Genera, CLINICAL SOCIAL WORKER  Date/time:  11/12/2012 12:00 N  Clinical Social Work is seeking post-discharge placement for this patient at the following level of care:   SKILLED NURSING   (*CSW will update this form in Epic as items are completed)   11/12/2012  Patient/family provided with [facility] Department of Clinical Social Work's list of facilities offering this level of care within the geographic area requested by the patient (or if unable, by the patient's family).  11/12/2012  Patient/family informed of their freedom to choose among providers that offer the needed level of care, that participate in Medicare, Medicaid or managed care program needed by the patient, have an available bed and are willing to accept the patient.  11/12/2012  Patient/family informed of [facility]' ownership interest in [facility], as well as of the fact that they are under no obligation to receive care at this facility.  PASARR submitted to EDS on 11/12/2012 PASARR number received from EDS on 11/12/2012  FL2 transmitted to all facilities in geographic area requested by pt/family on  11/12/2012 FL2 transmitted to all facilities within larger geographic area on   Patient informed that his/her managed care company has contracts with or will negotiate with  certain facilities, including the following:     Patient/family informed of bed offers received:   Patient chooses bed at  Physician recommends and patient chooses bed at    Patient to be transferred to  on   Patient to be transferred to facility by   The following physician request were entered in Epic:   Additional Comments: Patient has preexisting PASARR  Santa Genera, LCSW Clinical Social Worker [phone])

## 2012-11-12 NOTE — Progress Notes (Signed)
[facility] PROGRESS NOTE  PACER DORN ZOX:[id number] DOB: 02-28-1956 DOA: 11/11/2012 PCP: None  Assessment/Plan: 1. UTI: Lost IV. Change to oral antibiotics, followup culture. 2. Hypokalemia: Replete. 3. Left hip pain: Secondary to fall. No fracture. Oral pain medication. Has chronic osteomyelitis bilateral ischial tuberosities. 4. Abnormal appearance right pubic ramus on x-ray: Patient asymptomatic. Follow clinically. 5. Chronic normocytic anemia: Follow clinically. 6. Permanent colostomy: Status post treatment for colon cancer 7. History of bilateral AKA secondary to trauma 8. Chronic decubitus ulcers, chronic pain: Followup with Dr. Kelly Splinter as an outpatient desired by patient. Continue current narcotics. 9. History of MRSA bacteremia 08/2012 10. Chronic Foley catheter   Code Status: DO NOT RESUSCITATE Family Communication: None present Disposition Plan: Likely a cyst   Brendia Sacks, MD  [facility]  Pager [phone] If 7PM-7AM, please contact night-coverage at [email or web address], password [facility] 11/12/2012, 12:51 PM  LOS: 1 day   Consultants:  Wound care  Procedures:    Antibiotics:  Rocephin 4/22 >> 4/22  HPI/Subjective: Feels better except for left hip pain. Chronic Foley catheter in place. No specific urinary symptoms. Eating well and otherwise feels okay.  Objective: Filed Vitals:   11/12/12 0140 11/12/12 0455 11/12/12 0619 11/12/12 1012  BP: 116/72  99/64 123/76  Pulse: 98  79 81  Temp: 97.9 F (36.6 C)  98.1 F (36.7 C) 98.3 F (36.8 C)  TempSrc: Oral  Oral Oral  Resp: 19  19 20   Weight:  74.2 kg (163 lb 9.3 oz) 77.338 kg (170 lb 8 oz)   SpO2: 99%  97% 98%    Intake/Output Summary (Last 24 hours) at 11/12/12 1251 Last data filed at 11/12/12 1100  Gross per 24 hour  Intake    600 ml  Output   1475 ml  Net   -875 ml   Filed Weights   11/11/12 2018 11/12/12 0455 11/12/12 0619  Weight: 75.342 kg (166 lb 1.6 oz) 74.2 kg (163 lb 9.3 oz)  77.338 kg (170 lb 8 oz)    Exam:  General:  Appears calm and comfortable. Eating dinner. Cardiovascular: RRR, no m/r/g.  Respiratory: CTA bilaterally, no w/r/r. Normal respiratory effort. Abdomen: soft, ntnd Skin: no rash or induration seen left hip Psychiatric: grossly normal mood and bright affect, speech fluent and appropriate  Data Reviewed: Basic Metabolic Panel:  Recent Labs Lab 11/11/12 1627 11/12/12 0501  NA 136 135  K 3.5 3.1*  CL 97 97  CO2 27 27  GLUCOSE 99 103*  BUN 4* 5*  CREATININE 0.41* 0.43*  CALCIUM 9.0 8.7   Liver Function Tests:  Recent Labs Lab 11/11/12 1627  AST 11  ALT 13  ALKPHOS 256*  BILITOT 0.2*  PROT 7.8  ALBUMIN 2.9*   CBC:  Recent Labs Lab 11/11/12 1627 11/12/12 0501  WBC 4.5 4.1  NEUTROABS 3.3  --   HGB 10.5* 9.9*  HCT 32.3* 30.3*  MCV 84.6 85.1  PLT 319 300   Studies: Dg Chest 1 View  11/11/2012  *RADIOLOGY REPORT*  Clinical Data: Weakness  CHEST - 1 VIEW  Comparison: 09/14/2012  Findings: Port-A-Cath on the right has been removed.  Surgical clips overlying the left axilla are unchanged.  Right hemidiaphragm is elevated.  Negative for heart failure or pneumonia.  No effusion. Chronic healed fractures on the left.  IMPRESSION: Elevated right hemidiaphragm.  No acute abnormality.   Original Report Authenticated By: Janeece Riggers, M.D.    Dg Hip Complete Left  11/11/2012  *RADIOLOGY REPORT*  Clinical  Data: Left hip pain  LEFT HIP - COMPLETE 2+ VIEW  Comparison: 09/11/2007  Findings: Above-the-knee amputation on the left.  Chronic healed fracture left femoral neck which has been fixed with compression screw and rod.  No acute fracture. Progression of degenerative change in joint space narrowing in the left hip joint.  Chronic deformity   ischial tuberosity bilaterally from osteomyelitis.  There has been progression of bony destruction since the prior study.  This may be chronic and/or acute osteomyelitis.  Cortical step-off of the  superior pubic ramus on the right could be due to chronic osteomyelitis or fracture.  Further views of this are suggested if the patient has right-sided pain.  IMPRESSION: Progression of degenerative change in the left hip joint.  No acute fracture.  Chronic osteomyelitis of the ischial tuberosity bilaterally, with progression from the  prior study.  Possible fracture right superior pubic ramus.  If this area is tender, further imaging of the right hip is suggested.   Original Report Authenticated By: Janeece Riggers, M.D.     Scheduled Meds: . ALPRAZolam  1 mg Oral TID  . carisoprodol  350 mg Oral TID  . cefTRIAXone (ROCEPHIN)  IV  1 g Intravenous Q24H  . DULoxetine  30 mg Oral QPM  . enoxaparin (LOVENOX) injection  40 mg Subcutaneous Q24H  . feeding supplement  237 mL Oral QID  . ferrous sulfate  325 mg Oral BID WC  . lisinopril  10 mg Oral q morning - 10a  . multivitamin with minerals  1 tablet Oral q morning - 10a  . OxyCODONE  100 mg Oral Q12H  . pantoprazole  40 mg Oral Q1200  . phenobarbital  32.4 mg Oral Daily  . PHENobarbital  64.8 mg Oral QHS  . polyethylene glycol  17 g Oral BID  . zinc sulfate  220 mg Oral Daily   Continuous Infusions: . 0.9 % NaCl with KCl 20 mEq / L 50 mL/hr at 11/12/12 1610    Active Problems:   Chronic normocytic anemia   Depression with anxiety   S/P BKA (below knee amputation) bilateral   Colostomy status   Chronic indwelling foley catheter   PTSD (post-traumatic stress disorder)   UTI (lower urinary tract infection)   Decubitus ulcer of sacral region, stage 4   Decubitus ulcer of right buttock, stage 4   Decubitus ulcer of left buttock, stage 2   Chronic pain syndrome   Brendia Sacks, MD  [facility] Pager [phone] If 7PM-7AM, please contact night-coverage at [email or web address], password [facility] 11/12/2012, 12:51 PM  LOS: 1 day   Time spent: 20 minutes

## 2012-11-12 NOTE — Evaluation (Signed)
Physical Therapy Evaluation Patient Details Name: Chris [name] MRN: [id number] DOB: 01-06-1956 Today's Date: 11/12/2012 Time: 1191-4782 PT Time Calculation (min): 47 min  PT Assessment / Plan / Recommendation Clinical Impression  Pt was seen for evaluation.  He reports that he fell out of his w/c while trying to transfer bed to chair.  He now has a very sore L hip and has increased difficulty with transfers.  Functionally, however, he is able to transfer independently to his w/c and is essentially at prior functional level.  No further PT should be needed.  He would be well suited to ACLF.    PT Assessment  Patent does not need any further PT services    Follow Up Recommendations  No PT follow up    Does the patient have the potential to tolerate intense rehabilitation      Barriers to Discharge        Equipment Recommendations  None recommended by PT    Recommendations for Other Services     Frequency      Precautions / Restrictions Precautions Precautions: Other (comment) Precaution Comments: sacral pressure wound needs pressure relief as much as possible...on orange contact   Pertinent Vitals/Pain       Mobility  Bed Mobility Bed Mobility: Rolling Right;Rolling Left;Supine to Sit;Sitting - Scoot to Edge of Bed Rolling Right: 6: Modified independent (Device/Increase time) Rolling Left: 6: Modified independent (Device/Increase time) Supine to Sit: 6: Modified independent (Device/Increase time) Sitting - Scoot to Edge of Bed: 6: Modified independent (Device/Increase time) Transfers Transfers: Risk manager: 7: Independent Details for Transfer Assistance: pt had some left hip pain during transfer but was able to do it Ambulation/Gait Ambulation/Gait Assistance: Not tested (comment)    Exercises     PT Diagnosis:    PT Problem List:   PT Treatment Interventions:     PT Goals    Visit Information  Last PT Received  On: 11/12/12    Subjective Data  Subjective: I really bruised my L hip yesterday Patient Stated Goal: improved living situation   Prior Functioning  Home Living Lives With: Family Available Help at Discharge: Other (Comment) (previous living situation is not working out) Lawyer: Wheelchair - powered;Wheelchair - manual;Hospital bed;Bedside commode/3-in-1 Prior Function Level of Independence: Independent Able to Take Stairs?: No Driving: No Vocation: On disability Communication Communication: No difficulties    Cognition  Cognition Arousal/Alertness: Awake/alert Behavior During Therapy: WFL for tasks assessed/performed Overall Cognitive Status: Within Functional Limits for tasks assessed    Extremity/Trunk Assessment Right Upper Extremity Assessment RUE ROM/Strength/Tone: Within functional levels Left Upper Extremity Assessment LUE ROM/Strength/Tone: Within functional levels Right Lower Extremity Assessment RLE ROM/Strength/Tone:  (high AKA) Left Lower Extremity Assessment LLE ROM/Strength/Tone:  (high AKA)   Balance Balance Balance Assessed:  (WNL)  End of Session    GP Functional Assessment Tool Used: clinical judgement Functional Limitation: Changing and maintaining body position Changing and Maintaining Body Position Current Status (N5621): 0 percent impaired, limited or restricted Changing and Maintaining Body Position Goal Status (H0865): 0 percent impaired, limited or restricted Changing and Maintaining Body Position Discharge Status (H8469): 0 percent impaired, limited or restricted   Konrad Penta 11/12/2012, 3:43 PM

## 2012-11-12 NOTE — Consult Note (Signed)
WOC consult Note Reason for Consult: evaluation of chronic pressure ulcers.   Wound type: Sacrum: Stage IV pressure ulcer Right ischial: Stage III healing pressure ulcer  Pressure Ulcer POA: Yes  Measurement: Sacrum: 5.5cm x 4.0cm x 3.0cm with tunnels at 7 o'clock (5cm), 11 o'clock (3cm), 4 o'clock (5.5cm)  Right ischial: 2.5cm x 0.5cm x0.2  Wound bed: Sacrum: beefy red, some minimal slough at 12 o'clock aprox. 5% in the wound bed. No real granulation, with the epibole of the wound edges concerned that without surgical intervention will not heal. Sacrum has been present at least 5-6 yeas.     Drainage (amount, consistency, odor) serosanguinous, moderate, no odor from the sacrum; minimal serous from the right ischial.  Periwound: both intact with evidence of previous muscle flap surgery. Some scarring at the left ischial that is now closed, has defect in the buttock but no open wound.  Dressing procedure/placement/frequency: normal saline gauze to the sacrum and the right ischium, add air mattress for pressure redistribution, WC cushion if needed.   Re consult if needed, will not follow at this time. Thanks  Sharren Schnurr [employer], CWOCN [phone])

## 2012-11-13 DIAGNOSIS — M8668 Other chronic osteomyelitis, other site: Secondary | ICD-10-CM

## 2012-11-13 DIAGNOSIS — Z9889 Other specified postprocedural states: Secondary | ICD-10-CM

## 2012-11-13 DIAGNOSIS — M25552 Pain in left hip: Secondary | ICD-10-CM | POA: Diagnosis present

## 2012-11-13 LAB — BASIC METABOLIC PANEL
BUN: 8 mg/dL (ref 6–23)
CO2: 30 mEq/L (ref 19–32)
Chloride: 94 mEq/L — ABNORMAL LOW (ref 96–112)
GFR calc non Af Amer: >90 mL/min (ref >90)
Glucose, Bld: 114 mg/dL — ABNORMAL HIGH (ref 70–99)
Potassium: 3.8 mEq/L (ref 3.5–5.1)
Sodium: 133 mEq/L — ABNORMAL LOW (ref 135–145)

## 2012-11-13 MED ORDER — CEFUROXIME AXETIL 250 MG PO TABS
500.0000 mg | ORAL_TABLET | Freq: Two times a day (BID) | ORAL | Status: DC
  Administered 2012-11-13 – 2012-11-14 (×3): 500 mg via ORAL
  Filled 2012-11-13 (×3): qty 2

## 2012-11-13 NOTE — Clinical Social Work Placement (Deleted)
    Clinical Social Work Department CLINICAL SOCIAL WORK PLACEMENT NOTE 11/13/2012  Patient:  Chris [name], Chris [name]  Account Number:  [id number] Admit date:  11/11/2012  Clinical Social Worker:  Santa Genera, CLINICAL SOCIAL WORKER  Date/time:  11/12/2012 12:00 N  Clinical Social Work is seeking post-discharge placement for this patient at the following level of care:   SKILLED NURSING   (*CSW will update this form in Epic as items are completed)   11/12/2012  Patient/family provided with [facility] Department of Clinical Social Work's list of facilities offering this level of care within the geographic area requested by the patient (or if unable, by the patient's family).  11/12/2012  Patient/family informed of their freedom to choose among providers that offer the needed level of care, that participate in Medicare, Medicaid or managed care program needed by the patient, have an available bed and are willing to accept the patient.  11/12/2012  Patient/family informed of [facility]' ownership interest in [facility], as well as of the fact that they are under no obligation to receive care at this facility.  PASARR submitted to EDS on 11/12/2012 PASARR number received from EDS on 11/12/2012  FL2 transmitted to all facilities in geographic area requested by pt/family on  11/12/2012 FL2 transmitted to all facilities within larger geographic area on   Patient informed that his/her managed care company has contracts with or will negotiate with  certain facilities, including the following:     Patient/family informed of bed offers received:  11/13/2012 Patient chooses bed at [facility] [place] Physician recommends and patient chooses bed at    Patient to be transferred to  on   Patient to be transferred to facility by   The following physician request were entered in Epic:   Additional Comments: Patient has preexisting PASARR  Santa Genera, LCSW Clinical  Social Worker [phone])

## 2012-11-13 NOTE — Care Management Note (Signed)
    Page 1 of 1   11/14/2012     2:26:27 PM   CARE MANAGEMENT NOTE 11/14/2012  Patient:  LUISCARLOS, KACZMARCZYK   Account Number:  [id number]  Date Initiated:  11/13/2012  Documentation initiated by:  Rosemary Holms  Subjective/Objective Assessment:   Pt admitted from home where he has been with Niece. Pt does not want to return to home but wants to be placed. CSW working on disposition     Action/Plan:   Anticipated DC Date:  11/14/2012   Anticipated DC Plan:  SKILLED NURSING FACILITY  In-house referral  Clinical Social Worker      DC Planning Services  CM consult      Choice offered to / List presented to:             Status of service:  Completed, signed off Medicare Important Message given?   (If response is "NO", the following Medicare IM given date fields will be blank) Date Medicare IM given:   Date Additional Medicare IM given:    Discharge Disposition:  SKILLED NURSING FACILITY  Per UR Regulation:    If discussed at Long Length of Stay Meetings, dates discussed:    Comments:  11/14/12 Rosemary Holms RN BSN CM To f/u with wound care facility once DC'd per MD  11/13/12 Rosemary Holms RN BSN CM

## 2012-11-13 NOTE — Progress Notes (Signed)
Subjective: Patient continues to have some intermittent left hip pain and low back pain. No pain within the urethra or with urination.  Objective: Vital signs in last 24 hours: Filed Vitals:   11/12/12 1754 11/12/12 2138 11/13/12 0717 11/13/12 1057  BP: 99/65 147/81  110/74  Pulse: 82 91 79 76  Temp: 98.2 F (36.8 C) 98.5 F (36.9 C)  97.3 F (36.3 C)  TempSrc: Oral Oral  Oral  Resp:  18  18  Weight:      SpO2: 98% 97% 94% 98%    Intake/Output Summary (Last 24 hours) at 11/13/12 1505 Last data filed at 11/13/12 1300  Gross per 24 hour  Intake    440 ml  Output   3550 ml  Net  -3110 ml    Weight change:   Physical exam: General: Alert 57 year old bilateral amputee laying in bed, in no acute distress. Lungs: Clear anteriorly with decreased breath sounds in the bases. Heart: S1, S2, with a soft systolic murmur. Abdomen: Left-sided ostomy draining soft brown stool. Positive bowel sounds, soft, mildly tender over the hypogastrium and suprapubic area. No rebound or distention or masses palpated. Extremities: Bilateral lower extremity amputee with bilateral edema, right greater than left. Sacrum: Bandage is in place. Will examine at dressing change.  Lab Results: Basic Metabolic Panel:  Recent Labs  16/10/96 0501 11/13/12 0500  NA 135 133*  K 3.1* 3.8  CL 97 94*  CO2 27 30  GLUCOSE 103* 114*  BUN 5* 8  CREATININE 0.43* 0.47*  CALCIUM 8.7 8.7   Liver Function Tests:  Recent Labs  11/11/12 1627  AST 11  ALT 13  ALKPHOS 256*  BILITOT 0.2*  PROT 7.8  ALBUMIN 2.9*   No results found for this basename: LIPASE, AMYLASE,  in the last 72 hours No results found for this basename: AMMONIA,  in the last 72 hours CBC:  Recent Labs  11/11/12 1627 11/12/12 0501  WBC 4.5 4.1  NEUTROABS 3.3  --   HGB 10.5* 9.9*  HCT 32.3* 30.3*  MCV 84.6 85.1  PLT 319 300   Cardiac Enzymes: No results found for this basename: CKTOTAL, CKMB, CKMBINDEX, TROPONINI,  in the last  72 hours BNP: No results found for this basename: PROBNP,  in the last 72 hours D-Dimer: No results found for this basename: DDIMER,  in the last 72 hours CBG: No results found for this basename: GLUCAP,  in the last 72 hours Hemoglobin A1C: No results found for this basename: HGBA1C,  in the last 72 hours Fasting Lipid Panel: No results found for this basename: CHOL, HDL, LDLCALC, TRIG, CHOLHDL, LDLDIRECT,  in the last 72 hours Thyroid Function Tests: No results found for this basename: TSH, T4TOTAL, FREET4, T3FREE, THYROIDAB,  in the last 72 hours Anemia Panel: No results found for this basename: VITAMINB12, FOLATE, FERRITIN, TIBC, IRON, RETICCTPCT,  in the last 72 hours Coagulation: No results found for this basename: LABPROT, INR,  in the last 72 hours Urine Drug Screen: Drugs of Abuse     Component Value Date/Time   LABOPIA POSITIVE* 11/11/2012 2230   COCAINSCRNUR NONE DETECTED 11/11/2012 2230   LABBENZ NONE DETECTED 11/11/2012 2230   AMPHETMU NONE DETECTED 11/11/2012 2230   THCU NONE DETECTED 11/11/2012 2230   LABBARB POSITIVE* 11/11/2012 2230    Alcohol Level: No results found for this basename: ETH,  in the last 72 hours Urinalysis:  Recent Labs  11/11/12 1655  COLORURINE AMBER*  LABSPEC 1.020  PHURINE 6.5  GLUCOSEU NEGATIVE  HGBUR NEGATIVE  BILIRUBINUR NEGATIVE  KETONESUR NEGATIVE  PROTEINUR 100*  UROBILINOGEN 1.0  NITRITE NEGATIVE  LEUKOCYTESUR SMALL*   Misc. Labs:   Micro: Recent Results (from the past 240 hour(s))  URINE CULTURE     Status: None   Collection Time    11/11/12  4:50 PM      Result Value Range Status   Specimen Description URINE, CLEAN CATCH   Final   Special Requests NONE   Final   Culture  Setup Time 11/12/2012 01:59   Final   Colony Count NO GROWTH   Final   Culture NO GROWTH   Final   Report Status 11/12/2012 FINAL   Final    Studies/Results: Dg Chest 1 View  11/11/2012  *RADIOLOGY REPORT*  Clinical Data: Weakness  CHEST - 1  VIEW  Comparison: 09/14/2012  Findings: Port-A-Cath on the right has been removed.  Surgical clips overlying the left axilla are unchanged.  Right hemidiaphragm is elevated.  Negative for heart failure or pneumonia.  No effusion. Chronic healed fractures on the left.  IMPRESSION: Elevated right hemidiaphragm.  No acute abnormality.   Original Report Authenticated By: Janeece Riggers, M.D.    Dg Hip Complete Left  11/11/2012  *RADIOLOGY REPORT*  Clinical Data: Left hip pain  LEFT HIP - COMPLETE 2+ VIEW  Comparison: 09/11/2007  Findings: Above-the-knee amputation on the left.  Chronic healed fracture left femoral neck which has been fixed with compression screw and rod.  No acute fracture. Progression of degenerative change in joint space narrowing in the left hip joint.  Chronic deformity   ischial tuberosity bilaterally from osteomyelitis.  There has been progression of bony destruction since the prior study.  This may be chronic and/or acute osteomyelitis.  Cortical step-off of the superior pubic ramus on the right could be due to chronic osteomyelitis or fracture.  Further views of this are suggested if the patient has right-sided pain.  IMPRESSION: Progression of degenerative change in the left hip joint.  No acute fracture.  Chronic osteomyelitis of the ischial tuberosity bilaterally, with progression from the  prior study.  Possible fracture right superior pubic ramus.  If this area is tender, further imaging of the right hip is suggested.   Original Report Authenticated By: Janeece Riggers, M.D.     Medications:  Scheduled: . ALPRAZolam  1 mg Oral TID  . carisoprodol  350 mg Oral TID  . DULoxetine  30 mg Oral QPM  . enoxaparin (LOVENOX) injection  40 mg Subcutaneous Q24H  . feeding supplement  237 mL Oral QID  . ferrous sulfate  325 mg Oral BID WC  . lisinopril  10 mg Oral q morning - 10a  . multivitamin with minerals  1 tablet Oral q morning - 10a  . OxyCODONE  100 mg Oral Q12H  . pantoprazole  40 mg  Oral Q1200  . phenobarbital  32.4 mg Oral Daily  . PHENobarbital  64.8 mg Oral QHS  . polyethylene glycol  17 g Oral BID  . zinc sulfate  220 mg Oral Daily   Continuous:  ZOX:[id number], albuterol, ondansetron (ZOFRAN) IV, oxycodone, sorbitol  Assessment: Principal Problem:   Left hip pain Active Problems:   Chronic normocytic anemia   Depression with anxiety   S/P BKA (below knee amputation) bilateral   Colostomy status   Chronic indwelling foley catheter   PTSD (post-traumatic stress disorder)   UTI (lower urinary tract infection)   Decubitus ulcer of sacral region, stage 4  Decubitus ulcer of right buttock, stage 4   Decubitus ulcer of left buttock, stage 2   Chronic pain syndrome   1. Urinary tract infection in the setting of chronic indwelling Foley catheter. Urine culture surprisingly negative. IV lost. Will start oral antibiotic.   Status post fall resulting in left hip pain. Improving with pain medication.  Question right pubic rami fracture. We'll not image any further as the patient is virtually bedridden and and a bilateral amputee.  Chronic multiple decubitus ulcers of the sacrum/buttocks. Wound care nurse assessment and recommendations noted and appreciated. Continue wound care. Ultimately, he will need long-term followup with a wound care physician and/or plastic surgeon.  Hypokalemia. Repleted/supplemented.    Plan:  1. Start Ceftin orally twice a day. 2. Pursue skilled nursing facility placement secondary to multiple sacral wounds. 3. Ultimate referral to wound care clinic and/or plastic surgeon.   LOS: 2 days   Aasia Peavler 11/13/2012, 3:05 PM

## 2012-11-13 NOTE — Clinical Social Work Note (Signed)
Patient given bed offers from [facility], [facility] and [address] [facility].  Chooses [facility], facility notified of patient choice and remain willing to accept patient at discharge.  Patient gave CSW contact information for his sister, patient is calling sister to ask her to complete his preadmission paperwork at facility.    Santa Genera, LCSW Clinical Social Worker [phone])

## 2012-11-14 ENCOUNTER — Encounter (HOSPITAL_COMMUNITY): Payer: Self-pay | Admitting: Internal Medicine

## 2012-11-14 DIAGNOSIS — M8668 Other chronic osteomyelitis, other site: Secondary | ICD-10-CM

## 2012-11-14 HISTORY — DX: Other chronic osteomyelitis, other site: M86.68

## 2012-11-14 MED ORDER — DULOXETINE HCL 30 MG PO CPEP: 30.0000 mg | ORAL_CAPSULE | Freq: Every evening | ORAL | Status: AC

## 2012-11-14 MED ORDER — CARISOPRODOL 350 MG PO TABS: 350.0000 mg | ORAL_TABLET | Freq: Three times a day (TID) | ORAL | Status: DC

## 2012-11-14 MED ORDER — ALPRAZOLAM 1 MG PO TABS: 1.0000 mg | ORAL_TABLET | Freq: Three times a day (TID) | ORAL | Status: DC

## 2012-11-14 MED ORDER — CEFUROXIME AXETIL 500 MG PO TABS: 500.0000 mg | ORAL_TABLET | Freq: Two times a day (BID) | ORAL | Status: DC

## 2012-11-14 MED ORDER — POLYETHYLENE GLYCOL 3350 17 G PO PACK: 17.0000 g | PACK | Freq: Every day | ORAL | Status: DC

## 2012-11-14 MED ORDER — OXYCODONE HCL ER 20 MG PO T12A: 100.0000 mg | EXTENDED_RELEASE_TABLET | Freq: Two times a day (BID) | ORAL | Status: DC

## 2012-11-14 MED ORDER — OXYCODONE HCL 30 MG PO TABS: 30.0000 mg | ORAL_TABLET | ORAL | Status: DC | PRN

## 2012-11-14 MED ORDER — SORBITOL 70 % SOLN: 30.0000 mL | Freq: Every day | Status: DC | PRN

## 2012-11-14 NOTE — Clinical Social Work Placement (Signed)
    Clinical Social Work Department CLINICAL SOCIAL WORK PLACEMENT NOTE 11/14/2012  Patient:  Chris [name], Chris [name]  Account Number:  [id number] Admit date:  11/11/2012  Clinical Social Worker:  Santa Genera, CLINICAL SOCIAL WORKER  Date/time:  11/12/2012 12:00 N  Clinical Social Work is seeking post-discharge placement for this patient at the following level of care:   SKILLED NURSING   (*CSW will update this form in Epic as items are completed)   11/12/2012  Patient/family provided with [facility] Department of Clinical Social Work's list of facilities offering this level of care within the geographic area requested by the patient (or if unable, by the patient's family).  11/12/2012  Patient/family informed of their freedom to choose among providers that offer the needed level of care, that participate in Medicare, Medicaid or managed care program needed by the patient, have an available bed and are willing to accept the patient.  11/12/2012  Patient/family informed of [facility]' ownership interest in [facility], as well as of the fact that they are under no obligation to receive care at this facility.  PASARR submitted to EDS on 11/12/2012 PASARR number received from [employer] on 11/12/2012  FL2 transmitted to all facilities in geographic area requested by pt/family on  11/12/2012 FL2 transmitted to all facilities within larger geographic area on   Patient informed that his/her managed care company has contracts with or will negotiate with  certain facilities, including the following:     Patient/family informed of bed offers received:  11/13/2012 Patient chooses bed at [facility] [place] Physician recommends and patient chooses bed at    Patient to be transferred to [facility] on  11/14/2012 Patient to be transferred to facility by [facility]  The following physician request were entered in Epic:   Additional Comments: Patient  has preexisting PASARR  Santa Genera, LCSW Clinical Social Worker [phone])

## 2012-11-14 NOTE — Clinical Social Work Note (Signed)
Patient ready for discharge today and transfer to [facility] SNF.  Facility aware and agreeable, discharge summary faxed via [facility].  FL2 reviewed w RN and updated as needed.  Patient aware of transfer and agreeable, patient asked that sister be contacted and asked to sign preadmission paperwork at [facility].  CSW spoke w sister who agreed to assist and has already been at [facility].  Patient will be transported via EMS due to extensive stage IV pressure ulcers which preclude sitting for any period of time.  [facility] worker aware of discharge plans and says she will contact facility to offer assistance.  Medicaid prior approval initiated and paperwork given to SNF.  CSW signing off as no further SW needs identified at this time.  Santa Genera, LCSW Clinical Social Worker [phone])

## 2012-11-14 NOTE — Progress Notes (Signed)
Report called to Lawyer at [facility]
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
## 2012-11-14 NOTE — Plan of Care (Signed)
Problem: Phase II Progression Outcomes Goal: IV changed to normal saline lock Outcome: Completed/Met Date Met:  11/14/12 Removed

## 2012-11-14 NOTE — Plan of Care (Signed)
Problem: Phase III Progression Outcomes Goal: Voiding independently Outcome: Not Applicable Date Met:  11/14/12 Foley to protect sacral wound Goal: Foley discontinued Outcome: Not Applicable Date Met:  11/14/12 To protect sacral wound Goal: Discharge plan remains appropriate-arrangements made Outcome: Completed/Met Date Met:  11/14/12 [facility]  Problem: Discharge Progression Outcomes Goal: Other Discharge Outcomes/Goals Outcome: Completed/Met Date Met:  11/14/12 Transferred via EMS

## 2012-11-18 ENCOUNTER — Other Ambulatory Visit: Payer: Self-pay | Admitting: *Deleted

## 2012-11-18 MED ORDER — PHENOBARBITAL 64.8 MG PO TABS: ORAL_TABLET | ORAL | Status: DC

## 2012-11-19 ENCOUNTER — Other Ambulatory Visit: Payer: Self-pay | Admitting: *Deleted

## 2012-11-19 MED ORDER — OXYCODONE HCL 30 MG PO TABS: ORAL_TABLET | ORAL | Status: DC

## 2012-11-22 ENCOUNTER — Encounter: Payer: Self-pay | Admitting: Internal Medicine

## 2012-11-22 ENCOUNTER — Non-Acute Institutional Stay (SKILLED_NURSING_FACILITY): Payer: Medicaid Other | Admitting: Internal Medicine

## 2012-11-22 DIAGNOSIS — L89314 Pressure ulcer of right buttock, stage 4: Secondary | ICD-10-CM

## 2012-11-22 DIAGNOSIS — M25559 Pain in unspecified hip: Secondary | ICD-10-CM

## 2012-11-22 DIAGNOSIS — L8994 Pressure ulcer of unspecified site, stage 4: Secondary | ICD-10-CM

## 2012-11-22 DIAGNOSIS — W19XXXS Unspecified fall, sequela: Secondary | ICD-10-CM

## 2012-11-22 DIAGNOSIS — D649 Anemia, unspecified: Secondary | ICD-10-CM

## 2012-11-22 DIAGNOSIS — M25552 Pain in left hip: Secondary | ICD-10-CM

## 2012-11-22 DIAGNOSIS — Z933 Colostomy status: Secondary | ICD-10-CM

## 2012-11-22 DIAGNOSIS — F418 Other specified anxiety disorders: Secondary | ICD-10-CM

## 2012-11-22 DIAGNOSIS — L89154 Pressure ulcer of sacral region, stage 4: Secondary | ICD-10-CM

## 2012-11-22 DIAGNOSIS — L89309 Pressure ulcer of unspecified buttock, unspecified stage: Secondary | ICD-10-CM

## 2012-11-22 DIAGNOSIS — F341 Dysthymic disorder: Secondary | ICD-10-CM

## 2012-11-22 DIAGNOSIS — G894 Chronic pain syndrome: Secondary | ICD-10-CM

## 2012-11-22 DIAGNOSIS — L89322 Pressure ulcer of left buttock, stage 2: Secondary | ICD-10-CM

## 2012-11-22 DIAGNOSIS — F4310 Post-traumatic stress disorder, unspecified: Secondary | ICD-10-CM

## 2012-11-22 DIAGNOSIS — L89109 Pressure ulcer of unspecified part of back, unspecified stage: Secondary | ICD-10-CM

## 2012-11-22 DIAGNOSIS — Z9889 Other specified postprocedural states: Secondary | ICD-10-CM

## 2012-11-22 DIAGNOSIS — L8991 Pressure ulcer of unspecified site, stage 1: Secondary | ICD-10-CM

## 2012-11-22 DIAGNOSIS — Z978 Presence of other specified devices: Secondary | ICD-10-CM
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
## 2012-11-26 ENCOUNTER — Other Ambulatory Visit: Payer: Self-pay | Admitting: Geriatric Medicine

## 2012-11-26 MED ORDER — CARISOPRODOL 350 MG PO TABS: 350.0000 mg | ORAL_TABLET | Freq: Three times a day (TID) | ORAL | Status: DC

## 2012-11-28 ENCOUNTER — Encounter (HOSPITAL_BASED_OUTPATIENT_CLINIC_OR_DEPARTMENT_OTHER): Payer: Medicaid Other | Attending: Internal Medicine

## 2012-11-29 ENCOUNTER — Other Ambulatory Visit: Payer: Self-pay | Admitting: *Deleted

## 2012-11-29 MED ORDER — ALPRAZOLAM 1 MG PO TABS: ORAL_TABLET | ORAL | Status: DC

## 2012-12-02 ENCOUNTER — Encounter: Payer: Medicaid Other | Admitting: Internal Medicine

## 2012-12-19 ENCOUNTER — Other Ambulatory Visit: Payer: Self-pay | Admitting: Geriatric Medicine

## 2012-12-19 MED ORDER — OXYCODONE HCL ER 20 MG PO T12A: EXTENDED_RELEASE_TABLET | ORAL | Status: DC

## 2012-12-23 ENCOUNTER — Other Ambulatory Visit: Payer: Self-pay | Admitting: *Deleted

## 2012-12-23 MED ORDER — OXYCODONE-ACETAMINOPHEN 10-325 MG PO TABS: ORAL_TABLET | ORAL | Status: DC

## 2012-12-27 ENCOUNTER — Other Ambulatory Visit: Payer: Self-pay | Admitting: *Deleted

## 2012-12-27 MED ORDER — PHENOBARBITAL 32.4 MG PO TABS: ORAL_TABLET | ORAL | Status: DC

## 2012-12-30 ENCOUNTER — Other Ambulatory Visit: Payer: Self-pay | Admitting: *Deleted

## 2012-12-30 MED ORDER — OXYCODONE HCL 30 MG PO TABS: ORAL_TABLET | ORAL | Status: DC

## 2013-01-01 ENCOUNTER — Non-Acute Institutional Stay (SKILLED_NURSING_FACILITY): Payer: Medicaid Other | Admitting: Internal Medicine

## 2013-01-01 DIAGNOSIS — F418 Other specified anxiety disorders: Secondary | ICD-10-CM

## 2013-01-01 DIAGNOSIS — R079 Chest pain, unspecified: Secondary | ICD-10-CM

## 2013-01-01 DIAGNOSIS — G894 Chronic pain syndrome: Secondary | ICD-10-CM

## 2013-01-01 DIAGNOSIS — F341 Dysthymic disorder: Secondary | ICD-10-CM

## 2013-01-01 NOTE — Progress Notes (Signed)
Patient ID: Chris [name], male   DOB: 02/08/1956, 57 y.o.   MRN: [id number]  Chief Complaint: chest pain last night, medical mgt chronic diseases  HPI:  57 yo male here for short term rehab was seen due to acute episode of chest pain last night.  Says it had resolved before EKG done.  Feels ok now.  Wants to go home.  No palpitations, no increase in dyspnea.    Review of Systems:  Review of Systems  Constitutional: Negative for fever.  Respiratory: Positive for shortness of breath. Negative for cough and wheezing.   Cardiovascular: Positive for chest pain. Negative for palpitations, orthopnea, leg swelling and PND.  Gastrointestinal: Negative for abdominal pain.  Musculoskeletal: Negative for falls.  Neurological: Positive for sensory change. Negative for headaches.     Medications: Patient's Medications  New Prescriptions   No medications on file  Previous Medications   ALBUTEROL (PROVENTIL HFA;VENTOLIN HFA) 108 (90 BASE) MCG/ACT INHALER    Inhale 2 puffs into the lungs every 6 (six) hours as needed for wheezing.   ALPRAZOLAM (XANAX) 1 MG TABLET    Take one tablet three times a day   CARISOPRODOL (SOMA) 350 MG TABLET    Take 1 tablet (350 mg total) by mouth 3 (three) times daily.   CEFUROXIME (CEFTIN) 500 MG TABLET    Take 1 tablet (500 mg total) by mouth 2 (two) times daily with a meal. Antibiotic for 5 more days.   DULOXETINE (CYMBALTA) 30 MG CAPSULE    Take 1 capsule (30 mg total) by mouth every evening.   FEEDING SUPPLEMENT (ENSURE COMPLETE) LIQD    Take 237 mLs by mouth 4 (four) times daily.   FERROUS SULFATE 325 (65 FE) MG TABLET    Take 1 tablet (325 mg total) by mouth 2 (two) times daily with a meal.   LISINOPRIL (PRINIVIL,ZESTRIL) 10 MG TABLET    Take 10 mg by mouth every morning.    MULTIPLE VITAMIN (MULTIVITAMIN WITH MINERALS) TABS    Take 1 tablet by mouth every morning.   OXYCODONE (OXYCONTIN) 20 MG T12A    Take five tablets by mouth twice daily.   OXYCODONE  (ROXICODONE) 30 MG IMMEDIATE RELEASE TABLET    Take two  tablet every 3 hours as needed for breakthrough pain   OXYCODONE (ROXICODONE) 30 MG IMMEDIATE RELEASE TABLET    Take 2 tablets every 3 hours as needed for pain   OXYCODONE-ACETAMINOPHEN (PERCOCET) 10-325 MG PER TABLET    Take one tablet by mouth every 4 hours as needed for pain   PANTOPRAZOLE (PROTONIX) 40 MG TABLET    Take 1 tablet (40 mg total) by mouth daily at 12 noon.   PHENOBARBITAL (LUMINAL) 32.4 MG TABLET    Take 32.4-64.8 mg by mouth 2 (two) times daily. Take 1 tablet in the morning and 2 tablets at in the evening   PHENOBARBITAL (LUMINAL) 32.4 MG TABLET    Take one tablet by mouth every morning; Take two tablets by mouth every night   PHENOBARBITAL (LUMINAL) 64.8 MG TABLET    Take one tablet every evening   POLYETHYLENE GLYCOL (MIRALAX / GLYCOLAX) PACKET    Take 17 g by mouth daily.   SORBITOL 70 % SOLN    Take 30 mLs by mouth daily as needed.   ZINC SULFATE (ZINC-220) 220 MG CAPSULE    Take 220 mg by mouth daily.  Modified Medications   No medications on file  Discontinued Medications   No medications  on file     Physical Exam: Physical Exam  Nursing note and vitals reviewed. Cardiovascular: Normal rate, regular rhythm and normal heart sounds.   Pulmonary/Chest: Effort normal and breath sounds normal. No respiratory distress.  Abdominal: Soft. Bowel sounds are normal. He exhibits no distension. There is no tenderness.  Musculoskeletal:  No reproducible chest tenderness  Neurological: He is alert.    Labs reviewed: Basic Metabolic Panel:  Recent Labs  21/30/86 0444  11/11/12 1627 11/12/12 0501 11/13/12 0500  NA 131*  < > 136 135 133*  K 3.6  < > 3.5 3.1* 3.8  CL 93*  < > 97 97 94*  CO2 29  < > 27 27 30   GLUCOSE 119*  < > 99 103* 114*  BUN 10  < > 4* 5* 8  CREATININE 0.76  < > 0.41* 0.43* 0.47*  CALCIUM 8.3*  < > 9.0 8.7 8.7  MG 2.1  --   --   --   --   PHOS 3.6  --   --   --   --   < > = values in this  interval not displayed.  Liver Function Tests:  Recent Labs  09/14/12 1526 09/15/12 0735 11/11/12 1627  AST 30 45* 11  ALT 25 26 13   ALKPHOS 218* 218* 256*  BILITOT 1.1 1.0 0.2*  PROT 6.9 5.7* 7.8  ALBUMIN 1.6* 1.4* 2.9*    CBC:  Recent Labs  08/25/12 1640  09/14/12 0120  09/20/12 0540 11/11/12 1627 11/12/12 0501  WBC 5.1  < > 8.7  < > 6.9 4.5 4.1  NEUTROABS 4.3  --  7.7  --   --  3.3  --   HGB 8.8*  < > 9.7*  < > 7.7* 10.5* 9.9*  HCT 25.7*  < > 29.4*  < > 23.9* 32.3* 30.3*  MCV 87.4  < > 84.5  < > 86.3 84.6 85.1  PLT 203  < > 268  < > 295 319 300  < > = values in this interval not displayed.  Significant Diagnostic Results: EKG 12/31/12:  Low voltage, normal sinus rhythm, incomplete RBBB, rate 79  Assessment/Plan 1. Chest pain -EKG was unremarkable and pt's symptoms resolved within minutes -likely more respiratory  2. Chronic pain syndrome -pain medications did not require changing today  3. Depression with anxiety -adamant that he will go home soon.   -cont cymbalta for mood and pain

## 2013-01-03 ENCOUNTER — Other Ambulatory Visit: Payer: Self-pay | Admitting: Geriatric Medicine

## 2013-01-03 MED ORDER — OXYCODONE HCL 30 MG PO TABS: ORAL_TABLET | ORAL | Status: DC

## 2013-01-06 ENCOUNTER — Other Ambulatory Visit: Payer: Self-pay | Admitting: Geriatric Medicine

## 2013-01-06 MED ORDER — OXYCODONE HCL 30 MG PO TABS: ORAL_TABLET | ORAL | Status: DC

## 2013-01-14 ENCOUNTER — Other Ambulatory Visit: Payer: Self-pay | Admitting: *Deleted

## 2013-01-14 MED ORDER — OXYCODONE HCL 20 MG PO TB12: ORAL_TABLET | ORAL | Status: DC

## 2013-01-22 ENCOUNTER — Encounter: Payer: Self-pay | Admitting: Internal Medicine
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
## 2013-01-28 ENCOUNTER — Non-Acute Institutional Stay (SKILLED_NURSING_FACILITY): Payer: Medicaid Other | Admitting: Adult Health

## 2013-01-28 DIAGNOSIS — G894 Chronic pain syndrome: Secondary | ICD-10-CM

## 2013-01-28 DIAGNOSIS — L89154 Pressure ulcer of sacral region, stage 4: Secondary | ICD-10-CM

## 2013-01-28 DIAGNOSIS — G40909 Epilepsy, unspecified, not intractable, without status epilepticus: Secondary | ICD-10-CM

## 2013-01-28 DIAGNOSIS — L8990 Pressure ulcer of unspecified site, unspecified stage: Secondary | ICD-10-CM

## 2013-01-28 DIAGNOSIS — L8994 Pressure ulcer of unspecified site, stage 4: Secondary | ICD-10-CM

## 2013-01-28 DIAGNOSIS — L89109 Pressure ulcer of unspecified part of back, unspecified stage: Secondary | ICD-10-CM

## 2013-01-30 DIAGNOSIS — L89109 Pressure ulcer of unspecified part of back, unspecified stage: Secondary | ICD-10-CM

## 2013-01-30 DIAGNOSIS — L8994 Pressure ulcer of unspecified site, stage 4: Secondary | ICD-10-CM

## 2013-01-30 DIAGNOSIS — D649 Anemia, unspecified: Secondary | ICD-10-CM

## 2013-01-30 DIAGNOSIS — C189 Malignant neoplasm of colon, unspecified: Secondary | ICD-10-CM

## 2013-02-06 ENCOUNTER — Encounter (HOSPITAL_COMMUNITY): Payer: Self-pay

## 2013-02-06 ENCOUNTER — Emergency Department (HOSPITAL_COMMUNITY): Payer: Medicaid Other

## 2013-02-06 ENCOUNTER — Inpatient Hospital Stay (HOSPITAL_COMMUNITY)
Admission: EM | Admit: 2013-02-06 | Discharge: 2013-02-25 | DRG: 539 | Disposition: A | Discharge status: Skilled Nursing Facility | Payer: Medicaid Other | Attending: Internal Medicine | Admitting: Internal Medicine

## 2013-02-06 DIAGNOSIS — L89309 Pressure ulcer of unspecified buttock, unspecified stage: Secondary | ICD-10-CM | POA: Diagnosis present

## 2013-02-06 DIAGNOSIS — T847XXD Infection and inflammatory reaction due to other internal orthopedic prosthetic devices, implants and grafts, subsequent encounter: Secondary | ICD-10-CM

## 2013-02-06 DIAGNOSIS — Z85038 Personal history of other malignant neoplasm of large intestine: Secondary | ICD-10-CM

## 2013-02-06 DIAGNOSIS — W19XXXA Unspecified fall, initial encounter: Secondary | ICD-10-CM

## 2013-02-06 DIAGNOSIS — M86652 Other chronic osteomyelitis, left thigh: Secondary | ICD-10-CM

## 2013-02-06 DIAGNOSIS — G894 Chronic pain syndrome: Secondary | ICD-10-CM

## 2013-02-06 DIAGNOSIS — J9601 Acute respiratory failure with hypoxia: Secondary | ICD-10-CM

## 2013-02-06 DIAGNOSIS — M25452 Effusion, left hip: Secondary | ICD-10-CM

## 2013-02-06 DIAGNOSIS — F19939 Other psychoactive substance use, unspecified with withdrawal, unspecified: Secondary | ICD-10-CM | POA: Diagnosis present

## 2013-02-06 DIAGNOSIS — F17200 Nicotine dependence, unspecified, uncomplicated: Secondary | ICD-10-CM | POA: Diagnosis present

## 2013-02-06 DIAGNOSIS — Z79899 Other long term (current) drug therapy: Secondary | ICD-10-CM

## 2013-02-06 DIAGNOSIS — F418 Other specified anxiety disorders: Secondary | ICD-10-CM

## 2013-02-06 DIAGNOSIS — M25459 Effusion, unspecified hip: Secondary | ICD-10-CM | POA: Diagnosis present

## 2013-02-06 DIAGNOSIS — E669 Obesity, unspecified: Secondary | ICD-10-CM | POA: Diagnosis present

## 2013-02-06 DIAGNOSIS — Z89512 Acquired absence of left leg below knee: Secondary | ICD-10-CM

## 2013-02-06 DIAGNOSIS — Z113 Encounter for screening for infections with a predominantly sexual mode of transmission: Secondary | ICD-10-CM

## 2013-02-06 DIAGNOSIS — Z933 Colostomy status: Secondary | ICD-10-CM

## 2013-02-06 DIAGNOSIS — L89322 Pressure ulcer of left buttock, stage 2: Secondary | ICD-10-CM

## 2013-02-06 DIAGNOSIS — J449 Chronic obstructive pulmonary disease, unspecified: Secondary | ICD-10-CM | POA: Diagnosis present

## 2013-02-06 DIAGNOSIS — S7002XA Contusion of left hip, initial encounter: Secondary | ICD-10-CM

## 2013-02-06 DIAGNOSIS — L89109 Pressure ulcer of unspecified part of back, unspecified stage: Secondary | ICD-10-CM | POA: Diagnosis present

## 2013-02-06 DIAGNOSIS — T8450XS Infection and inflammatory reaction due to unspecified internal joint prosthesis, sequela: Secondary | ICD-10-CM

## 2013-02-06 DIAGNOSIS — Z8614 Personal history of Methicillin resistant Staphylococcus aureus infection: Secondary | ICD-10-CM

## 2013-02-06 DIAGNOSIS — T8459XD Infection and inflammatory reaction due to other internal joint prosthesis, subsequent encounter: Secondary | ICD-10-CM

## 2013-02-06 DIAGNOSIS — B1920 Unspecified viral hepatitis C without hepatic coma: Secondary | ICD-10-CM

## 2013-02-06 DIAGNOSIS — E876 Hypokalemia: Secondary | ICD-10-CM | POA: Diagnosis present

## 2013-02-06 DIAGNOSIS — W19XXXS Unspecified fall, sequela: Secondary | ICD-10-CM

## 2013-02-06 DIAGNOSIS — I2510 Atherosclerotic heart disease of native coronary artery without angina pectoris: Secondary | ICD-10-CM | POA: Diagnosis present

## 2013-02-06 DIAGNOSIS — L8992 Pressure ulcer of unspecified site, stage 2: Secondary | ICD-10-CM | POA: Diagnosis present

## 2013-02-06 DIAGNOSIS — L8994 Pressure ulcer of unspecified site, stage 4: Secondary | ICD-10-CM

## 2013-02-06 DIAGNOSIS — Z66 Do not resuscitate: Secondary | ICD-10-CM | POA: Diagnosis present

## 2013-02-06 DIAGNOSIS — G8929 Other chronic pain: Secondary | ICD-10-CM

## 2013-02-06 DIAGNOSIS — M899 Disorder of bone, unspecified: Secondary | ICD-10-CM | POA: Diagnosis present

## 2013-02-06 DIAGNOSIS — Z89511 Acquired absence of right leg below knee: Secondary | ICD-10-CM

## 2013-02-06 DIAGNOSIS — L89154 Pressure ulcer of sacral region, stage 4: Secondary | ICD-10-CM

## 2013-02-06 DIAGNOSIS — F329 Major depressive disorder, single episode, unspecified: Secondary | ICD-10-CM

## 2013-02-06 DIAGNOSIS — F341 Dysthymic disorder: Secondary | ICD-10-CM | POA: Diagnosis present

## 2013-02-06 DIAGNOSIS — L89314 Pressure ulcer of right buttock, stage 4: Secondary | ICD-10-CM

## 2013-02-06 DIAGNOSIS — I10 Essential (primary) hypertension: Secondary | ICD-10-CM | POA: Diagnosis present

## 2013-02-06 DIAGNOSIS — E871 Hypo-osmolality and hyponatremia: Secondary | ICD-10-CM | POA: Diagnosis present

## 2013-02-06 DIAGNOSIS — M8668 Other chronic osteomyelitis, other site: Secondary | ICD-10-CM

## 2013-02-06 DIAGNOSIS — Z96649 Presence of unspecified artificial hip joint: Secondary | ICD-10-CM

## 2013-02-06 DIAGNOSIS — A419 Sepsis, unspecified organism: Secondary | ICD-10-CM

## 2013-02-06 DIAGNOSIS — M949 Disorder of cartilage, unspecified: Secondary | ICD-10-CM | POA: Diagnosis present

## 2013-02-06 DIAGNOSIS — K219 Gastro-esophageal reflux disease without esophagitis: Secondary | ICD-10-CM | POA: Diagnosis present

## 2013-02-06 DIAGNOSIS — D509 Iron deficiency anemia, unspecified: Secondary | ICD-10-CM | POA: Diagnosis present

## 2013-02-06 DIAGNOSIS — S78119A Complete traumatic amputation at level between unspecified hip and knee, initial encounter: Secondary | ICD-10-CM

## 2013-02-06 DIAGNOSIS — R6521 Severe sepsis with septic shock: Secondary | ICD-10-CM

## 2013-02-06 DIAGNOSIS — F4310 Post-traumatic stress disorder, unspecified: Secondary | ICD-10-CM | POA: Diagnosis present

## 2013-02-06 DIAGNOSIS — M25552 Pain in left hip: Secondary | ICD-10-CM

## 2013-02-06 DIAGNOSIS — D638 Anemia in other chronic diseases classified elsewhere: Secondary | ICD-10-CM | POA: Diagnosis present

## 2013-02-06 DIAGNOSIS — Z993 Dependence on wheelchair: Secondary | ICD-10-CM

## 2013-02-06 DIAGNOSIS — M009 Pyogenic arthritis, unspecified: Secondary | ICD-10-CM

## 2013-02-06 DIAGNOSIS — J4489 Other specified chronic obstructive pulmonary disease: Secondary | ICD-10-CM | POA: Diagnosis present

## 2013-02-06 DIAGNOSIS — Z951 Presence of aortocoronary bypass graft: Secondary | ICD-10-CM

## 2013-02-06 DIAGNOSIS — Z978 Presence of other specified devices: Secondary | ICD-10-CM

## 2013-02-06 DIAGNOSIS — Z89611 Acquired absence of right leg above knee: Secondary | ICD-10-CM

## 2013-02-06 DIAGNOSIS — Z6841 Body Mass Index (BMI) 40.0 and over, adult: Secondary | ICD-10-CM

## 2013-02-06 DIAGNOSIS — R112 Nausea with vomiting, unspecified: Secondary | ICD-10-CM

## 2013-02-06 DIAGNOSIS — F1920 Other psychoactive substance dependence, uncomplicated: Secondary | ICD-10-CM | POA: Diagnosis present

## 2013-02-06 DIAGNOSIS — M86659 Other chronic osteomyelitis, unspecified thigh: Principal | ICD-10-CM | POA: Diagnosis present

## 2013-02-06 DIAGNOSIS — I252 Old myocardial infarction: Secondary | ICD-10-CM

## 2013-02-06 DIAGNOSIS — F32A Depression, unspecified: Secondary | ICD-10-CM

## 2013-02-06 DIAGNOSIS — D649 Anemia, unspecified: Secondary | ICD-10-CM

## 2013-02-06 DIAGNOSIS — G40909 Epilepsy, unspecified, not intractable, without status epilepticus: Secondary | ICD-10-CM

## 2013-02-06 MED ORDER — PROMETHAZINE HCL 25 MG/ML IJ SOLN
25.0000 mg | Freq: Once | INTRAMUSCULAR | Status: AC
  Administered 2013-02-06: 25 mg via INTRAMUSCULAR
  Filled 2013-02-06: qty 1

## 2013-02-06 MED ORDER — HYDROMORPHONE HCL PF 1 MG/ML IJ SOLN
2.0000 mg | Freq: Once | INTRAMUSCULAR | Status: AC
  Administered 2013-02-06: 2 mg via INTRAMUSCULAR
  Filled 2013-02-06: qty 2

## 2013-02-06 NOTE — ED Notes (Signed)
Patient states that he has been vomiting for 2 days and has been unable to take his meds. Would like to placed in a nursing home.
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
## 2013-02-07 ENCOUNTER — Encounter (HOSPITAL_COMMUNITY): Payer: Medicaid Other

## 2013-02-07 ENCOUNTER — Inpatient Hospital Stay (HOSPITAL_COMMUNITY): Payer: Medicaid Other

## 2013-02-07 ENCOUNTER — Encounter (HOSPITAL_COMMUNITY): Payer: Self-pay | Admitting: Radiology

## 2013-02-07 DIAGNOSIS — R112 Nausea with vomiting, unspecified: Secondary | ICD-10-CM

## 2013-02-07 DIAGNOSIS — L89309 Pressure ulcer of unspecified buttock, unspecified stage: Secondary | ICD-10-CM

## 2013-02-07 DIAGNOSIS — M25459 Effusion, unspecified hip: Secondary | ICD-10-CM

## 2013-02-07 DIAGNOSIS — G894 Chronic pain syndrome: Secondary | ICD-10-CM

## 2013-02-07 DIAGNOSIS — G8929 Other chronic pain: Secondary | ICD-10-CM

## 2013-02-07 DIAGNOSIS — M8668 Other chronic osteomyelitis, other site: Secondary | ICD-10-CM

## 2013-02-07 DIAGNOSIS — S7000XA Contusion of unspecified hip, initial encounter: Secondary | ICD-10-CM

## 2013-02-07 DIAGNOSIS — L89109 Pressure ulcer of unspecified part of back, unspecified stage: Secondary | ICD-10-CM

## 2013-02-07 DIAGNOSIS — L8991 Pressure ulcer of unspecified site, stage 1: Secondary | ICD-10-CM

## 2013-02-07 DIAGNOSIS — M25559 Pain in unspecified hip: Secondary | ICD-10-CM

## 2013-02-07 DIAGNOSIS — L8994 Pressure ulcer of unspecified site, stage 4: Secondary | ICD-10-CM

## 2013-02-07 LAB — BASIC METABOLIC PANEL
BUN: 5 mg/dL — ABNORMAL LOW (ref 6–23)
CO2: 29 mEq/L (ref 19–32)
Calcium: 8.3 mg/dL — ABNORMAL LOW (ref 8.4–10.5)
Calcium: 8.8 mg/dL (ref 8.4–10.5)
Creatinine, Ser: 0.64 mg/dL (ref 0.50–1.35)
GFR calc Af Amer: >90 mL/min (ref >90)
GFR calc non Af Amer: >90 mL/min (ref >90)
Glucose, Bld: 128 mg/dL — ABNORMAL HIGH (ref 70–99)
Glucose, Bld: 168 mg/dL — ABNORMAL HIGH (ref 70–99)

## 2013-02-07 LAB — URINALYSIS, ROUTINE W REFLEX MICROSCOPIC
Bilirubin Urine: NEGATIVE
Ketones, ur: NEGATIVE mg/dL
Nitrite: POSITIVE — AB
Protein, ur: NEGATIVE mg/dL
Urobilinogen, UA: 0.2 mg/dL (ref 0.0–1.0)

## 2013-02-07 LAB — CBC WITH DIFFERENTIAL/PLATELET
Basophils Relative: 0 % (ref 0–1)
Eosinophils Absolute: 0.1 10*3/uL (ref 0.0–0.7)
Eosinophils Relative: 1 % (ref 0–5)
Hemoglobin: 9.5 g/dL — ABNORMAL LOW (ref 13.0–17.0)
Lymphs Abs: 0.9 10*3/uL (ref 0.7–4.0)
MCH: 26.8 pg (ref 26.0–34.0)
MCHC: 32.8 g/dL (ref 30.0–36.0)
MCV: 81.9 fL (ref 78.0–100.0)
Monocytes Relative: 5 % (ref 3–12)
Neutrophils Relative %: 83 % — ABNORMAL HIGH (ref 43–77)
Platelets: 320 10*3/uL (ref 150–400)
RBC: 3.54 MIL/uL — ABNORMAL LOW (ref 4.22–5.81)

## 2013-02-07 LAB — DIFFERENTIAL
Basophils Absolute: 0 10*3/uL (ref 0.0–0.1)
Lymphocytes Relative: 10 % — ABNORMAL LOW (ref 12–46)
Lymphs Abs: 0.6 10*3/uL — ABNORMAL LOW (ref 0.7–4.0)
Monocytes Absolute: 0.5 10*3/uL (ref 0.1–1.0)
Monocytes Relative: 8 % (ref 3–12)
Neutro Abs: 5.1 10*3/uL (ref 1.7–7.7)

## 2013-02-07 LAB — CBC
HCT: 27.6 % — ABNORMAL LOW (ref 39.0–52.0)
Hemoglobin: 9.1 g/dL — ABNORMAL LOW (ref 13.0–17.0)
RBC: 3.33 MIL/uL — ABNORMAL LOW (ref 4.22–5.81)
RDW: 16.1 % — ABNORMAL HIGH (ref 11.5–15.5)
WBC: 6.3 10*3/uL (ref 4.0–10.5)

## 2013-02-07 LAB — HEPATIC FUNCTION PANEL
ALT: 11 U/L (ref 0–53)
AST: 12 U/L (ref 0–37)
Bilirubin, Direct: 0.2 mg/dL (ref 0.0–0.3)

## 2013-02-07 LAB — IRON AND TIBC
Iron: 18 ug/dL — ABNORMAL LOW (ref 42–135)
UIBC: 170 ug/dL (ref 125–400)

## 2013-02-07 LAB — LIPID PANEL
Cholesterol: 105 mg/dL (ref 0–200)
LDL Cholesterol: 59 mg/dL (ref 0–99)
VLDL: 21 mg/dL (ref 0–40)

## 2013-02-07 LAB — FERRITIN: Ferritin: 567 ng/mL — ABNORMAL HIGH (ref 22–322)

## 2013-02-07 LAB — RETICULOCYTES: Retic Count, Absolute: 50 10*3/uL (ref 19.0–186.0)

## 2013-02-07 MED ORDER — MAGNESIUM SULFATE IN D5W 10-5 MG/ML-% IV SOLN
1.0000 g | Freq: Once | INTRAVENOUS | Status: AC
  Administered 2013-02-07: 1 g via INTRAVENOUS
  Filled 2013-02-07: qty 100

## 2013-02-07 MED ORDER — POTASSIUM CHLORIDE IN NACL 20-0.9 MEQ/L-% IV SOLN
INTRAVENOUS | Status: DC
  Administered 2013-02-07: 100 mL/h via INTRAVENOUS
  Administered 2013-02-07 – 2013-02-10 (×5): via INTRAVENOUS

## 2013-02-07 MED ORDER — POLYETHYLENE GLYCOL 3350 17 G PO PACK
17.0000 g | PACK | Freq: Every day | ORAL | Status: DC
  Administered 2013-02-07 – 2013-02-21 (×7): 17 g via ORAL
  Filled 2013-02-07 (×17): qty 1

## 2013-02-07 MED ORDER — ENSURE COMPLETE PO LIQD
237.0000 mL | Freq: Four times a day (QID) | ORAL | Status: DC
  Administered 2013-02-07 – 2013-02-12 (×22): 237 mL via ORAL
  Administered 2013-02-12: 10:00:00 via ORAL
  Administered 2013-02-12 – 2013-02-25 (×49): 237 mL via ORAL

## 2013-02-07 MED ORDER — HYDROMORPHONE HCL PF 1 MG/ML IJ SOLN
2.0000 mg | INTRAMUSCULAR | Status: DC | PRN
  Administered 2013-02-07 – 2013-02-20 (×98): 2 mg via INTRAVENOUS
  Filled 2013-02-07 (×65): qty 2
  Filled 2013-02-07: qty 1
  Filled 2013-02-07 (×10): qty 2
  Filled 2013-02-07: qty 1
  Filled 2013-02-07 (×24): qty 2

## 2013-02-07 MED ORDER — VANCOMYCIN HCL IN DEXTROSE 1-5 GM/200ML-% IV SOLN
1000.0000 mg | Freq: Two times a day (BID) | INTRAVENOUS | Status: DC
  Administered 2013-02-07 – 2013-02-08 (×4): 1000 mg via INTRAVENOUS
  Filled 2013-02-07 (×7): qty 200

## 2013-02-07 MED ORDER — ONDANSETRON HCL 4 MG/2ML IJ SOLN
4.0000 mg | Freq: Four times a day (QID) | INTRAMUSCULAR | Status: DC | PRN
  Administered 2013-02-18 – 2013-02-20 (×2): 4 mg via INTRAVENOUS
  Filled 2013-02-07 (×2): qty 2

## 2013-02-07 MED ORDER — SODIUM CHLORIDE 0.9 % IJ SOLN
10.0000 mL | INTRAMUSCULAR | Status: DC | PRN
  Administered 2013-02-19: 10 mL
  Administered 2013-02-20: 20 mL
  Administered 2013-02-21 – 2013-02-25 (×2): 10 mL

## 2013-02-07 MED ORDER — CARISOPRODOL 350 MG PO TABS
350.0000 mg | ORAL_TABLET | Freq: Three times a day (TID) | ORAL | Status: DC
  Administered 2013-02-07 – 2013-02-25 (×56): 350 mg via ORAL
  Filled 2013-02-07 (×56): qty 1

## 2013-02-07 MED ORDER — OXYCODONE HCL ER 20 MG PO T12A
100.0000 mg | EXTENDED_RELEASE_TABLET | Freq: Two times a day (BID) | ORAL | Status: DC
  Administered 2013-02-08 – 2013-02-13 (×11): 100 mg via ORAL
  Filled 2013-02-07 (×11): qty 5

## 2013-02-07 MED ORDER — DULOXETINE HCL 30 MG PO CPEP
30.0000 mg | ORAL_CAPSULE | Freq: Every evening | ORAL | Status: DC
  Administered 2013-02-07 – 2013-02-25 (×19): 30 mg via ORAL
  Filled 2013-02-07 (×20): qty 1

## 2013-02-07 MED ORDER — VANCOMYCIN HCL IN DEXTROSE 1-5 GM/200ML-% IV SOLN
1000.0000 mg | Freq: Once | INTRAVENOUS | Status: AC
  Administered 2013-02-07: 1000 mg via INTRAVENOUS
  Filled 2013-02-07: qty 200

## 2013-02-07 MED ORDER — SODIUM CHLORIDE 0.9 % IJ SOLN
3.0000 mL | Freq: Two times a day (BID) | INTRAMUSCULAR | Status: DC
  Administered 2013-02-07 – 2013-02-13 (×10): 3 mL via INTRAVENOUS

## 2013-02-07 MED ORDER — SODIUM CHLORIDE 0.9 % IV SOLN
INTRAVENOUS | Status: DC
  Administered 2013-02-07: 01:00:00 via INTRAVENOUS

## 2013-02-07 MED ORDER — ALPRAZOLAM 0.5 MG PO TABS
1.0000 mg | ORAL_TABLET | Freq: Three times a day (TID) | ORAL | Status: DC
  Administered 2013-02-07 – 2013-02-25 (×56): 1 mg via ORAL
  Filled 2013-02-07: qty 1
  Filled 2013-02-07 (×2): qty 2
  Filled 2013-02-07: qty 1
  Filled 2013-02-07: qty 2
  Filled 2013-02-07: qty 1
  Filled 2013-02-07 (×2): qty 2
  Filled 2013-02-07 (×2): qty 1
  Filled 2013-02-07 (×4): qty 2
  Filled 2013-02-07: qty 1
  Filled 2013-02-07 (×5): qty 2
  Filled 2013-02-07: qty 1
  Filled 2013-02-07: qty 2
  Filled 2013-02-07: qty 1
  Filled 2013-02-07: qty 2
  Filled 2013-02-07 (×2): qty 1
  Filled 2013-02-07 (×2): qty 2
  Filled 2013-02-07 (×3): qty 1
  Filled 2013-02-07: qty 2
  Filled 2013-02-07: qty 1
  Filled 2013-02-07 (×3): qty 2
  Filled 2013-02-07: qty 1
  Filled 2013-02-07: qty 2
  Filled 2013-02-07: qty 1
  Filled 2013-02-07 (×2): qty 2
  Filled 2013-02-07 (×2): qty 1
  Filled 2013-02-07: qty 2
  Filled 2013-02-07: qty 1
  Filled 2013-02-07 (×2): qty 2
  Filled 2013-02-07: qty 1
  Filled 2013-02-07: qty 2
  Filled 2013-02-07: qty 1
  Filled 2013-02-07: qty 2
  Filled 2013-02-07 (×2): qty 1
  Filled 2013-02-07: qty 2
  Filled 2013-02-07 (×2): qty 1
  Filled 2013-02-07: qty 2
  Filled 2013-02-07: qty 1
  Filled 2013-02-07: qty 2

## 2013-02-07 MED ORDER — HYDROMORPHONE HCL PF 2 MG/ML IJ SOLN
2.0000 mg | Freq: Once | INTRAMUSCULAR | Status: AC
  Administered 2013-02-07: 2 mg via INTRAVENOUS
  Filled 2013-02-07: qty 1

## 2013-02-07 MED ORDER — MAGNESIUM SULFATE IN D5W 10-5 MG/ML-% IV SOLN
INTRAVENOUS | Status: AC
  Filled 2013-02-07: qty 100

## 2013-02-07 MED ORDER — ACETAMINOPHEN 650 MG RE SUPP: 650.0000 mg | Freq: Four times a day (QID) | RECTAL | Status: DC | PRN

## 2013-02-07 MED ORDER — PHENOBARBITAL 32.4 MG PO TABS
64.8000 mg | ORAL_TABLET | Freq: Every day | ORAL | Status: DC
  Administered 2013-02-07 – 2013-02-24 (×19): 64.8 mg via ORAL
  Filled 2013-02-07 (×18): qty 2

## 2013-02-07 MED ORDER — POTASSIUM CHLORIDE 10 MEQ/100ML IV SOLN
10.0000 meq | INTRAVENOUS | Status: AC
  Administered 2013-02-07 (×3): 10 meq via INTRAVENOUS
  Filled 2013-02-07 (×3): qty 100

## 2013-02-07 MED ORDER — ACETAMINOPHEN 325 MG PO TABS
650.0000 mg | ORAL_TABLET | Freq: Four times a day (QID) | ORAL | Status: DC | PRN
  Administered 2013-02-07 – 2013-02-18 (×2): 650 mg via ORAL
  Filled 2013-02-07 (×3): qty 2

## 2013-02-07 MED ORDER — ALBUTEROL SULFATE (5 MG/ML) 0.5% IN NEBU: 2.5000 mg | INHALATION_SOLUTION | RESPIRATORY_TRACT | Status: DC | PRN

## 2013-02-07 MED ORDER — IOHEXOL 300 MG/ML  SOLN
100.0000 mL | Freq: Once | INTRAMUSCULAR | Status: AC | PRN
  Administered 2013-02-07: 100 mL via INTRAVENOUS

## 2013-02-07 MED ORDER — ONDANSETRON HCL 4 MG PO TABS
4.0000 mg | ORAL_TABLET | Freq: Four times a day (QID) | ORAL | Status: DC | PRN
  Administered 2013-02-07: 4 mg via ORAL
  Filled 2013-02-07: qty 1

## 2013-02-07 MED ORDER — DOCUSATE SODIUM 100 MG PO CAPS
100.0000 mg | ORAL_CAPSULE | Freq: Two times a day (BID) | ORAL | Status: DC
  Administered 2013-02-07 – 2013-02-25 (×24): 100 mg via ORAL
  Filled 2013-02-07 (×37): qty 1

## 2013-02-07 MED ORDER — ENOXAPARIN SODIUM 40 MG/0.4ML ~~LOC~~ SOLN
40.0000 mg | SUBCUTANEOUS | Status: DC
  Administered 2013-02-07 – 2013-02-15 (×7): 40 mg via SUBCUTANEOUS
  Filled 2013-02-07 (×19): qty 0.4

## 2013-02-07 MED ORDER — PHENOBARBITAL 32.4 MG PO TABS
32.4000 mg | ORAL_TABLET | Freq: Every day | ORAL | Status: DC
  Administered 2013-02-07 – 2013-02-25 (×19): 32.4 mg via ORAL
  Filled 2013-02-07 (×19): qty 1

## 2013-02-07 MED ORDER — SODIUM CHLORIDE 0.9 % IJ SOLN
10.0000 mL | Freq: Two times a day (BID) | INTRAMUSCULAR | Status: DC
  Administered 2013-02-07: 40 mL
  Administered 2013-02-08 (×2): 10 mL
  Administered 2013-02-09: 20 mL
  Administered 2013-02-09 – 2013-02-10 (×3): 10 mL
  Administered 2013-02-11: 20 mL
  Administered 2013-02-11: 13 mL
  Administered 2013-02-12 – 2013-02-13 (×3): 10 mL

## 2013-02-07 MED ORDER — FERROUS SULFATE 325 (65 FE) MG PO TABS
325.0000 mg | ORAL_TABLET | Freq: Two times a day (BID) | ORAL | Status: DC
  Administered 2013-02-07 – 2013-02-25 (×38): 325 mg via ORAL
  Filled 2013-02-07 (×39): qty 1
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
## 2013-02-07 NOTE — Progress Notes (Signed)
UR chart review completed.  
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
## 2013-02-07 NOTE — Progress Notes (Signed)
*  PRELIMINARY RESULTS* Echocardiogram 2D Echocardiogram has been performed.  Chris [name] 02/07/2013, 1:59 PM

## 2013-02-07 NOTE — Progress Notes (Signed)
0830 Notified Dr. Lovell Sheehan of the new consult.  He acknowledges consult request and states that he will see the patient.

## 2013-02-07 NOTE — Progress Notes (Signed)
Pt states he has to take Tylenol with something for his stomach. He says he has to have something for nausea. Given zofran po per PRN order.
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
## 2013-02-07 NOTE — Clinical Social Work Note (Addendum)
CSW presented bed offer received in [facility] and pt states he does not want to go there and to see what is available in [place]. CSW initiated bed search in [facility] [place] screener to complete face to face evaluation this afternoon.   Derenda Fennel, [place] [phone]

## 2013-02-07 NOTE — Progress Notes (Addendum)
0730 Notified Dr. Janna Arch about the patient and the patients poor IV access and that his risk for infection related to vancomycin.  I recommended that the patient receive PICC access due to the fact that he may be on abx for a will.  New orders given and followed.     7829 Notified Dr Romeo Apple of an consult ordered for left hip pain. He acknowledges consult request and states that he will see the patient.    1440 Notified Dr. Romeo Apple of the CT of the left hip results that were called to me.  He is currently on the floor and will see the patient.
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
## 2013-02-07 NOTE — Progress Notes (Signed)
[id number] 
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
## 2013-02-07 NOTE — Clinical Social Work Placement (Signed)
Clinical Social Work Department CLINICAL SOCIAL WORK PLACEMENT NOTE 02/07/2013  Patient:  ROWE, WARMAN  Account Number:  [id number] Admit date:  02/06/2013  Clinical Social Worker:  Derenda Fennel, LCSW  Date/time:  02/07/2013 09:05 AM  Clinical Social Work is seeking post-discharge placement for this patient at the following level of care:   SKILLED NURSING   (*CSW will update this form in Epic as items are completed)   02/07/2013  Patient/family provided with [facility] Department of Clinical Social Work's list of facilities offering this level of care within the geographic area requested by the patient (or if unable, by the patient's family).  02/07/2013  Patient/family informed of their freedom to choose among providers that offer the needed level of care, that participate in Medicare, Medicaid or managed care program needed by the patient, have an available bed and are willing to accept the patient.  02/07/2013  Patient/family informed of [facility]' ownership interest in [facility], as well as of the fact that they are under no obligation to receive care at this facility.  PASARR submitted to EDS on 02/07/2013 PASARR number received from EDS on   FL2 transmitted to all facilities in geographic area requested by pt/family on  02/07/2013 FL2 transmitted to all facilities within larger geographic area on   Patient informed that his/her managed care company has contracts with or will negotiate with  certain facilities, including the following:     Patient/family informed of bed offers received:   Patient chooses bed at  Physician recommends and patient chooses bed at    Patient to be transferred to  on   Patient to be transferred to facility by   The following physician request were entered in Epic:   Additional Comments:  Derenda Fennel, LCSW [phone]

## 2013-02-07 NOTE — ED Provider Notes (Signed)
Medical screening examination/treatment/procedure(s) were performed by non-physician practitioner and as supervising physician I was immediately available for consultation/collaboration.  Sunnie Nielsen, MD 02/07/13 [phone]

## 2013-02-08 LAB — BASIC METABOLIC PANEL
BUN: 10 mg/dL (ref 6–23)
CO2: 32 mEq/L (ref 19–32)
Calcium: 8.2 mg/dL — ABNORMAL LOW (ref 8.4–10.5)
Chloride: 94 mEq/L — ABNORMAL LOW (ref 96–112)
Creatinine, Ser: 0.36 mg/dL — ABNORMAL LOW (ref 0.50–1.35)
Glucose, Bld: 113 mg/dL — ABNORMAL HIGH (ref 70–99)

## 2013-02-08 LAB — HEPATIC FUNCTION PANEL
ALT: 13 U/L (ref 0–53)
Alkaline Phosphatase: 160 U/L — ABNORMAL HIGH (ref 39–117)
Total Bilirubin: 0.2 mg/dL — ABNORMAL LOW (ref 0.3–1.2)

## 2013-02-08 NOTE — Progress Notes (Signed)
Notified Dr. Ouida Sills of pt request for regular food.  Voiced to him that the patient had no vomiting today or yesterday.  New orders given as followed.

## 2013-02-08 NOTE — Progress Notes (Signed)
[id number] 
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
## 2013-02-08 NOTE — Plan of Care (Signed)
Problem: Phase I Progression Outcomes Goal: Voiding-avoid urinary catheter unless indicated Outcome: Not Met (add Reason) Foley is chronic from home.  He has healing sacral wounds.

## 2013-02-09 LAB — BASIC METABOLIC PANEL
Calcium: 8.5 mg/dL (ref 8.4–10.5)
Creatinine, Ser: 0.41 mg/dL — ABNORMAL LOW (ref 0.50–1.35)
GFR calc non Af Amer: >90 mL/min (ref >90)
Glucose, Bld: 129 mg/dL — ABNORMAL HIGH (ref 70–99)
Sodium: 134 mEq/L — ABNORMAL LOW (ref 135–145)

## 2013-02-09 LAB — DIFFERENTIAL
Basophils Absolute: 0 10*3/uL (ref 0.0–0.1)
Basophils Relative: 0 % (ref 0–1)
Eosinophils Absolute: 0.1 10*3/uL (ref 0.0–0.7)
Monocytes Absolute: 0.2 10*3/uL (ref 0.1–1.0)
Monocytes Relative: 7 % (ref 3–12)
Neutro Abs: 2 10*3/uL (ref 1.7–7.7)
Neutrophils Relative %: 69 % (ref 43–77)

## 2013-02-09 LAB — CBC
Hemoglobin: 8.4 g/dL — ABNORMAL LOW (ref 13.0–17.0)
MCH: 26.9 pg (ref 26.0–34.0)
MCHC: 31.5 g/dL (ref 30.0–36.0)
Platelets: 280 10*3/uL (ref 150–400)

## 2013-02-09 LAB — VANCOMYCIN, TROUGH: Vancomycin Tr: 8.2 ug/mL — ABNORMAL LOW (ref 10.0–20.0)

## 2013-02-09 LAB — TSH: TSH: 1.494 u[IU]/mL (ref 0.350–4.500)

## 2013-02-09 LAB — URINE CULTURE

## 2013-02-09 MED ORDER — VANCOMYCIN HCL 10 G IV SOLR
1250.0000 mg | Freq: Two times a day (BID) | INTRAVENOUS | Status: DC
  Administered 2013-02-09 – 2013-02-11 (×5): 1250 mg via INTRAVENOUS
  Filled 2013-02-09 (×6): qty 1250

## 2013-02-09 MED ORDER — VANCOMYCIN HCL IN DEXTROSE 1-5 GM/200ML-% IV SOLN
1000.0000 mg | INTRAVENOUS | Status: AC
  Administered 2013-02-09: 1000 mg via INTRAVENOUS
  Filled 2013-02-09: qty 200

## 2013-02-09 NOTE — Progress Notes (Signed)
[id number] 

## 2013-02-09 NOTE — Progress Notes (Signed)
Notified Dr. Delbert Harness that the patient has been refusing to take his Lovenox.  He states that he doesn't need to take the medication due to him having filters.  MD stated to encourage the patient to take the medication because the filters are made to catch the clot, not prevent them.  I discussed this with the  Patient and he verbalized understanding and voices he will begin to take the injections.
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
## 2013-02-09 NOTE — Progress Notes (Signed)
Patient ID: Chris [name], male   DOB: 1956/07/17, 57 y.o.   MRN: [id number] CHART REVIEW  NO FEVER , WBC LOW ? IMMUNOSUPPRESSION ? WILL RE EXAMINE TOMORROW

## 2013-02-10 LAB — BASIC METABOLIC PANEL
BUN: 11 mg/dL (ref 6–23)
Calcium: 9 mg/dL (ref 8.4–10.5)
Chloride: 95 mEq/L — ABNORMAL LOW (ref 96–112)
Creatinine, Ser: 0.43 mg/dL — ABNORMAL LOW (ref 0.50–1.35)
GFR calc Af Amer: >90 mL/min (ref >90)
GFR calc non Af Amer: >90 mL/min (ref >90)

## 2013-02-10 LAB — DIFFERENTIAL
Basophils Absolute: 0 10*3/uL (ref 0.0–0.1)
Basophils Relative: 0 % (ref 0–1)
Lymphocytes Relative: 23 % (ref 12–46)
Monocytes Absolute: 0.2 10*3/uL (ref 0.1–1.0)
Neutro Abs: 2.1 10*3/uL (ref 1.7–7.7)
Neutrophils Relative %: 66 % (ref 43–77)

## 2013-02-10 LAB — RETICULOCYTES
RBC.: 3.42 MIL/uL — ABNORMAL LOW (ref 4.22–5.81)
Retic Count, Absolute: 51.3 10*3/uL (ref 19.0–186.0)
Retic Ct Pct: 1.5 % (ref 0.4–3.1)

## 2013-02-10 LAB — CBC
MCHC: 31.8 g/dL (ref 30.0–36.0)
Platelets: 346 10*3/uL (ref 150–400)
RDW: 15.9 % — ABNORMAL HIGH (ref 11.5–15.5)
WBC: 3.2 10*3/uL — ABNORMAL LOW (ref 4.0–10.5)

## 2013-02-10 MED ORDER — SODIUM CHLORIDE 0.9 % IV SOLN
INTRAVENOUS | Status: DC
  Administered 2013-02-10 – 2013-02-19 (×14): via INTRAVENOUS
  Administered 2013-02-21: 100 mL/h via INTRAVENOUS
  Administered 2013-02-22 – 2013-02-25 (×5): via INTRAVENOUS
  Administered 2013-02-25: 10 mL/h via INTRAVENOUS

## 2013-02-10 NOTE — Progress Notes (Signed)
[id number] 
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
## 2013-02-11 ENCOUNTER — Inpatient Hospital Stay (HOSPITAL_COMMUNITY): Payer: Medicaid Other

## 2013-02-11 MED ORDER — IOHEXOL 300 MG/ML  SOLN
100.0000 mL | Freq: Once | INTRAMUSCULAR | Status: AC | PRN
  Administered 2013-02-11: 100 mL via INTRAVENOUS

## 2013-02-11 NOTE — Progress Notes (Signed)
Patient refused CAT Scan. Will notify MD.

## 2013-02-11 NOTE — Progress Notes (Signed)
[id number] 

## 2013-02-11 NOTE — Progress Notes (Signed)
Patient had lab result of K 5.2. Dr. Renard Matter notified and order to change IV fluid to NS was followed.

## 2013-02-11 NOTE — Clinical Social Work Note (Addendum)
CSW presented bed offers to pt and he chooses [facility]. CSW notified facility. They were made aware of 30 day pasarr. CSW will initiate Medicaid prior approval and LOG. Possible d/c tomorrow per MD.  Derenda Fennel, LCSW [phone]

## 2013-02-11 NOTE — Clinical Social Work Placement (Addendum)
Clinical Social Work Department CLINICAL SOCIAL WORK PLACEMENT NOTE 02/11/2013  Patient:  Chris [name], Chris [name]  Account Number:  [id number] Admit date:  02/06/2013  Clinical Social Worker:  Derenda Fennel, LCSW  Date/time:  02/07/2013 09:05 AM  Clinical Social Work is seeking post-discharge placement for this patient at the following level of care:   SKILLED NURSING   (*CSW will update this form in Epic as items are completed)   02/07/2013  Patient/family provided with [facility] Department of Clinical Social Work's list of facilities offering this level of care within the geographic area requested by the patient (or if unable, by the patient's family).  02/07/2013  Patient/family informed of their freedom to choose among providers that offer the needed level of care, that participate in Medicare, Medicaid or managed care program needed by the patient, have an available bed and are willing to accept the patient.  02/07/2013  Patient/family informed of [facility]' ownership interest in [facility], as well as of the fact that they are under no obligation to receive care at this facility.  PASARR submitted to EDS on 02/07/2013 PASARR number received from EDS on 02/10/2013  FL2 transmitted to all facilities in geographic area requested by pt/family on  02/07/2013 FL2 transmitted to all facilities within larger geographic area on   Patient informed that his/her managed care company has contracts with or will negotiate with  certain facilities, including the following:     Patient/family informed of bed offers received:  02/11/2013 Patient chooses bed at [facility] [place] [facility] [place] Physician recommends and patient chooses bed at  [facility] [place]  Patient to be transferred to  on  02/25/2013 Patient to be transferred to facility by [facility]  The following physician request were entered in Epic:   Additional Comments: 60 day pasarr. Facility aware. CSW initiating Medicaid prior approval  and LOG.  Derenda Fennel, [place] [phone]

## 2013-02-12 LAB — CBC
Hemoglobin: 9.5 g/dL — ABNORMAL LOW (ref 13.0–17.0)
MCH: 26.9 pg (ref 26.0–34.0)
RBC: 3.53 MIL/uL — ABNORMAL LOW (ref 4.22–5.81)
WBC: 3 10*3/uL — ABNORMAL LOW (ref 4.0–10.5)

## 2013-02-12 LAB — VANCOMYCIN, TROUGH: Vancomycin Tr: 13.2 ug/mL (ref 10.0–20.0)

## 2013-02-12 LAB — BASIC METABOLIC PANEL
CO2: 33 mEq/L — ABNORMAL HIGH (ref 19–32)
Calcium: 8.8 mg/dL (ref 8.4–10.5)
Creatinine, Ser: 0.46 mg/dL — ABNORMAL LOW (ref 0.50–1.35)
GFR calc non Af Amer: >90 mL/min (ref >90)
Sodium: 132 mEq/L — ABNORMAL LOW (ref 135–145)

## 2013-02-12 LAB — DIFFERENTIAL
Lymphocytes Relative: 16 % (ref 12–46)
Lymphs Abs: 0.5 10*3/uL — ABNORMAL LOW (ref 0.7–4.0)
Monocytes Relative: 9 % (ref 3–12)
Neutro Abs: 2.2 10*3/uL (ref 1.7–7.7)
Neutrophils Relative %: 72 % (ref 43–77)

## 2013-02-12 LAB — GLUCOSE, CAPILLARY: Glucose-Capillary: 140 mg/dL — ABNORMAL HIGH (ref 70–99)

## 2013-02-12 MED ORDER — GABAPENTIN 300 MG PO CAPS: 300.0000 mg | ORAL_CAPSULE | Freq: Two times a day (BID) | ORAL | Status: DC

## 2013-02-12 MED ORDER — VANCOMYCIN HCL 10 G IV SOLR
1500.0000 mg | Freq: Two times a day (BID) | INTRAVENOUS | Status: DC
  Administered 2013-02-12 – 2013-02-20 (×17): 1500 mg via INTRAVENOUS
  Filled 2013-02-12 (×22): qty 1500

## 2013-02-12 NOTE — Progress Notes (Signed)
02/12/13 1650 Notified Dr Janna Arch of general surgery consult and orthopedics consults placed today. Orthopedics to see patient, no need for general surgical intervention per Dr Lovell Sheehan. Dr Romeo Apple recommendations pending. Stated okay. Also notified of patient request for pain medication increased if possible. Stated continue dilaudid as ordered PRN and scheduled oxycontin as ordered, no changes. Order received for neurontin 300 mg po BID. Order placed as given. Earnstine Regal, RN

## 2013-02-12 NOTE — Progress Notes (Signed)
[id number] 

## 2013-02-12 NOTE — Progress Notes (Signed)
02/12/13 1923 Discussed neurontin order with patient, pt states "i'm allergic to neurontin, it makes me crazy and violent". Updated neurontin on allergy list. Notified Dr. Janna Arch this evening, stated okay to d/c neurontin. No other orders for pain management received. Earnstine Regal, RN
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
## 2013-02-12 NOTE — Progress Notes (Signed)
Patient ID: Chris [name], male   DOB: 04-24-1956, 57 y.o.   MRN: [id number] Review CT scan CBC    Component Value Date/Time   WBC 3.0* 02/12/2013 0445   RBC 3.53* 02/12/2013 0445   HGB 9.5* 02/12/2013 0445   HCT 29.9* 02/12/2013 0445   PLT 327 02/12/2013 0445   MCV 84.7 02/12/2013 0445   MCH 26.9 02/12/2013 0445   MCHC 31.8 02/12/2013 0445   RDW 16.2* 02/12/2013 0445   LYMPHSABS 0.5* 02/12/2013 0445   MONOABS 0.3 02/12/2013 0445   EOSABS 0.1 02/12/2013 0445   BASOSABS 0.0 02/12/2013 0445    The nail is stable in position, there is a fracture of the acetabulum (could be source of pain) . The WBC do not indicate infection If the nail needs to come out, I can not remove here ( not sure what kind, too complicated and patient too high a risk)  CT guided aspirate of hip could be done if want to prove septic joint. If not improving ( I dont see a temp curve c/w infection) rec transfer to tertiary facility

## 2013-02-12 NOTE — Progress Notes (Signed)
02/12/13 1549 Consults reordered for general surgery and orthopedics to follow-up on repeat CT findings and recommendations. Notified Dr Lovell Sheehan on rounds this morning, stated had seen patient regarding wounds, but orthopedics would cover CT findings since dealing with left hip/joint issue. Dr Romeo Apple consulted and already seeing patient per report this morning. Spoke with Dr. Romeo Apple this afternoon regarding consult seeking recommendations regarding need for surgery and length of antibiotics.  Stated would review CT findings, but patient would require transfer to another facility if surgical intervention needed. Dr. Janna Arch paged to notify. Earnstine Regal, RN

## 2013-02-12 NOTE — Plan of Care (Signed)
Problem: Phase II Progression Outcomes Goal: Obtain order to discontinue catheter if appropriate Outcome: Not Applicable Date Met:  02/12/13 72314 1248 Chronic foley catheter in place. Patient states had foley catheter in place at home.
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
## 2013-02-13 ENCOUNTER — Encounter (HOSPITAL_BASED_OUTPATIENT_CLINIC_OR_DEPARTMENT_OTHER): Payer: Medicaid Other

## 2013-02-13 MED ORDER — OXYCODONE HCL ER 10 MG PO T12A: 100.0000 mg | EXTENDED_RELEASE_TABLET | Freq: Two times a day (BID) | ORAL | Status: DC

## 2013-02-13 MED ORDER — OXYCODONE HCL ER 10 MG PO T12A
100.0000 mg | EXTENDED_RELEASE_TABLET | Freq: Two times a day (BID) | ORAL | Status: DC
  Administered 2013-02-13 – 2013-02-25 (×24): 100 mg via ORAL
  Filled 2013-02-13 (×5): qty 10
  Filled 2013-02-13: qty 9
  Filled 2013-02-13: qty 10
  Filled 2013-02-13: qty 1
  Filled 2013-02-13 (×17): qty 10

## 2013-02-13 NOTE — Discharge Summary (Signed)
[id number] 

## 2013-02-13 NOTE — Progress Notes (Addendum)
02/13/13 1827 Report given to Josh with [facility] this evening. Pt in stable condition awaiting transfer to [facility]. Earnstine Regal, RN

## 2013-02-13 NOTE — Plan of Care (Signed)
Problem: Phase III Progression Outcomes Goal: Voiding independently Outcome: Not Applicable Date Met:  02/13/13 02/13/13 1054 patient has chronic foley catheter from home. Earnstine Regal, RN

## 2013-02-13 NOTE — Clinical Social Work Note (Signed)
Pt is transferring to [facility] today. Will discuss with receiving CSW tomorrow. CSW updated [facility] regarding transfer and remain willing to accept pt at this time.   Derenda Fennel, [place] [phone]

## 2013-02-13 NOTE — Progress Notes (Signed)
02/13/13 1907 Patient transferred to [facility] via [facility] transport. Pt left floor in stable condition. Report called to receiving nurse, Gilman Buttner this evening. Pt spoke with his sister Cannon Kettle this evening to notify of transfer. Empty syringe noted in patient belongings bag just prior to transfer, unsure of where patient obtained syringe. Notified charge nurse. Stated to notify receiving nurse to monitor and make sure patient does not have items that could pose safety issue. Notified receiving nurse during report. Morrie Sheldon Teo Moede,RN
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
## 2013-02-13 NOTE — Progress Notes (Signed)
02/13/13 1757 Late entry for 1730. Notified [facility] of need for transport to [facility]. Bed obtained on orthopedic unit, pt aware of and agreeable to transfer. Awaiting [facility] transport. Earnstine Regal, RN

## 2013-02-13 NOTE — Progress Notes (Signed)
57 year old gentleman with extensive h/o including bilateral AKA, came in for a fall,  To [facility] hospital on 7/18 . He was started on IV vancomycin for presumed osteomyelitis and an orthopedic consulted for further management. Repeat CT of the hip 5 days later showed gas int he left hip joint, and deterioration of the left hip. Pts PCP requested patient to be transferred to tertiary care facility because of complicated nature of the left and requested an orthopedic and infectious disease consult. Patient is hemodynamically stable, he is afebrile, without leukocytosis. He is being accepted to [facility] med surg bed at [facility].   Please call Orthopedic consult on arrival.     Kathlen Mody, MD [phone]

## 2013-02-13 NOTE — Progress Notes (Signed)
Patient ID: Chris [name], male   DOB: 12-22-1955, 57 y.o.   MRN: [id number] Reexamination of the patient  The patient has no erythema on the front of his hip or lateral portion of his leg he does have tenderness over the lateral portion of the previous incision to install the gamma nail he says that the left leg/hip is still hurting him, he does have decubitus ulcers grade 4 posteriorly.  My feeling on this patient is that he is a high risk for anesthesia, he has an unfamiliar implant which would require a universal implant removal kit to remove, he has possible osteomyelitis septic arthritis left hip but needs tissue or fluid culture to confirm. What we'll probably need to be done is to remove the implant, get cultures of the hip joint and proceed from there. His clinical course is unusual for infection as he's been afebrile with a normal white count no left shift no increase in his sugar and no definitive abscess.  CT scan does show deterioration of his hip but this could just as easily be from his penetrating nail and trauma  It is appropriate or precancerous patient to a tertiary care facility as he is indeed very complicated and will need appropriate anesthetic support, postoperative and perioperative medical support as well as highly experienced traumatologist and infectious disease specialist.
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
## 2013-02-13 NOTE — Progress Notes (Signed)
Patient still complaining of pain in that left hip which is not being relieved by his current pain regiment.  Patient is now not wanting to move for the pain that it causes him.

## 2013-02-14 ENCOUNTER — Encounter (HOSPITAL_COMMUNITY): Payer: Self-pay | Admitting: Internal Medicine

## 2013-02-14 DIAGNOSIS — Z933 Colostomy status: Secondary | ICD-10-CM

## 2013-02-14 LAB — CREATININE, SERUM
Creatinine, Ser: 0.51 mg/dL (ref 0.50–1.35)
GFR calc non Af Amer: >90 mL/min (ref >90)

## 2013-02-14 LAB — CBC
MCHC: 31.5 g/dL (ref 30.0–36.0)
RDW: 16.5 % — ABNORMAL HIGH (ref 11.5–15.5)

## 2013-02-14 MED ORDER — SODIUM CHLORIDE 0.9 % IJ SOLN
3.0000 mL | Freq: Two times a day (BID) | INTRAMUSCULAR | Status: DC
  Administered 2013-02-14: 3 mL via INTRAVENOUS

## 2013-02-14 MED ORDER — ENOXAPARIN SODIUM 40 MG/0.4ML ~~LOC~~ SOLN: 40.0000 mg | SUBCUTANEOUS | Status: DC
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
## 2013-02-14 NOTE — Progress Notes (Signed)
NUTRITION FOLLOW UP  Intervention:   Continue Ensure Complete po QID, each supplement provides 350 kcal and 13 grams of protein.   NUTRITION DIAGNOSIS:  Increased protein-energy needs related to wound healing as evidenced by chronic pressure ulcers right/left buttocks; ongoing.   Goal:  Pt to meet >/= 90% of their estimated nutrition needs; not met.   Monitor:  Diet advancement, po intake, labs and wt trends  Assessment:   Pt with hx of multiple medical problems including malnutrition, chronic decubitus ulcers. S/p bilateral AKA.  Pt transferred from [facility] to [facility] for orthopedic evaluation as pt would be a high risk for surgery.  Pt states he has a poor appetite. Lunch at bedside pt had consumed 25%. Pt loves ensure and states he is drinking about 6 per day. Unsure of the accuracy of this. Pt states he likes to put ensure in his coffee but also drinks ensure by itself.   Height: Ht Readings from Last 1 Encounters:  02/07/13 4\' 2"  (1.27 m)   Per past medical records pt was 6'1" prior to bilateral AKA.   Weight Status:   Wt Readings from Last 1 Encounters:  02/12/13 199 lb 4.7 oz (90.4 kg)  Adjusted IBW: 73.7 kg  Adjusted BMI: 29.8  Re-estimated needs:  Kcal: 1900-2100 Protein: 90-115 grams Fluid: > 2 L/day  Skin: multiple stage IV decubitus ulcers, chronic  Diet Order: Parke Simmers   Intake/Output Summary (Last 24 hours) at 02/14/13 1128 Last data filed at 02/14/13 0957  Gross per 24 hour  Intake 3025.67 ml  Output   5200 ml  Net -2174.33 ml    Last BM: via diverting colostomy 300 ml 7/24 450 ml and 1 unmeasured 7/23   Labs:   Recent Labs Lab 02/09/13 0525 02/10/13 0635 02/12/13 0448 02/14/13 0222  NA 134* 133* 132*  --   K 4.6 5.2* 4.4  --   CL 97 95* 92*  --   CO2 30 31 33*  --   BUN 9 11 11   --   CREATININE 0.41* 0.43* 0.46* 0.51  CALCIUM 8.5 9.0 8.8  --   GLUCOSE 129* 98 113*  --     CBG (last 3)   Recent Labs  02/12/13 0828 02/12/13 1122  GLUCAP  142* 140*    Scheduled Meds: . ALPRAZolam  1 mg Oral TID  . carisoprodol  350 mg Oral TID  . docusate sodium  100 mg Oral BID  . DULoxetine  30 mg Oral QPM  . enoxaparin (LOVENOX) injection  40 mg Subcutaneous Q24H  . feeding supplement  237 mL Oral QID  . ferrous sulfate  325 mg Oral BID WC  . OxyCODONE  100 mg Oral Q12H  . PHENobarbital  32.4 mg Oral Daily  . phenobarbital  64.8 mg Oral QHS  . polyethylene glycol  17 g Oral Daily  . sodium chloride  10-40 mL Intracatheter Q12H  . sodium chloride  3 mL Intravenous Q12H  . sodium chloride  3 mL Intravenous Q12H  . vancomycin  1,500 mg Intravenous Q12H    Continuous Infusions: . sodium chloride 100 mL/hr at 02/13/13 [address], LDN, CNSC [phone] Pager [phone] After Hours Pager
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
## 2013-02-14 NOTE — Progress Notes (Signed)
Received call from telemetry pt noted 12 beat run of Vtach. Pt denies chest pain or SOB. VS as follows: 99.3,86,20,108/51 o2sats at 95% room air. Notified MD on call K.Kirby with no new order made.

## 2013-02-14 NOTE — Progress Notes (Signed)
RN changed 2 sacral wound dressings today at 1700. Wound irrigated with irrigation saline, then hydrogel placed in wound, then gauze. Covered packed wounds with gauze and ABD pads. Patient told to tell Nursing staff when he felt the linen was soiled, or wet from him sweating to keep bottom clean and dry. RN also educated patient to try and turn off bottom as often as possible.

## 2013-02-14 NOTE — Progress Notes (Signed)
Patient seen and admitted earlier this AM by my associate.  Please refer to his H and P for further details regarding Assessment and Plan.  Will reevaluate next am.  Consulted ID and Ortho as recommended by my associate.  Isaac Dubie, [employer]

## 2013-02-15 DIAGNOSIS — Z9889 Other specified postprocedural states: Secondary | ICD-10-CM

## 2013-02-15 DIAGNOSIS — G40909 Epilepsy, unspecified, not intractable, without status epilepticus: Secondary | ICD-10-CM

## 2013-02-15 MED ORDER — OXYCODONE HCL 5 MG PO TABS
10.0000 mg | ORAL_TABLET | ORAL | Status: DC | PRN
  Administered 2013-02-15 – 2013-02-25 (×49): 10 mg via ORAL
  Filled 2013-02-15 (×49): qty 2

## 2013-02-15 NOTE — Progress Notes (Addendum)
[facility] PROGRESS NOTE  Chris [name] OAC:[id number] DOB: 07-Mar-1956 DOA: 02/06/2013 PCP: [name], MD  Assessment/Plan: 1. Chronic Osteomyelitis of left hip - ID on board - Patient currently on Vancomycin  2. Left hip pain/c h/o left hip fracture s/p left hip surgery and hardware several years ago.  Hip fracture - Ortho on board.  Currently plans are for no surgery - Pain control.  Will add Oxycodone IR for breakthrough pain medication  3. Seizure d/o - continue home regimen - no breakthrough seizures reported.  Addendum 4. Stage IV decubitus sacral ulcer - consulted wound care nurse for further evaluation and recommendations.  Code Status: DNR Family Communication: No family at bedside. Disposition Plan: Since there is no plans for surgery soon then disposition will depend on treatment recommendations from ID for patient's chronic osteomyelitis   Consultants:  Orthopaedic surgeon  Infectious Disease  Procedures:  None  Antibiotics:  Vancomycin   HPI/Subjective: No new complaints. No acute issues reported overnight.  Objective: Filed Vitals:   02/14/13 0727 02/14/13 1521 02/14/13 2100 02/15/13 0535  BP: 96/53 114/53 108/43 112/60  Pulse: 85 83 82 83  Temp: 99 F (37.2 C) 97.7 F (36.5 C) 97.9 F (36.6 C) 97.8 F (36.6 C)  TempSrc:  Oral Oral Oral  Resp:  18 18 18   Height:      Weight:      SpO2: 95% 97% 98% 98%    Intake/Output Summary (Last 24 hours) at 02/15/13 1226 Last data filed at 02/15/13 0109  Gross per 24 hour  Intake   2395 ml  Output   4050 ml  Net  -1655 ml   Filed Weights   02/10/13 0529 02/10/13 2134 02/12/13 0506  Weight: 89.3 kg (196 lb 13.9 oz) 91 kg (200 lb 9.9 oz) 90.4 kg (199 lb 4.7 oz)    Exam:   General:  Pt in NAD, Alert and awake  Cardiovascular: RRR, no MRG  Respiratory: CTA BL, no wheezes  Abdomen: soft, NT, ND  Musculoskeletal: Pt has BL AKA, left hip edematous and painful on palpation    Data Reviewed: Basic Metabolic Panel:  Recent Labs Lab 02/09/13 0525 02/10/13 0635 02/12/13 0448 02/14/13 0222  NA 134* 133* 132*  --   K 4.6 5.2* 4.4  --   CL 97 95* 92*  --   CO2 30 31 33*  --   GLUCOSE 129* 98 113*  --   BUN 9 11 11   --   CREATININE 0.41* 0.43* 0.46* 0.51  CALCIUM 8.5 9.0 8.8  --    Liver Function Tests: No results found for this basename: AST, ALT, ALKPHOS, BILITOT, PROT, ALBUMIN,  in the last 168 hours No results found for this basename: LIPASE, AMYLASE,  in the last 168 hours No results found for this basename: AMMONIA,  in the last 168 hours CBC:  Recent Labs Lab 02/09/13 0525 02/10/13 0635 02/12/13 0445 02/14/13 0222  WBC 2.9* 3.2* 3.0* 2.9*  NEUTROABS 2.0 2.1 2.2  --   HGB 8.4* 9.2* 9.5* 9.2*  HCT 26.7* 28.9* 29.9* 29.2*  MCV 85.6 84.5 84.7 83.4  PLT 280 346 327 272   Cardiac Enzymes: No results found for this basename: CKTOTAL, CKMB, CKMBINDEX, TROPONINI,  in the last 168 hours BNP (last 3 results) No results found for this basename: PROBNP,  in the last 8760 hours CBG:  Recent Labs Lab 02/12/13 0828 02/12/13 1122  GLUCAP 142* 140*    Recent Results (from the past 240  hour(s))  URINE CULTURE     Status: None   Collection Time    02/07/13  1:00 AM      Result Value Range Status   Specimen Description URINE, CATHETERIZED   Final   Special Requests NONE   Final   Culture  Setup Time 02/08/2013 00:45   Final   Colony Count NO GROWTH   Final   Culture NO GROWTH   Final   Report Status 02/09/2013 FINAL   Final     Studies: No results found.  Scheduled Meds: . ALPRAZolam  1 mg Oral TID  . carisoprodol  350 mg Oral TID  . docusate sodium  100 mg Oral BID  . DULoxetine  30 mg Oral QPM  . enoxaparin (LOVENOX) injection  40 mg Subcutaneous Q24H  . feeding supplement  237 mL Oral QID  . ferrous sulfate  325 mg Oral BID WC  . OxyCODONE  100 mg Oral Q12H  . PHENobarbital  32.4 mg Oral Daily  . phenobarbital  64.8 mg Oral QHS   . polyethylene glycol  17 g Oral Daily  . sodium chloride  10-40 mL Intracatheter Q12H  . sodium chloride  3 mL Intravenous Q12H  . sodium chloride  3 mL Intravenous Q12H  . vancomycin  1,500 mg Intravenous Q12H   Continuous Infusions: . sodium chloride 100 mL/hr at 02/14/13 1610    Principal Problem:   Chronic osteomyelitis, other specified site Active Problems:   Seizure disorder   Hyponatremia   Chronic normocytic anemia   Depression with anxiety   Hypokalemia   S/P AKA (above knee amputation) bilateral   Colostomy status   Chronic indwelling Foley catheter   Sepsis with complicated MRSA bacteremia and candidemia   Status post fall with left 10th rib fracture and left scapular fracture   PTSD (post-traumatic stress disorder)   Decubitus ulcer of sacral region, stage 4   Chronic pain syndrome   Left hip pain   Nausea and vomiting    Time spent: > 35 minutes    Chris [name]  [facility] Pager [phone] 7PM-7AM, please contact night-coverage at [email or web address], password [facility] 02/15/2013, 12:26 PM  LOS: 9 days

## 2013-02-15 NOTE — Progress Notes (Signed)
ANTIBIOTIC CONSULT NOTE - FOLLOW UP  Pharmacy Consult for vancomycin Indication: L hip osteomyelitis  Allergies  Allergen Reactions  . Fish Allergy Anaphylaxis    Pt can tolerate shellfish  . Neurontin (Gabapentin) Other (See Comments)    "makes me violent and crazy" per patient  . Ibuprofen Other (See Comments)    hallucinations  . Ketorolac Tromethamine Other (See Comments)    hallucination  . Naproxen Other (See Comments)    hallucination    Patient Measurements: Height: 4\' 2"  (127 cm) Weight: 199 lb 4.7 oz (90.4 kg) IBW/kg (Calculated) : 27  Vital Signs: Temp: 97.8 F (36.6 C) (07/26 0535) Temp src: Oral (07/26 0535) BP: 112/60 mmHg (07/26 0535) Pulse Rate: 83 (07/26 0535) Intake/Output from previous day: 07/25 0701 - 07/26 0700 In: 2995 [P.O.:840; I.V.:2155] Out: 6550 [Urine:5300; Stool:1250] Intake/Output from this shift: Total I/O In: 240 [P.O.:240] Out: -   Labs:  Recent Labs  02/14/13 0222  WBC 2.9*  HGB 9.2*  PLT 272  CREATININE 0.51   Estimated Creatinine Clearance: 77.3 ml/min (by C-G formula based on Cr of 0.51).  Recent Labs  02/15/13 1035  VANCOTROUGH 15.8     Microbiology: Recent Results (from the past 720 hour(s))  URINE CULTURE     Status: None   Collection Time    02/07/13  1:00 AM      Result Value Range Status   Specimen Description URINE, CATHETERIZED   Final   Special Requests NONE   Final   Culture  Setup Time 02/08/2013 00:45   Final   Colony Count NO GROWTH   Final   Culture NO GROWTH   Final   Report Status 02/09/2013 FINAL   Final    Anti-infectives   Start     Dose/Rate Route Frequency Ordered Stop   02/12/13 1000  vancomycin (VANCOCIN) 1,500 mg in sodium chloride 0.9 % 500 mL IVPB     1,500 mg 250 mL/hr over 120 Minutes Intravenous Every 12 hours 02/12/13 0753     02/09/13 1800  vancomycin (VANCOCIN) 1,250 mg in sodium chloride 0.9 % 250 mL IVPB  Status:  Discontinued     1,250 mg 166.7 mL/hr over 90 Minutes  Intravenous Every 12 hours 02/09/13 0958 02/12/13 0753   02/09/13 1000  vancomycin (VANCOCIN) IVPB 1000 mg/200 mL premix     1,000 mg 200 mL/hr over 60 Minutes Intravenous NOW 02/09/13 0957 02/09/13 1111   02/07/13 1000  vancomycin (VANCOCIN) IVPB 1000 mg/200 mL premix  Status:  Discontinued     1,000 mg 200 mL/hr over 60 Minutes Intravenous Every 12 hours 02/07/13 0734 02/09/13 0957   02/07/13 0015  vancomycin (VANCOCIN) IVPB 1000 mg/200 mL premix     1,000 mg 200 mL/hr over 60 Minutes Intravenous  Once 02/07/13 0006 02/07/13 0154      Assessment: 57 y/o male who transferred from [facility] on 7/25 for management of L hip osteomyelitis who is on day 9 vancomycin. A vancomycin trough was checked this morning (drawn ~1 hr late) and is therapeutic at 15.8 on 1500 mg q12h. Currently patient is afeb, WBC are low, and urine culture was negative. Renal function is stable.  Vanc 7/18>> 7/20 trough 8.2 on 1g q12h 7/23 trough 13.2 on 1.25g q12h 7/26 trough 15.8 (drawn 1hr late) on 1.5g q12h  7/18 UCx - neg  Goal of Therapy:  Vancomycin trough level 15-20 mcg/ml  Plan:  -Continue vancomycin 1500 mg IV q12h -Trough weekly or as clinically indicated -Monitor renal  function and clinical course -F/U plans for length of therapy  [facility], [address].D., BCPS Clinical Pharmacist Pager: [phone] 02/15/2013 12:21 PM
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
## 2013-02-16 ENCOUNTER — Inpatient Hospital Stay (HOSPITAL_COMMUNITY): Payer: Medicaid Other

## 2013-02-16 DIAGNOSIS — W19XXXS Unspecified fall, sequela: Secondary | ICD-10-CM

## 2013-02-16 NOTE — Procedures (Signed)
L hip aspiration 1/2 cc bloody fluid No comp

## 2013-02-16 NOTE — Progress Notes (Signed)
[facility] PROGRESS NOTE  Chris [name] OZH:[id number] DOB: [birth year] DOA: 02/06/2013 PCP: [name], MD  Assessment/Plan: 1. Chronic Osteomyelitis of left hip - ID on board - Patient currently on Vancomycin and will continue until gram stain and cultures of the aspirate are available as recommended by ID.  2. Left hip pain/c h/o left hip fracture s/p left hip surgery and hardware several years ago.  Hip fracture - Ortho on board.  Currently plans are for no surgery - Pain control.  Will add Oxycodone IR for breakthrough pain medication  3. Seizure d/o - continue home regimen - no breakthrough seizures reported.  4. Stage IV decubitus sacral ulcer - consulted wound care nurse for further evaluation and recommendations.  Code Status: DNR Family Communication: No family at bedside. Disposition Plan: Since there is no plans for surgery soon then disposition will depend on treatment recommendations from ID for patient's chronic osteomyelitis.  - Would consider if patient were appropriate for LTAC placement given his current condition.  Consultants:  Orthopaedic surgeon  Infectious Disease  Procedures:  None  Antibiotics:  Vancomycin   HPI/Subjective: No new complaints. No acute issues reported overnight.  Objective: Filed Vitals:   02/15/13 1442 02/15/13 2037 02/16/13 0636 02/16/13 1500  BP: 97/37 104/66 104/59 99/54  Pulse: 86 81 77 84  Temp: 98.6 F (37 C) 98.6 F (37 C) 98.2 F (36.8 C) 98.6 F (37 C)  TempSrc:      Resp: 18 18 18 18   Height:      Weight:      SpO2: 99% 100% 99% 98%    Intake/Output Summary (Last 24 hours) at 02/16/13 1549 Last data filed at 02/16/13 1206  Gross per 24 hour  Intake   3820 ml  Output   2500 ml  Net   1320 ml   Filed Weights   02/10/13 0529 02/10/13 2134 02/12/13 0506  Weight: 89.3 kg (196 lb 13.9 oz) 91 kg (200 lb 9.9 oz) 90.4 kg (199 lb 4.7 oz)    Exam:   General:  Pt in NAD, Alert and  awake  Cardiovascular: RRR, no MRG  Respiratory: CTA BL, no wheezes  Abdomen: soft, NT, ND  Musculoskeletal: Pt has BL AKA, left hip edematous and painful on palpation   Data Reviewed: Basic Metabolic Panel:  Recent Labs Lab 02/10/13 0635 02/12/13 0448 02/14/13 0222  NA 133* 132*  --   K 5.2* 4.4  --   CL 95* 92*  --   CO2 31 33*  --   GLUCOSE 98 113*  --   BUN 11 11  --   CREATININE 0.43* 0.46* 0.51  CALCIUM 9.0 8.8  --    Liver Function Tests: No results found for this basename: AST, ALT, ALKPHOS, BILITOT, PROT, ALBUMIN,  in the last 168 hours No results found for this basename: LIPASE, AMYLASE,  in the last 168 hours No results found for this basename: AMMONIA,  in the last 168 hours CBC:  Recent Labs Lab 02/10/13 0635 02/12/13 0445 02/14/13 0222  WBC 3.2* 3.0* 2.9*  NEUTROABS 2.1 2.2  --   HGB 9.2* 9.5* 9.2*  HCT 28.9* 29.9* 29.2*  MCV 84.5 84.7 83.4  PLT 346 327 272   Cardiac Enzymes: No results found for this basename: CKTOTAL, CKMB, CKMBINDEX, TROPONINI,  in the last 168 hours BNP (last 3 results) No results found for this basename: PROBNP,  in the last 8760 hours CBG:  Recent Labs Lab 02/12/13 0828 02/12/13 1122  GLUCAP 142* 140*    Recent Results (from the past 240 hour(s))  URINE CULTURE     Status: None   Collection Time    02/07/13  1:00 AM      Result Value Range Status   Specimen Description URINE, CATHETERIZED   Final   Special Requests NONE   Final   Culture  Setup Time 02/08/2013 00:45   Final   Colony Count NO GROWTH   Final   Culture NO GROWTH   Final   Report Status 02/09/2013 FINAL   Final     Studies: Dg Fluoro Guide Ndl Plc/bx  02/16/2013   *RADIOLOGY REPORT*  Clinical Data: Left hip infection  LEFT HIP ASPIRATION UNDER FLUOROSCOPY  Technique:   Overlying skin prepped with Betadine, draped in the usual sterile fashion, and infiltrated locally with buffered Lidocaine. 18 gauge spinal needle advanced to the superolateral  margin of the left femoral head. Half the ccs serosanguinous fluid was aspirated.  Fluoroscopy Time: 60 seconds.  IMPRESSION: Technically successful left hip aspiration under fluoroscopy.   Original Report Authenticated By: Jolaine Click, M.D.    Scheduled Meds: . ALPRAZolam  1 mg Oral TID  . carisoprodol  350 mg Oral TID  . docusate sodium  100 mg Oral BID  . DULoxetine  30 mg Oral QPM  . enoxaparin (LOVENOX) injection  40 mg Subcutaneous Q24H  . feeding supplement  237 mL Oral QID  . ferrous sulfate  325 mg Oral BID WC  . OxyCODONE  100 mg Oral Q12H  . PHENobarbital  32.4 mg Oral Daily  . phenobarbital  64.8 mg Oral QHS  . polyethylene glycol  17 g Oral Daily  . sodium chloride  10-40 mL Intracatheter Q12H  . sodium chloride  3 mL Intravenous Q12H  . sodium chloride  3 mL Intravenous Q12H  . vancomycin  1,500 mg Intravenous Q12H   Continuous Infusions: . sodium chloride 100 mL/hr at 02/16/13 1107    Principal Problem:   Chronic osteomyelitis, other specified site Active Problems:   Seizure disorder   Hyponatremia   Chronic normocytic anemia   Depression with anxiety   Hypokalemia   S/P AKA (above knee amputation) bilateral   Colostomy status   Chronic indwelling Foley catheter   Sepsis with complicated MRSA bacteremia and candidemia   Status post fall with left 10th rib fracture and left scapular fracture   PTSD (post-traumatic stress disorder)   Decubitus ulcer of sacral region, stage 4   Chronic pain syndrome   Left hip pain   Nausea and vomiting    Time spent: > 35 minutes    Chris [name]  [facility] Pager [phone] 7PM-7AM, please contact night-coverage at [email or web address], password [facility] 02/16/2013, 3:49 PM  LOS: 10 days

## 2013-02-16 NOTE — Consult Note (Signed)
WOC consult Note Reason for Consult:Chronic Stage IV Pressure ulcers to left ischial tuberosity and sacrum. Patient has had numerous surgeries in an attempt to correct these ulcerations without success in the past. Wound type:pressure. Pressure Ulcer POA: Yes Measurement:Sacrum: 2cm x 1,5cm x 1cm with undermining at 5 o'clock measuring 3cm.  Left ischial tuberosity:  6cm x 4cm x 2.5cm  Wound bed: clean pink, non-healing.  Left ischial tuberosity is friable and bleeds easily when measured for assessment Drainage (amount, consistency, odor) moderate amount yellow exudate from both sites Periwound:intact with evidence of maceration and intertriginous skin damage secondary to accumulating moisture. Dressing procedure/placement/frequency:I will implement ab antimicrobial dressing (Aquacel Ag+ for it's silver component and absorptive capacity)onece daily.  In addition, I will provide a therapeutic mattress with a low air loss feature for pressure redistribution. Patient instructed to minimize time spent in the sitting and supine positions.  Verbalizes understanding.  WOC ostomy consult  Stoma type/location: LLQ colosotmy Stomal assessment/size: per patient report:  1 and 1/2 inch round.  No pouching supplies in room, patient says he will change pouch tomorrow if supplies provided. Peristomal assessment: not seen today.  Patient is independent in ostomy care, but is out of supplies Treatment options for stomal/peristomal skin: None indicated Output soft brown stool Ostomy pouching: 2pc. , 2 and 1/4 inch system with skin barrier ring.  Skin barrier #644, Pouch #234, Skin barrier rings [id number]. Education provided: Patient instructed to notify nursing staff when pouch is 1/3 to 1/2 full of stool or flatus so that he can be assisted in pouch emptying.  Verbalizes understanding. Staff to assist in cleaning the bottom 2 inches of pouch tail closure on pouch prior to resealing.  WOC Nursing Team will not follow,  but will remain available to this patient and his medical and nursing teams.  Please re-consult if needed. Thanks, Ladona Mow, MSN, RN, [facility], CWOCN [phone])

## 2013-02-16 NOTE — Progress Notes (Signed)
Patient ID: Chris [name], male   DOB: Jul 16, 1956, 57 y.o.   MRN: [id number]         [facility]    Date of Admission:  02/06/2013           Day 10 vancomycin Principal Problem:   Chronic osteomyelitis, other specified site Active Problems:   Seizure disorder   Hyponatremia   Chronic normocytic anemia   Depression with anxiety   Hypokalemia   S/P AKA (above knee amputation) bilateral   Colostomy status   Chronic indwelling Foley catheter   Sepsis with complicated MRSA bacteremia and candidemia   Status post fall with left 10th rib fracture and left scapular fracture   PTSD (post-traumatic stress disorder)   Decubitus ulcer of sacral region, stage 4   Chronic pain syndrome   Left hip pain   Nausea and vomiting   . ALPRAZolam  1 mg Oral TID  . carisoprodol  350 mg Oral TID  . docusate sodium  100 mg Oral BID  . DULoxetine  30 mg Oral QPM  . enoxaparin (LOVENOX) injection  40 mg Subcutaneous Q24H  . feeding supplement  237 mL Oral QID  . ferrous sulfate  325 mg Oral BID WC  . OxyCODONE  100 mg Oral Q12H  . PHENobarbital  32.4 mg Oral Daily  . phenobarbital  64.8 mg Oral QHS  . polyethylene glycol  17 g Oral Daily  . sodium chloride  10-40 mL Intracatheter Q12H  . sodium chloride  3 mL Intravenous Q12H  . sodium chloride  3 mL Intravenous Q12H  . vancomycin  1,500 mg Intravenous Q12H    Objective: Temp:  [98.2 F (36.8 C)-98.6 F (37 C)] 98.2 F (36.8 C) (07/27 0636) Pulse Rate:  [77-86] 77 (07/27 0636) Resp:  [18] 18 (07/27 0636) BP: (97-104)/(37-66) 104/59 mmHg (07/27 0636) SpO2:  [99 %-100 %] 99 % (07/27 0636)  Lab Results Lab Results  Component Value Date   WBC 2.9* 02/14/2013   HGB 9.2* 02/14/2013   HCT 29.2* 02/14/2013   MCV 83.4 02/14/2013   PLT 272 02/14/2013    Studies/Results: Dg Fluoro Guide Ndl Plc/bx  02/16/2013   *RADIOLOGY REPORT*  Clinical Data: Left hip infection  LEFT HIP ASPIRATION UNDER FLUOROSCOPY  Technique:    Overlying skin prepped with Betadine, draped in the usual sterile fashion, and infiltrated locally with buffered Lidocaine. 18 gauge spinal needle advanced to the superolateral margin of the left femoral head. Half the ccs serosanguinous fluid was aspirated.  Fluoroscopy Time: 60 seconds.  IMPRESSION: Technically successful left hip aspiration under fluoroscopy.   Original Report Authenticated By: Jolaine Click, M.D.    Assessment: Small amount of serosanguineous fluid was aspirated from his left hip. Gram stain is pending.  Plan: 1. Continue vancomycin pending Gram stain and cultures of the aspirate  Cliffton Asters, MD [facility] [phone] pager   [phone] cell 02/16/2013, 2:24 PM

## 2013-02-17 DIAGNOSIS — J9600 Acute respiratory failure, unspecified whether with hypoxia or hypercapnia: Secondary | ICD-10-CM

## 2013-02-17 LAB — BASIC METABOLIC PANEL
CO2: 34 mEq/L — ABNORMAL HIGH (ref 19–32)
Chloride: 96 mEq/L (ref 96–112)
Creatinine, Ser: 0.47 mg/dL — ABNORMAL LOW (ref 0.50–1.35)

## 2013-02-17 NOTE — Progress Notes (Signed)
[facility] PROGRESS NOTE  Chris [name] ZOX:[id number] DOB: 1956-04-19 DOA: 02/06/2013 PCP: [name], MD  Assessment/Plan: 1. Chronic Osteomyelitis of left hip - ID on board and currently awaiting results of culture. - Patient currently on Vancomycin and will continue as per recommendations from ID  2. Left hip pain/c h/o left hip fracture s/p left hip surgery and hardware several years ago.  Hip fracture - Ortho on board.  Currently plans are for no immediate surgery - Pain control.  Will add Oxycodone IR for breakthrough pain medication  3. Seizure d/o - continue home regimen - no breakthrough seizures reported.  4. Stage IV decubitus sacral ulcer - consulted wound care nurse for further evaluation and recommendations.  Code Status: DNR Family Communication: No family at bedside. Disposition Plan:  At this point awaiting culture results and ID recommendations. From Ortho standpoint no need for urgent operation.   Consultants:  Orthopaedic surgeon  Infectious Disease  Procedures:  None  Antibiotics:  Vancomycin   HPI/Subjective: No new complaints. No acute issues reported overnight.  Patient currently thinking about whether he want to go through another operation to remove suspected necrotic femoral head causing pain.  Objective: Filed Vitals:   02/16/13 1500 02/16/13 2211 02/17/13 0654 02/17/13 1415  BP: 99/54 115/57 103/54 113/55  Pulse: 84 90 75 71  Temp: 98.6 F (37 C) 99.7 F (37.6 C) 97.7 F (36.5 C) 98.7 F (37.1 C)  TempSrc:      Resp: 18 18 18 20   Height:      Weight:      SpO2: 98% 98% 98% 96%    Intake/Output Summary (Last 24 hours) at 02/17/13 1555 Last data filed at 02/17/13 1416  Gross per 24 hour  Intake   4900 ml  Output   5300 ml  Net   -400 ml   Filed Weights   02/10/13 0529 02/10/13 2134 02/12/13 0506  Weight: 89.3 kg (196 lb 13.9 oz) 91 kg (200 lb 9.9 oz) 90.4 kg (199 lb 4.7 oz)    Exam:   General:  Pt in  NAD, Alert and awake  Cardiovascular: RRR, no MRG  Respiratory: CTA BL, no wheezes  Abdomen: soft, NT, ND  Musculoskeletal: Pt has BL AKA  Data Reviewed: Basic Metabolic Panel:  Recent Labs Lab 02/12/13 0448 02/14/13 0222 02/17/13 0500  NA 132*  --  135  K 4.4  --  4.4  CL 92*  --  96  CO2 33*  --  34*  GLUCOSE 113*  --  101*  BUN 11  --  12  CREATININE 0.46* 0.51 0.47*  CALCIUM 8.8  --  8.7   Liver Function Tests: No results found for this basename: AST, ALT, ALKPHOS, BILITOT, PROT, ALBUMIN,  in the last 168 hours No results found for this basename: LIPASE, AMYLASE,  in the last 168 hours No results found for this basename: AMMONIA,  in the last 168 hours CBC:  Recent Labs Lab 02/12/13 0445 02/14/13 0222  WBC 3.0* 2.9*  NEUTROABS 2.2  --   HGB 9.5* 9.2*  HCT 29.9* 29.2*  MCV 84.7 83.4  PLT 327 272   Cardiac Enzymes: No results found for this basename: CKTOTAL, CKMB, CKMBINDEX, TROPONINI,  in the last 168 hours BNP (last 3 results) No results found for this basename: PROBNP,  in the last 8760 hours CBG:  Recent Labs Lab 02/12/13 0828 02/12/13 1122  GLUCAP 142* 140*    Recent Results (from the past 240 hour(s))  BODY FLUID CULTURE     Status: None   Collection Time    02/16/13 12:52 PM      Result Value Range Status   Specimen Description FLUID SYNOVIAL LEFT HIP   Final   Special Requests FROM THE JOINT   Final   Gram Stain     Final   Value: NO WBC SEEN     NO ORGANISMS SEEN   Culture NO GROWTH 1 DAY   Final   Report Status PENDING   Incomplete     Studies: Dg Fluoro Guide Ndl Plc/bx  02/16/2013   *RADIOLOGY REPORT*  Clinical Data: Left hip infection  LEFT HIP ASPIRATION UNDER FLUOROSCOPY  Technique:   Overlying skin prepped with Betadine, draped in the usual sterile fashion, and infiltrated locally with buffered Lidocaine. 18 gauge spinal needle advanced to the superolateral margin of the left femoral head. Half the ccs serosanguinous fluid  was aspirated.  Fluoroscopy Time: 60 seconds.  IMPRESSION: Technically successful left hip aspiration under fluoroscopy.   Original Report Authenticated By: Jolaine Click, M.D.    Scheduled Meds: . ALPRAZolam  1 mg Oral TID  . carisoprodol  350 mg Oral TID  . docusate sodium  100 mg Oral BID  . DULoxetine  30 mg Oral QPM  . enoxaparin (LOVENOX) injection  40 mg Subcutaneous Q24H  . feeding supplement  237 mL Oral QID  . ferrous sulfate  325 mg Oral BID WC  . OxyCODONE  100 mg Oral Q12H  . PHENobarbital  32.4 mg Oral Daily  . phenobarbital  64.8 mg Oral QHS  . polyethylene glycol  17 g Oral Daily  . sodium chloride  10-40 mL Intracatheter Q12H  . sodium chloride  3 mL Intravenous Q12H  . sodium chloride  3 mL Intravenous Q12H  . vancomycin  1,500 mg Intravenous Q12H   Continuous Infusions: . sodium chloride 100 mL/hr at 02/17/13 0600    Principal Problem:   Chronic osteomyelitis, other specified site Active Problems:   Seizure disorder   Hyponatremia   Chronic normocytic anemia   Depression with anxiety   Hypokalemia   S/P AKA (above knee amputation) bilateral   Colostomy status   Chronic indwelling Foley catheter   Sepsis with complicated MRSA bacteremia and candidemia   Status post fall with left 10th rib fracture and left scapular fracture   PTSD (post-traumatic stress disorder)   Decubitus ulcer of sacral region, stage 4   Chronic pain syndrome   Left hip pain   Nausea and vomiting    Time spent: > 35 minutes    Penny Pia  [facility] Pager [phone] 7PM-7AM, please contact night-coverage at [email or web address], password [facility] 02/17/2013, 3:55 PM  LOS: 11 days

## 2013-02-17 NOTE — Progress Notes (Signed)
Patient ID: Chris [name], male   DOB: 1955/11/01, 57 y.o.   MRN: [id number]         [facility]    Date of Admission:  02/06/2013           Day 11 vancomycin Principal Problem:   Chronic osteomyelitis, other specified site Active Problems:   Seizure disorder   Hyponatremia   Chronic normocytic anemia   Depression with anxiety   Hypokalemia   S/P AKA (above knee amputation) bilateral   Colostomy status   Chronic indwelling Foley catheter   Sepsis with complicated MRSA bacteremia and candidemia   Status post fall with left 10th rib fracture and left scapular fracture   PTSD (post-traumatic stress disorder)   Decubitus ulcer of sacral region, stage 4   Chronic pain syndrome   Left hip pain   Nausea and vomiting  Objective: Temp:  [97.7 F (36.5 C)-99.7 F (37.6 C)] 98.7 F (37.1 C) (07/28 1415) Pulse Rate:  [71-90] 71 (07/28 1415) Resp:  [18-20] 20 (07/28 1415) BP: (103-115)/(54-57) 113/55 mmHg (07/28 1415) SpO2:  [96 %-98 %] 96 % (07/28 1415)  Lab Results Lab Results  Component Value Date   WBC 2.9* 02/14/2013   HGB 9.2* 02/14/2013   HCT 29.2* 02/14/2013   MCV 83.4 02/14/2013   PLT 272 02/14/2013    Lab Results  Component Value Date   CREATININE 0.47* 02/17/2013   BUN 12 02/17/2013   NA 135 02/17/2013   K 4.4 02/17/2013   CL 96 02/17/2013   CO2 34* 02/17/2013      Microbiology: Recent Results (from the past 240 hour(s))  BODY FLUID CULTURE     Status: None   Collection Time    02/16/13 12:52 PM      Result Value Range Status   Specimen Description FLUID SYNOVIAL LEFT HIP   Final   Special Requests FROM THE JOINT   Final   Gram Stain     Final   Value: NO WBC SEEN     NO ORGANISMS SEEN   Culture NO GROWTH 1 DAY   Final   Report Status PENDING   Incomplete   Assessment: His left hip aspirate is no growth at 24 hours but can easily be falsely negative due to prior vancomycin therapy. If he has chronic osteomyelitis of that left hip  is very unlikely that he could never be cured without extensive surgery.  Plan: 1. Continue vancomycin for now 2. Await results of culture 3. I will followup tomorrow  Cliffton Asters, MD [facility] [phone] pager   [phone] cell 02/17/2013, 3:12 PM

## 2013-02-17 NOTE — Progress Notes (Signed)
Patient ID: Chris [name], male   DOB: March 19, 1956, 57 y.o.   MRN: [id number] Mr. Beaupre continues to remain afebrile with a normal WBC.  His left hip joint aspiration only pulled off 1/2 cc of fluid, showed no WBC's, and no organisms thus far ( not consistent with an acute process).  He reports his pain is worse, however, clinically, does not exhibit objective signs of worsening pain.  His left AKA stump is not swollen and there is no redness. His vitals are stable. He is not septic appearing.  I can move his hip around with some pain.  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  He is not a candidate for a total hip replacement given his other medical problems and sacral decubs.  I talked to him in great length about this and described the difficulty of surgery.  This is something he would like to think about.  There is no urgency to taking out the hardware and eventually the femoral head.  I do feel that his chronic pain and low pain thresh-hold contributes to this as well.

## 2013-02-18 DIAGNOSIS — S78119A Complete traumatic amputation at level between unspecified hip and knee, initial encounter: Secondary | ICD-10-CM
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
## 2013-02-18 NOTE — Progress Notes (Signed)
ANTIBIOTIC CONSULT NOTE - FOLLOW UP  Pharmacy Consult for vancomycin Indication: L hip osteomyelitis  Allergies  Allergen Reactions  . Fish Allergy Anaphylaxis    Pt can tolerate shellfish  . Neurontin (Gabapentin) Other (See Comments)    "makes me violent and crazy" per patient  . Ibuprofen Other (See Comments)    hallucinations  . Ketorolac Tromethamine Other (See Comments)    hallucination  . Naproxen Other (See Comments)    hallucination    Patient Measurements: Height: 4\' 2"  (127 cm) Weight: 199 lb 4.7 oz (90.4 kg) IBW/kg (Calculated) : 27  Vital Signs: Temp: 98.2 F (36.8 C) (07/29 0543) BP: 101/43 mmHg (07/29 0543) Pulse Rate: 77 (07/29 0543) Intake/Output from previous day: 07/28 0701 - 07/29 0700 In: 4336.7 [P.O.:1920; I.V.:2416.7] Out: 5200 [Urine:5200] Intake/Output from this shift:    Labs:  Recent Labs  02/17/13 0500  CREATININE 0.47*   Estimated Creatinine Clearance: 77.3 ml/min (by C-G formula based on Cr of 0.47).  Recent Labs  02/15/13 1035  VANCOTROUGH 15.8     Microbiology: Recent Results (from the past 720 hour(s))  URINE CULTURE     Status: None   Collection Time    02/07/13  1:00 AM      Result Value Range Status   Specimen Description URINE, CATHETERIZED   Final   Special Requests NONE   Final   Culture  Setup Time 02/08/2013 00:45   Final   Colony Count NO GROWTH   Final   Culture NO GROWTH   Final   Report Status 02/09/2013 FINAL   Final  BODY FLUID CULTURE     Status: None   Collection Time    02/16/13 12:52 PM      Result Value Range Status   Specimen Description FLUID SYNOVIAL LEFT HIP   Final   Special Requests FROM THE JOINT   Final   Gram Stain     Final   Value: NO WBC SEEN     NO ORGANISMS SEEN   Culture NO GROWTH 1 DAY   Final   Report Status PENDING   Incomplete    Anti-infectives   Start     Dose/Rate Route Frequency Ordered Stop   02/12/13 1000  vancomycin (VANCOCIN) 1,500 mg in sodium chloride 0.9 %  500 mL IVPB     1,500 mg 250 mL/hr over 120 Minutes Intravenous Every 12 hours 02/12/13 0753     02/09/13 1800  vancomycin (VANCOCIN) 1,250 mg in sodium chloride 0.9 % 250 mL IVPB  Status:  Discontinued     1,250 mg 166.7 mL/hr over 90 Minutes Intravenous Every 12 hours 02/09/13 0958 02/12/13 0753   02/09/13 1000  vancomycin (VANCOCIN) IVPB 1000 mg/200 mL premix     1,000 mg 200 mL/hr over 60 Minutes Intravenous NOW 02/09/13 0957 02/09/13 1111   02/07/13 1000  vancomycin (VANCOCIN) IVPB 1000 mg/200 mL premix  Status:  Discontinued     1,000 mg 200 mL/hr over 60 Minutes Intravenous Every 12 hours 02/07/13 0734 02/09/13 0957   02/07/13 0015  vancomycin (VANCOCIN) IVPB 1000 mg/200 mL premix     1,000 mg 200 mL/hr over 60 Minutes Intravenous  Once 02/07/13 0006 02/07/13 0154      Assessment: 57 y/o male who transferred from [facility] on 7/25 for management of L hip osteomyelitis who is on day 12 vancomycin. Pt is currently afebrile and WBC was low as of 7/25. Dose is appropriate based on last trough drawn.   Vanc 7/18>>  7/20 trough 8.2 on 1g q12h 7/23 trough 13.2 on 1.25g q12h 7/26 trough 15.8 (drawn 1hr late) on 1.5g q12h  7/18 UCx - NEG 7/27 L hip fluid - NGTD  Goal of Therapy:  Vancomycin trough level 15-20 mcg/ml  Plan:  -Continue vancomycin 1500 mg IV q12h -Trough weekly (next due 8/2) or as clinically indicated -Monitor renal function and clinical course -F/u ID recommendations  Lysle Pearl, PharmD, BCPS Pager # [phone] 02/18/2013 10:31 AM

## 2013-02-18 NOTE — Progress Notes (Signed)
Patient ID: Chris [name], male   DOB: Apr 21, 1956, 57 y.o.   MRN: [id number] He reports that his left hip pain has gotten a little better over the past 24 hours.  He still appears comfortable, has has stable vitals, and no fevers.  His left hip aspirate has had no growth over the past 2 days, but more importantly, showed no WBC's and was a very scant amount of fluid found as well.  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  His recent fall probably accelerated things.  He still may have a component of chronic infection due to the sacral wounds.  This is still a very complicated situation.  I think the only way to get adequate pain relief would be to remove his hardware and his femoral head both to treat pain and the possibility of infection.  However, operating is not without signiicfant difficulties as well.  This will be an extremely difficult surgery from a technical standpoint and even from a healing standpoint.  I still do not see an urgent need to proceed to surgery.  If he does desire to have surgery, which he does not at the moment, it will be quite an undertaking from a scheduling standpoint as well and will need to be done during the early evening hours due to the OR schedule in general.

## 2013-02-19 DIAGNOSIS — M86659 Other chronic osteomyelitis, unspecified thigh: Principal | ICD-10-CM

## 2013-02-19 LAB — BASIC METABOLIC PANEL
CO2: 28 mEq/L (ref 19–32)
Calcium: 8.3 mg/dL — ABNORMAL LOW (ref 8.4–10.5)
Glucose, Bld: 105 mg/dL — ABNORMAL HIGH (ref 70–99)
Potassium: 4.1 mEq/L (ref 3.5–5.1)
Sodium: 134 mEq/L — ABNORMAL LOW (ref 135–145)

## 2013-02-19 NOTE — Progress Notes (Signed)
Patient ID: Chris [name], male   DOB: 04-28-1956, 57 y.o.   MRN: [id number]         [facility]    Date of Admission:  02/06/2013           Day 13 vancomycin Principal Problem:   Chronic osteomyelitis, other specified site Active Problems:   Seizure disorder   Hyponatremia   Chronic normocytic anemia   Depression with anxiety   Hypokalemia   S/P AKA (above knee amputation) bilateral   Colostomy status   Chronic indwelling Foley catheter   Sepsis with complicated MRSA bacteremia and candidemia   Status post fall with left 10th rib fracture and left scapular fracture   PTSD (post-traumatic stress disorder)   Decubitus ulcer of sacral region, stage 4   Chronic pain syndrome   Left hip pain   Nausea and vomiting   . ALPRAZolam  1 mg Oral TID  . carisoprodol  350 mg Oral TID  . docusate sodium  100 mg Oral BID  . DULoxetine  30 mg Oral QPM  . enoxaparin (LOVENOX) injection  40 mg Subcutaneous Q24H  . feeding supplement  237 mL Oral QID  . ferrous sulfate  325 mg Oral BID WC  . OxyCODONE  100 mg Oral Q12H  . PHENobarbital  32.4 mg Oral Daily  . phenobarbital  64.8 mg Oral QHS  . polyethylene glycol  17 g Oral Daily  . sodium chloride  10-40 mL Intracatheter Q12H  . sodium chloride  3 mL Intravenous Q12H  . sodium chloride  3 mL Intravenous Q12H  . vancomycin  1,500 mg Intravenous Q12H    Subjective: He feels like left hip pain has improved slightly.  Objective: Temp:  [98.7 F (37.1 C)-99.6 F (37.6 C)] 98.7 F (37.1 C) (07/30 1433) Pulse Rate:  [88-92] 88 (07/30 1433) Resp:  [16-17] 17 (07/30 1433) BP: (105-117)/(49-58) 105/54 mmHg (07/30 1433) SpO2:  [98 %-99 %] 98 % (07/30 1433)  General: Alert and comfortable He continues to have diffuse swelling and tenderness over her entire left hip  Lab Results Lab Results  Component Value Date   WBC 2.9* 02/14/2013   HGB 9.2* 02/14/2013   HCT 29.2* 02/14/2013   MCV 83.4 02/14/2013   PLT  272 02/14/2013    Lab Results  Component Value Date   CREATININE 0.46* 02/19/2013   BUN 10 02/19/2013   NA 134* 02/19/2013   K 4.1 02/19/2013   CL 97 02/19/2013   CO2 28 02/19/2013    Lab Results  Component Value Date   ALT 13 02/08/2013   AST 17 02/08/2013   ALKPHOS 160* 02/08/2013   BILITOT 0.2* 02/08/2013      Microbiology: Recent Results (from the past 240 hour(s))  BODY FLUID CULTURE     Status: None   Collection Time    02/16/13 12:52 PM      Result Value Range Status   Specimen Description FLUID SYNOVIAL LEFT HIP   Final   Special Requests FROM THE JOINT   Final   Gram Stain     Final   Value: NO WBC SEEN     NO ORGANISMS SEEN   Culture NO GROWTH 3 DAYS   Final   Report Status PENDING   Incomplete    Studies/Results: No results found.  Assessment:  I believe the only way to really know if he has infection and to manage appropriately he is with surgery. I do suspect he has  chronic osteomyelitis and it is extremely unlikely that it will be curable with antibiotic therapy alone. He does not seem to recall much of his discussions with Dr. Magnus Ivan but does seem amenable to considering surgery.   Plan: 1. Continue vancomycin  2. Await decision about surgery  Cliffton Asters, MD [facility] [phone] pager   [phone] cell 02/19/2013, 3:01 PM

## 2013-02-19 NOTE — Progress Notes (Signed)
Patient ID: Chris [name], male   DOB: Jun 12, 1956, 57 y.o.   MRN: [id number] Although Dr. Blair Dolphin note suggests that the patient does not recall much of the multiple conversations that I have had with him about his left hip and the difficult situation he is in, when i talked to him today, he states that he is fully aware of everything I have explained to him.  I have spent time explaining everything in great detail.  It still seems that ID feels that surgery is the main option to determine whether or not this is an infectious process.  Although this will be very difficult getting this particular hardware out of his body and will be quite complicated, he now wishes to proceed with surgery.  I truly feel that he will still have pain even with taking the hardware out that has been in for greater than 5 years.  Leaving a femoral head in with such significant osteonecrosis will still cause pain.  I do not plan on taking out his femoral head at all for this surgery and will focus on just getting out the hardware and hopefully a sample of bone that will lead to a diagnosis.  He seems to understand the risks of surgery and this could impact his life greatly due to his medical problems.  There is certainly a risk of significant blood loss due to the difficulties inherent in taking out hardware like this.  There is also complications involving is heart, lungs, wound issues, etc.  He still is non-septic appearing and looks comfortable.    I am not comfortable performing this surgery at night, but do not have time available during the day to perform this extensive surgery.  I will try to get him on the schedule this coming Saturday to take out his hardware and get a sample of his femoral head if possible.  Hopefully the Primary Team and ID Consult understands fully my concerns about how difficult this will be.

## 2013-02-19 NOTE — Progress Notes (Signed)
[facility] PROGRESS NOTE  Chris [name] GEX:[id number] DOB: 13-Feb-1956 DOA: 02/06/2013 PCP: Chris Stalling, MD  Assessment/Plan: 1. Chronic Osteomyelitis of left hip - ID on board and currently awaiting results of culture.  Awaiting recommendations regarding long term antibiotic regimen moving forward. Based on last note from ortho on 7/29 patient will require an extensive hip surgery which will be technically difficult. -patient is amenable for procedure  -will follow further recommendations  2. Left hip pain/c h/o left hip fracture s/p left hip surgery and hardware several years ago.   - Ortho on board.  Patient might need surgery and hardware removal - Pain control.  Will continue Oxycodone IR for breakthrough pain medication - continue IV antibiotics as dictated by ID  3. Seizure d/o -Stable -continue home regimen -no breakthrough seizures reported.  4. Stage IV decubitus sacral ulcer -continue vancomycin (which would cover infection) -continue wound care  Code Status: DNR Family Communication: No family at bedside. Disposition Plan:  If no operation planned then would await long term antibiotic recommendations from ID and discharge soon.    Consultants:  Orthopaedic surgeon  Infectious Disease  Procedures:  None  Antibiotics:  Vancomycin   HPI/Subjective: No new complaints. Pain well controlled. Discussion about importance of removing hardware and have surgery if infections is present as suspected sustained with patient. He is in agreement to discuss details with surgery and have procedure done.   Objective: Filed Vitals:   02/18/13 1300 02/18/13 2241 02/19/13 0633 02/19/13 1433  BP: 114/50 107/49 117/58 105/54  Pulse: 80 90 92 88  Temp: 97.6 F (36.4 C) 99.6 F (37.6 C) 98.8 F (37.1 C) 98.7 F (37.1 C)  TempSrc:      Resp: 17 16 16 17   Height:      Weight:      SpO2: 98% 99% 99% 98%    Intake/Output Summary (Last 24 hours) at 02/19/13  1545 Last data filed at 02/19/13 1435  Gross per 24 hour  Intake 2548.33 ml  Output   5100 ml  Net -2551.67 ml   Filed Weights   02/10/13 0529 02/10/13 2134 02/12/13 0506  Weight: 89.3 kg (196 lb 13.9 oz) 91 kg (200 lb 9.9 oz) 90.4 kg (199 lb 4.7 oz)    Exam:   General:  Pt in NAD, Alert and awake  Cardiovascular: RRR, no MRG  Respiratory: CTA BL, no wheezes  Abdomen: soft, NT, ND  Musculoskeletal: Pt has BL AKA  Data Reviewed: Basic Metabolic Panel:  Recent Labs Lab 02/14/13 0222 02/17/13 0500 02/19/13 0500  NA  --  135 134*  K  --  4.4 4.1  CL  --  96 97  CO2  --  34* 28  GLUCOSE  --  101* 105*  BUN  --  12 10  CREATININE 0.51 0.47* 0.46*  CALCIUM  --  8.7 8.3*   CBC:  Recent Labs Lab 02/14/13 0222  WBC 2.9*  HGB 9.2*  HCT 29.2*  MCV 83.4  PLT 272    Recent Results (from the past 240 hour(s))  BODY FLUID CULTURE     Status: None   Collection Time    02/16/13 12:52 PM      Result Value Range Status   Specimen Description FLUID SYNOVIAL LEFT HIP   Final   Special Requests FROM THE JOINT   Final   Gram Stain     Final   Value: NO WBC SEEN     NO ORGANISMS SEEN  Culture NO GROWTH 3 DAYS   Final   Report Status PENDING   Incomplete     Studies: No results found.  Scheduled Meds: . ALPRAZolam  1 mg Oral TID  . carisoprodol  350 mg Oral TID  . docusate sodium  100 mg Oral BID  . DULoxetine  30 mg Oral QPM  . enoxaparin (LOVENOX) injection  40 mg Subcutaneous Q24H  . feeding supplement  237 mL Oral QID  . ferrous sulfate  325 mg Oral BID WC  . OxyCODONE  100 mg Oral Q12H  . PHENobarbital  32.4 mg Oral Daily  . phenobarbital  64.8 mg Oral QHS  . polyethylene glycol  17 g Oral Daily  . sodium chloride  10-40 mL Intracatheter Q12H  . sodium chloride  3 mL Intravenous Q12H  . sodium chloride  3 mL Intravenous Q12H  . vancomycin  1,500 mg Intravenous Q12H   Continuous Infusions: . sodium chloride 100 mL/hr at 02/19/13 1214    Principal  Problem:   Chronic osteomyelitis, other specified site Active Problems:   Seizure disorder   Hyponatremia   Chronic normocytic anemia   Depression with anxiety   Hypokalemia   S/P AKA (above knee amputation) bilateral   Colostomy status   Chronic indwelling Foley catheter   Sepsis with complicated MRSA bacteremia and candidemia   Status post fall with left 10th rib fracture and left scapular fracture   PTSD (post-traumatic stress disorder)   Decubitus ulcer of sacral region, stage 4   Chronic pain syndrome   Left hip pain   Nausea and vomiting    Time spent: > 30 minutes    Chris [name]  [facility] Pager [phone] If 7PM-7AM, please contact night-coverage at [email or web address], password [facility] 02/19/2013, 3:45 PM  LOS: 13 days

## 2013-02-20 DIAGNOSIS — T889XXS Complication of surgical and medical care, unspecified, sequela: Secondary | ICD-10-CM

## 2013-02-20 LAB — BODY FLUID CULTURE: Culture: NO GROWTH

## 2013-02-20 MED ORDER — HYDROMORPHONE HCL PF 1 MG/ML IJ SOLN
3.0000 mg | INTRAMUSCULAR | Status: DC | PRN
  Administered 2013-02-20 – 2013-02-25 (×35): 3 mg via INTRAVENOUS
  Filled 2013-02-20 (×37): qty 3
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
## 2013-02-20 NOTE — Progress Notes (Signed)
SW is continuing to follow patient and will assist with all discharge needs.   Sabino Niemann, MSW, [phone]

## 2013-02-20 NOTE — Progress Notes (Signed)
NUTRITION FOLLOW UP  Intervention:   Continue Ensure Complete po QID, each supplement provides 350 kcal and 13 grams of protein.   NUTRITION DIAGNOSIS:  Increased protein-energy needs related to wound healing as evidenced by chronic pressure ulcers right/left buttocks; ongoing.   Goal:  Pt to meet >/= 90% of their estimated nutrition needs; not met.   Monitor:  Diet advancement, po intake, labs and wt trends  Assessment:   Pt with hx of multiple medical problems including malnutrition, chronic decubitus ulcers. S/p bilateral AKA.  Pt to have surgery on Saturday for possible chronic osteomyelitis. Per pt his appetite is poor due to nausea. Pt is drinking ensure but not as much as he was.  Pt seen during wound dressing change today.   Height: Ht Readings from Last 1 Encounters:  02/07/13 4\' 2"  (1.27 m)   Per past medical records pt was 6'1" prior to bilateral AKA.   Weight Status:   Wt Readings from Last 1 Encounters:  02/12/13 199 lb 4.7 oz (90.4 kg)  Adjusted IBW: 73.7 kg  Adjusted BMI: 29.8  Re-estimated needs:  Kcal: 1900-2100 Protein: 90-115 grams Fluid: > 2 L/day  Skin: multiple stage IV decubitus ulcers, chronic  Diet Order: Parke Simmers   Intake/Output Summary (Last 24 hours) at 02/20/13 1102 Last data filed at 02/20/13 0900  Gross per 24 hour  Intake   2520 ml  Output   4600 ml  Net  -2080 ml    Last BM: via diverting colostomy 300 ml 7/24 450 ml and 1 unmeasured 7/23   Labs:   Recent Labs Lab 02/14/13 0222 02/17/13 0500 02/19/13 0500  NA  --  135 134*  K  --  4.4 4.1  CL  --  96 97  CO2  --  34* 28  BUN  --  12 10  CREATININE 0.51 0.47* 0.46*  CALCIUM  --  8.7 8.3*  GLUCOSE  --  101* 105*    CBG (last 3)  No results found for this basename: GLUCAP,  in the last 72 hours  Scheduled Meds: . ALPRAZolam  1 mg Oral TID  . carisoprodol  350 mg Oral TID  . docusate sodium  100 mg Oral BID  . DULoxetine  30 mg Oral QPM  . enoxaparin (LOVENOX)  injection  40 mg Subcutaneous Q24H  . feeding supplement  237 mL Oral QID  . ferrous sulfate  325 mg Oral BID WC  . OxyCODONE  100 mg Oral Q12H  . PHENobarbital  32.4 mg Oral Daily  . phenobarbital  64.8 mg Oral QHS  . polyethylene glycol  17 g Oral Daily  . sodium chloride  10-40 mL Intracatheter Q12H  . sodium chloride  3 mL Intravenous Q12H  . sodium chloride  3 mL Intravenous Q12H    Continuous Infusions: . sodium chloride 100 mL/hr at 02/19/13 2206    [facility] RD, LDN, CNSC [phone] Pager [phone] After Hours Pager

## 2013-02-20 NOTE — Progress Notes (Signed)
[facility] PROGRESS NOTE  Chris [name] DGL:[id number] DOB: 1955/09/09 DOA: 02/06/2013 PCP: [name], MD  Assessment/Plan: 1. Chronic Osteomyelitis of left hip - ID on board and currently awaiting results of culture.  Awaiting recommendations regarding long term antibiotic regimen moving forward. Based on last note from ortho on 7/29 patient will require an extensive hip surgery which will be technically difficult. -patient is amenable for procedure and Plan is for surgery on Saturday (02/22/13) for hardware removal and bone biopsy -will follow further recommendations  2. Left hip pain/c h/o left hip fracture s/p left hip surgery and hardware several years ago.   - Ortho on board.  Plan is for surgery on Saturday (02/22/13) for hardware removal and bone biopsy - Pain control.  Will continue Oxycodone IR for breakthrough pain medication - continue IV antibiotics as dictated by ID; currently holding vanc to yield more chances of positive cx's on anticipated biopsy  3. Seizure d/o -Stable -continue home regimen -no seizures activity reported.  4. Stage IV decubitus sacral ulcer -vancomycin discontinue today per ID in order to increase chances of positive cx's in anticipated biopsy on 02/22/13 -continue wound care and preventive measures  Code Status: DNR Family Communication: No family at bedside. Disposition Plan:  If no operation planned then would await long term antibiotic recommendations from ID and discharge soon.    Consultants:  Orthopaedic surgeon  Infectious Disease  Procedures:  None  Antibiotics:  Vancomycin 7/17--7/31 (completed 14 days)  HPI/Subjective: No new complaints. Pain well controlled. Plan is for surgery on Saturday (02/22/13) for hardware removal and bone biopsy.  Objective: Filed Vitals:   02/19/13 0633 02/19/13 1433 02/20/13 0009 02/20/13 0503  BP: 117/58 105/54 134/65 130/77  Pulse: 92 88 86 84  Temp: 98.8 F (37.1 C) 98.7 F (37.1  C) 98.8 F (37.1 C) 98.7 F (37.1 C)  TempSrc:   Oral Oral  Resp: 16 17 16 16   Height:      Weight:      SpO2: 99% 98% 97% 96%    Intake/Output Summary (Last 24 hours) at 02/20/13 1113 Last data filed at 02/20/13 0900  Gross per 24 hour  Intake   2520 ml  Output   4600 ml  Net  -2080 ml   Filed Weights   02/10/13 0529 02/10/13 2134 02/12/13 0506  Weight: 89.3 kg (196 lb 13.9 oz) 91 kg (200 lb 9.9 oz) 90.4 kg (199 lb 4.7 oz)    Exam:   General:  Pt in NAD, Alert and awake; remains afebrile  Cardiovascular: RRR, no MRG  Respiratory: CTA BL, no wheezes  Abdomen: soft, NT, ND  Musculoskeletal: Pt has BL AKA  Data Reviewed: Basic Metabolic Panel:  Recent Labs Lab 02/14/13 0222 02/17/13 0500 02/19/13 0500  NA  --  135 134*  K  --  4.4 4.1  CL  --  96 97  CO2  --  34* 28  GLUCOSE  --  101* 105*  BUN  --  12 10  CREATININE 0.51 0.47* 0.46*  CALCIUM  --  8.7 8.3*   CBC:  Recent Labs Lab 02/14/13 0222  WBC 2.9*  HGB 9.2*  HCT 29.2*  MCV 83.4  PLT 272    Recent Results (from the past 240 hour(s))  BODY FLUID CULTURE     Status: None   Collection Time    02/16/13 12:52 PM      Result Value Range Status   Specimen Description FLUID SYNOVIAL LEFT HIP  Final   Special Requests FROM THE JOINT   Final   Gram Stain     Final   Value: NO WBC SEEN     NO ORGANISMS SEEN   Culture NO GROWTH 3 DAYS   Final   Report Status PENDING   Incomplete     Studies: No results found.  Scheduled Meds: . ALPRAZolam  1 mg Oral TID  . carisoprodol  350 mg Oral TID  . docusate sodium  100 mg Oral BID  . DULoxetine  30 mg Oral QPM  . enoxaparin (LOVENOX) injection  40 mg Subcutaneous Q24H  . feeding supplement  237 mL Oral QID  . ferrous sulfate  325 mg Oral BID WC  . OxyCODONE  100 mg Oral Q12H  . PHENobarbital  32.4 mg Oral Daily  . phenobarbital  64.8 mg Oral QHS  . polyethylene glycol  17 g Oral Daily  . sodium chloride  10-40 mL Intracatheter Q12H  .  sodium chloride  3 mL Intravenous Q12H  . sodium chloride  3 mL Intravenous Q12H   Continuous Infusions: . sodium chloride 100 mL/hr at 02/19/13 2206    Principal Problem:   Chronic osteomyelitis, other specified site Active Problems:   Seizure disorder   Hyponatremia   Chronic normocytic anemia   Depression with anxiety   Hypokalemia   S/P AKA (above knee amputation) bilateral   Colostomy status   Chronic indwelling Foley catheter   Sepsis with complicated MRSA bacteremia and candidemia   Status post fall with left 10th rib fracture and left scapular fracture   PTSD (post-traumatic stress disorder)   Decubitus ulcer of sacral region, stage 4   Chronic pain syndrome   Left hip pain   Nausea and vomiting    Time spent: > 30 minutes    Amorita Vanrossum  [facility] Pager [phone] If 7PM-7AM, please contact night-coverage at [email or web address], password [facility] 02/20/2013, 11:13 AM  LOS: 14 days

## 2013-02-21 DIAGNOSIS — S88119A Complete traumatic amputation at level between knee and ankle, unspecified lower leg, initial encounter: Secondary | ICD-10-CM

## 2013-02-21 DIAGNOSIS — Z5189 Encounter for other specified aftercare: Secondary | ICD-10-CM

## 2013-02-21 DIAGNOSIS — F341 Dysthymic disorder: Secondary | ICD-10-CM

## 2013-02-21 LAB — CBC
Platelets: 183 10*3/uL (ref 150–400)
RBC: 2.92 MIL/uL — ABNORMAL LOW (ref 4.22–5.81)
WBC: 2.6 10*3/uL — ABNORMAL LOW (ref 4.0–10.5)

## 2013-02-21 LAB — BASIC METABOLIC PANEL
CO2: 27 mEq/L (ref 19–32)
Chloride: 98 mEq/L (ref 96–112)
Potassium: 3.6 mEq/L (ref 3.5–5.1)
Sodium: 135 mEq/L (ref 135–145)

## 2013-02-21 LAB — HIV-1 RNA QUANT-NO REFLEX-BLD: HIV-1 RNA Quant, Log: <1.3 {Log} (ref <1.30)

## 2013-02-21 LAB — HEPATITIS C ANTIBODY (REFLEX): HCV Ab: REACTIVE — AB

## 2013-02-21 MED ORDER — ALTEPLASE 2 MG IJ SOLR
2.0000 mg | Freq: Once | INTRAMUSCULAR | Status: AC
  Administered 2013-02-21: 2 mg
  Filled 2013-02-21: qty 2

## 2013-02-21 NOTE — Progress Notes (Signed)
Patient ID: GEN CLAGG, male   DOB: 02/14/1956, 57 y.o.   MRN: [id number] I have again talked to the patient in length.  The only procedure that will get rid of his questionable osteomyelitis is to remove the hardware, his hip and entire AKA stump and perform a girdlestone procedure.  This is a highly complicated surgery that I don not feel he will do well with and I really don't think ID truly understands this.  Just taking out is hardware and getting "sample tissue" from his femoral head for analysis will be difficult, but not take care of his situation if they feel that it is an infectious process.  If the femoral head is chronically infected, he will need a more serious and highly complicated procedure.  I spoke to him in length about this and he does not want to consent to the girdlestone procedure (removing the entire hip and thigh), but he still wants me to take the hardware out.  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  He says he understands this and wishes for me to proceed.  I will set him up for surgery tomorrow to remove the hardware and try to get tissue from the femoral had for pathology and ID, which can still be very difficult and cause significant risk to his health.

## 2013-02-21 NOTE — Progress Notes (Signed)
Patient ID: Chris [name], male   DOB: 07-01-1956, 57 y.o.   MRN: [id number] I spoke to Chris [name] again in great detail about his situation and had a similar discussion with Infectious Disease (Kees Synthia Innocent).  I am not proceeding to the OR tomorrow to take out his hardware.  I feel that the only operation that he should have if there is overwhelming evidence of osteomyelitis of his femoral head is to perform a full hip disarticulation and remove his femur thru a girdlestone procedure.  This is something he is not willing to consent to.  He still remains afebrile with stable vitals.  The antibiotics have been stopped.  Again, he never did appear septic, and his hip pain only started after a fall.  However, I believe the osteonecrosis of the femoral head has been going on for quite sometime.  I told him again, that taking out the hardware would not help his pain.  I have no further recommendations other than pain control and close follow-up to see how he does.  His does have chronic anemia and other medical problems that also makes me worried about proceeding with a hugh operation that is not urgent nor emergent.

## 2013-02-21 NOTE — Progress Notes (Signed)
[facility] PROGRESS NOTE  Chris [name]:[id number] DOB: Oct 21, 1955 DOA: 02/06/2013 PCP: Chris Stalling, MD   Brief HPI: [name] is a 57 y.o. male with a past medical history of chronic pain syndrome, coronary artery disease, osteomyelitis who is a bilateral amputee, and, who presented to [facility] on July 18th after a fall on his left hip. X ray suggested possible osteomyelitis. He was seen by Ortho. He underwent Ct and was started on Vanc. Ct showed OM. He underwent repeat Ct after a few days and it raised question of gas in the joint. Ortho felt that the hardware needed to come out. He was then transferred to [facility] for further work up. He has been seen by Ortho and ID.   Assessment/Plan: Chronic Osteomyelitis of left hip ID on board and is managing antibiotics. Plan is for surgery in Am for hardware removal and bone biopsy by Ortho.   Left hip pain/c h/o left hip fracture s/p left hip surgery and hardware several years ago.   As above. Ortho following. Continue current pain medications.   Seizure d/o Stable. Continue home regimen. No seizures activity reported.  Stage IV decubitus sacral ulcer Continue wound care and preventive measures  Nausea and vomiting Resolved. Was most likely due to withdrawal from narcotics.   Hyponatremia and hypokalemia Resolved.   Chronic pain syndrome Continue with his long-acting and short-acting narcotics.   History of depression and anxiety Continue with Xanax and Cymbalta.   Code Status: DNR Family Communication: No family at bedside. Disposition Plan:  Surgery in AM. Will likely need SNF at discharge.  Consultants:  Orthopaedic surgeon  Infectious Disease  Procedures:  Has a PICC RUE  Antibiotics:  Vancomycin 7/17--7/31 (completed 14 days)  HPI/Subjective: Continues to have pain in left hip. No new complaints.   Objective: Filed Vitals:   02/20/13 0503 02/20/13 1337 02/20/13 2054 02/21/13 0519  BP: 130/77  118/63 122/64 99/54  Pulse: 84 77 70   Temp: 98.7 F (37.1 C) 98 F (36.7 C) 98.7 F (37.1 C) 99.1 F (37.3 C)  TempSrc: Oral  Oral Oral  Resp: 16 20 18 18   Height:      Weight:      SpO2: 96% 98% 97% 95%    Intake/Output Summary (Last 24 hours) at 02/21/13 1351 Last data filed at 02/21/13 0616  Gross per 24 hour  Intake   2780 ml  Output   1800 ml  Net    980 ml   Filed Weights   02/10/13 0529 02/10/13 2134 02/12/13 0506  Weight: 89.3 kg (196 lb 13.9 oz) 91 kg (200 lb 9.9 oz) 90.4 kg (199 lb 4.7 oz)    Exam:   General:  Pt in NAD, Alert and awake;  Cardiovascular: S1S2 normal and regular. No rubs, murmurs or bruits.  Respiratory: CTA BL, no wheezes  Abdomen: soft, NT, ND  Musculoskeletal: Pt has BL AKA  Data Reviewed: Basic Metabolic Panel:  Recent Labs Lab 02/17/13 0500 02/19/13 0500 02/21/13 0625  NA 135 134* 135  K 4.4 4.1 3.6  CL 96 97 98  CO2 34* 28 27  GLUCOSE 101* 105* 116*  BUN 12 10 9   CREATININE 0.47* 0.46* 0.48*  CALCIUM 8.7 8.3* 8.2*   CBC: No results found for this basename: WBC, NEUTROABS, HGB, HCT, MCV, PLT,  in the last 168 hours  Recent Results (from the past 240 hour(s))  BODY FLUID CULTURE     Status: None   Collection Time  02/16/13 12:52 PM      Result Value Range Status   Specimen Description FLUID SYNOVIAL LEFT HIP   Final   Special Requests FROM THE JOINT   Final   Gram Stain     Final   Value: NO WBC SEEN     NO ORGANISMS SEEN   Culture NO GROWTH 3 DAYS   Final   Report Status 02/20/2013 FINAL   Final     Studies: No results found.  Scheduled Meds: . ALPRAZolam  1 mg Oral TID  . alteplase  2 mg Intracatheter Once  . carisoprodol  350 mg Oral TID  . docusate sodium  100 mg Oral BID  . DULoxetine  30 mg Oral QPM  . enoxaparin (LOVENOX) injection  40 mg Subcutaneous Q24H  . feeding supplement  237 mL Oral QID  . ferrous sulfate  325 mg Oral BID WC  . OxyCODONE  100 mg Oral Q12H  . PHENobarbital  32.4 mg  Oral Daily  . phenobarbital  64.8 mg Oral QHS  . polyethylene glycol  17 g Oral Daily  . sodium chloride  10-40 mL Intracatheter Q12H  . sodium chloride  3 mL Intravenous Q12H  . sodium chloride  3 mL Intravenous Q12H   Continuous Infusions: . sodium chloride 100 mL/hr (02/21/13 0835)    Principal Problem:   Chronic osteomyelitis, other specified site Active Problems:   Seizure disorder   Hyponatremia   Chronic normocytic anemia   Depression with anxiety   Hypokalemia   S/P AKA (above knee amputation) bilateral   Colostomy status   Chronic indwelling Foley catheter   Sepsis with complicated MRSA bacteremia and candidemia   Status post fall with left 10th rib fracture and left scapular fracture   PTSD (post-traumatic stress disorder)   Decubitus ulcer of sacral region, stage 4   Chronic pain syndrome   Left hip pain   Nausea and vomiting    Chris [name]  [facility] Pager [phone] If 7PM-7AM, please contact night-coverage at [email or web address], password [facility] 02/21/2013, 1:51 PM  LOS: 15 days
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
## 2013-02-22 DIAGNOSIS — D649 Anemia, unspecified: Secondary | ICD-10-CM

## 2013-02-22 LAB — CBC
HCT: 26.1 % — ABNORMAL LOW (ref 39.0–52.0)
Hemoglobin: 8.4 g/dL — ABNORMAL LOW (ref 13.0–17.0)
WBC: 2.3 10*3/uL — ABNORMAL LOW (ref 4.0–10.5)

## 2013-02-22 MED ORDER — ALUM & MAG HYDROXIDE-SIMETH 200-200-20 MG/5ML PO SUSP
30.0000 mL | ORAL | Status: DC | PRN
  Administered 2013-02-22 – 2013-02-25 (×6): 30 mL via ORAL
  Filled 2013-02-22 (×6): qty 30

## 2013-02-22 NOTE — Progress Notes (Signed)
[facility] PROGRESS NOTE  ORAN DILLENBURG MVH:[id number] DOB: 1956/05/24 DOA: 02/06/2013 PCP: [name], MD   Brief HPI: Chris [name] is a 57 y.o. male with a past medical history of chronic pain syndrome, coronary artery disease, osteomyelitis who is a bilateral amputee, and, who presented to [facility] on July 18th after a fall on his left hip. X ray suggested possible osteomyelitis. He was seen by Ortho. He underwent Ct and was started on Vanc. Ct showed OM. He underwent repeat Ct after a few days and it raised question of gas in the joint. Ortho felt that the hardware needed to come out. He was then transferred to [facility] for further work up. He has been seen by Ortho and ID.   Assessment/Plan:  Chronic Osteomyelitis of left hip After further discussions between Ortho and ID it was determined that limited surgery may not be optimal and extensive surgery carries significant risk. Plan is to hold off for now. He will remain off antibiotics and will be obeserved. Have asked Dr. Luciana Axe to provide further input regarding this observation off antibiotics.   Left hip pain/c h/o left hip fracture s/p left hip surgery and hardware several years ago.   As above. Continue current pain medications.   Normocytic Anemia Some drop in Hgb noted yesterday compared to 7/25. No obvious overt bleeding. Will repeat today. Transfuse as needed.  Seizure d/o Stable. Continue home regimen. No seizures activity reported.  Stage IV decubitus sacral ulcer Continue wound care and preventive measures  Nausea and vomiting Resolved. Was most likely due to withdrawal from narcotics.   Hyponatremia and hypokalemia Resolved.   Chronic pain syndrome Continue with his long-acting and short-acting narcotics.   History of depression and anxiety Continue with Xanax and Cymbalta.   Code Status: DNR Family Communication: No family at bedside. Disposition Plan:  Per ID at this point. Can be discharged to SNF  soon if the hip can be observed as outpatient.  Consultants:  Orthopaedic surgeon  Infectious Disease  Procedures:  Has a PICC RUE  Antibiotics:  Vancomycin 7/17--7/31 (completed 14 days)  HPI/Subjective: Continues to have pain in left hip. No new complaints.   Objective: Filed Vitals:   02/21/13 0519 02/21/13 1400 02/21/13 2136 02/22/13 0540  BP: 99/54 120/52 116/75 122/75  Pulse:  79 80 76  Temp: 99.1 F (37.3 C) 98.1 F (36.7 C) 98.2 F (36.8 C) 98.7 F (37.1 C)  TempSrc: Oral Oral Oral Oral  Resp: 18 20 18 18   Height:      Weight:      SpO2: 95% 98% 97% 96%    Intake/Output Summary (Last 24 hours) at 02/22/13 0902 Last data filed at 02/22/13 8413  Gross per 24 hour  Intake    840 ml  Output   3300 ml  Net  -2460 ml   Filed Weights   02/10/13 0529 02/10/13 2134 02/12/13 0506  Weight: 89.3 kg (196 lb 13.9 oz) 91 kg (200 lb 9.9 oz) 90.4 kg (199 lb 4.7 oz)    Exam:   General:  Pt in NAD, Alert and awake;  Cardiovascular: S1S2 normal and regular. No rubs, murmurs or bruits.  Respiratory: CTA BL, no wheezes  Abdomen: soft, NT, ND  Musculoskeletal: Pt has BL AKA  Data Reviewed: Basic Metabolic Panel:  Recent Labs Lab 02/17/13 0500 02/19/13 0500 02/21/13 0625  NA 135 134* 135  K 4.4 4.1 3.6  CL 96 97 98  CO2 34* 28 27  GLUCOSE 101*  105* 116*  BUN 12 10 9   CREATININE 0.47* 0.46* 0.48*  CALCIUM 8.7 8.3* 8.2*   CBC:  Recent Labs Lab 02/21/13 1500  WBC 2.6*  HGB 7.8*  HCT 24.5*  MCV 83.9  PLT 183    Recent Results (from the past 240 hour(s))  BODY FLUID CULTURE     Status: None   Collection Time    02/16/13 12:52 PM      Result Value Range Status   Specimen Description FLUID SYNOVIAL LEFT HIP   Final   Special Requests FROM THE JOINT   Final   Gram Stain     Final   Value: NO WBC SEEN     NO ORGANISMS SEEN   Culture NO GROWTH 3 DAYS   Final   Report Status 02/20/2013 FINAL   Final     Studies: No results  found.  Scheduled Meds: . ALPRAZolam  1 mg Oral TID  . carisoprodol  350 mg Oral TID  . docusate sodium  100 mg Oral BID  . DULoxetine  30 mg Oral QPM  . enoxaparin (LOVENOX) injection  40 mg Subcutaneous Q24H  . feeding supplement  237 mL Oral QID  . ferrous sulfate  325 mg Oral BID WC  . OxyCODONE  100 mg Oral Q12H  . PHENobarbital  32.4 mg Oral Daily  . phenobarbital  64.8 mg Oral QHS  . polyethylene glycol  17 g Oral Daily  . sodium chloride  10-40 mL Intracatheter Q12H  . sodium chloride  3 mL Intravenous Q12H  . sodium chloride  3 mL Intravenous Q12H   Continuous Infusions: . sodium chloride 100 mL/hr (02/21/13 0835)    Principal Problem:   Chronic osteomyelitis, other specified site Active Problems:   Seizure disorder   Hyponatremia   Chronic normocytic anemia   Depression with anxiety   Hypokalemia   S/P AKA (above knee amputation) bilateral   Colostomy status   Chronic indwelling Foley catheter   Sepsis with complicated MRSA bacteremia and candidemia   Status post fall with left 10th rib fracture and left scapular fracture   PTSD (post-traumatic stress disorder)   Decubitus ulcer of sacral region, stage 4   Chronic pain syndrome   Left hip pain   Nausea and vomiting    Chris [name]  [facility] Pager [phone] If 7PM-7AM, please contact night-coverage at [email or web address], password [facility] 02/22/2013, 9:02 AM  LOS: 16 days

## 2013-02-22 NOTE — Progress Notes (Signed)
[facility]  Patient Status:  Active pt with [facility] prior to this readmissions  [facility] is providing the following services:  Mr. Hoogendoorn was receiving [facility] RN and will need this upon DC to home along with [facility] for home IV ABX.  Pt may DC to SNF.  [facility] hospital coordinator team will follow pt to support pt and family home if that is their choice.  Will await final DC disposition.  If patient discharges after hours, please call [phone].   Sedalia Muta 02/22/2013, 7:00 AM

## 2013-02-22 NOTE — Progress Notes (Signed)
Case reviewed.  Likely has chronic osteomyelitis and would require surgical management for definitive treatment.  Not consenting to this at this time and no acute infection.  Off antibiotics and afebrile.  The plan will be to keep him off of antibiotics and observe with close follow up.  No indication from an ID standpoint for him to stay in the hospital, he just will need close follow up with orthopedics.

## 2013-02-23 NOTE — Progress Notes (Signed)
[facility] PROGRESS NOTE  STEPHANOS FAN UEA:[id number] DOB: 05/03/1956 DOA: 02/06/2013  PCP: [name], MD   Brief HPI: Chris [name] is a 57 y.o. male with a past medical history of chronic pain syndrome, coronary artery disease, osteomyelitis who is a bilateral amputee, and, who presented to [facility] on July 18th after a fall on his left hip. X ray suggested possible osteomyelitis. He was seen by Ortho. He underwent Ct and was started on Vanc. Ct showed OM. He underwent repeat Ct after a few days and it raised question of gas in the joint. Ortho felt that the hardware needed to come out. He was then transferred to [facility] for further work up. He has been seen by Ortho and ID.   Assessment/Plan:  Chronic Osteomyelitis of left hip After further discussions between Ortho and ID it was determined that limited surgery may not be optimal and extensive surgery carries significant risk. Plan is to hold off for now. He will remain off antibiotics and will be obeserved. Appreciate Dr. Ephriam Knuckles input. He was noted to have low grade fever yesterday. Will watch till tomorrow. If he doesn't have recurrence he should be able to go to SNF.   Left hip pain/c h/o left hip fracture s/p left hip surgery and hardware several years ago.   As above. Continue current pain medications.   Normocytic Anemia Hgb remains stable. Some drop noted compared to 7/25. No obvious overt bleeding. No need for transfusion currently.  Seizure d/o Stable. Continue home regimen. No seizures activity reported.  Stage IV decubitus sacral ulcer Continue wound care and preventive measures  Nausea and vomiting Resolved. Was most likely due to withdrawal from narcotics.   Hyponatremia and hypokalemia Resolved.   Chronic pain syndrome Continue with his long-acting and short-acting narcotics.   History of depression and anxiety Continue with Xanax and Cymbalta.   Code Status: DNR Family Communication: No family at  bedside. Disposition Plan:  If no fever, he can be discharged 8/4. Will d/w Dr. Daiva Eves in AM.   Consultants:  Orthopaedic surgeon  Infectious Disease  Procedures:  Has a PICC RUE  Antibiotics:  Vancomycin 7/17--7/31 (completed 14 days)  HPI/Subjective: Continues to have pain in left hip. He states the area feels 'harder'. No new complaints.   Objective: Filed Vitals:   02/22/13 1900 02/22/13 2100 02/23/13 0300 02/23/13 0608  BP:  115/56  96/48  Pulse:  97  73  Temp: 100.8 F (38.2 C) 100.4 F (38 C) 97.2 F (36.2 C) 98 F (36.7 C)  TempSrc:  Oral Oral   Resp:  18  18  Height:      Weight:      SpO2:  99%  98%    Intake/Output Summary (Last 24 hours) at 02/23/13 0921 Last data filed at 02/23/13 4782  Gross per 24 hour  Intake 3311.67 ml  Output   3300 ml  Net  11.67 ml   Filed Weights   02/10/13 0529 02/10/13 2134 02/12/13 0506  Weight: 89.3 kg (196 lb 13.9 oz) 91 kg (200 lb 9.9 oz) 90.4 kg (199 lb 4.7 oz)    Exam:   General:  Pt in NAD, Alert and awake;  Cardiovascular: S1S2 normal and regular. No rubs, murmurs or bruits.  Respiratory: CTA BL, no wheezes  Abdomen: soft, NT, ND  Musculoskeletal: Pt has BL AKA. Some firmness noted Left lateral thigh. But seems not too different from yesterday.  Data Reviewed: Basic Metabolic Panel:  Recent Labs  Lab 02/17/13 0500 02/19/13 0500 02/21/13 0625  NA 135 134* 135  K 4.4 4.1 3.6  CL 96 97 98  CO2 34* 28 27  GLUCOSE 101* 105* 116*  BUN 12 10 9   CREATININE 0.47* 0.46* 0.48*  CALCIUM 8.7 8.3* 8.2*   CBC:  Recent Labs Lab 02/21/13 1500 02/22/13 1135  WBC 2.6* 2.3*  HGB 7.8* 8.4*  HCT 24.5* 26.1*  MCV 83.9 83.4  PLT 183 176    Recent Results (from the past 240 hour(s))  BODY FLUID CULTURE     Status: None   Collection Time    02/16/13 12:52 PM      Result Value Range Status   Specimen Description FLUID SYNOVIAL LEFT HIP   Final   Special Requests FROM THE JOINT   Final   Gram Stain      Final   Value: NO WBC SEEN     NO ORGANISMS SEEN   Culture NO GROWTH 3 DAYS   Final   Report Status 02/20/2013 FINAL   Final     Studies: No results found.  Scheduled Meds: . ALPRAZolam  1 mg Oral TID  . carisoprodol  350 mg Oral TID  . docusate sodium  100 mg Oral BID  . DULoxetine  30 mg Oral QPM  . enoxaparin (LOVENOX) injection  40 mg Subcutaneous Q24H  . feeding supplement  237 mL Oral QID  . ferrous sulfate  325 mg Oral BID WC  . OxyCODONE  100 mg Oral Q12H  . PHENobarbital  32.4 mg Oral Daily  . phenobarbital  64.8 mg Oral QHS  . polyethylene glycol  17 g Oral Daily  . sodium chloride  10-40 mL Intracatheter Q12H  . sodium chloride  3 mL Intravenous Q12H  . sodium chloride  3 mL Intravenous Q12H   Continuous Infusions: . sodium chloride 100 mL/hr at 02/23/13 0609    Principal Problem:   Chronic osteomyelitis, other specified site Active Problems:   Seizure disorder   Hyponatremia   Chronic normocytic anemia   Depression with anxiety   Hypokalemia   S/P AKA (above knee amputation) bilateral   Colostomy status   Chronic indwelling Foley catheter   Sepsis with complicated MRSA bacteremia and candidemia   Status post fall with left 10th rib fracture and left scapular fracture   PTSD (post-traumatic stress disorder)   Decubitus ulcer of sacral region, stage 4   Chronic pain syndrome   Left hip pain   Nausea and vomiting   Normocytic anemia    Jamisen Hawes  [facility] Pager [phone]   If 7PM-7AM, please contact night-coverage at [email or web address], password [facility]  02/23/2013, 9:21 AM  LOS: 17 days

## 2013-02-24 DIAGNOSIS — B1920 Unspecified viral hepatitis C without hepatic coma: Secondary | ICD-10-CM

## 2013-02-24 LAB — BASIC METABOLIC PANEL
BUN: 8 mg/dL (ref 6–23)
Chloride: 92 mEq/L — ABNORMAL LOW (ref 96–112)
GFR calc Af Amer: >90 mL/min (ref >90)
Glucose, Bld: 111 mg/dL — ABNORMAL HIGH (ref 70–99)
Potassium: 4.2 mEq/L (ref 3.5–5.1)
Sodium: 130 mEq/L — ABNORMAL LOW (ref 135–145)

## 2013-02-24 LAB — CBC
HCT: 26.3 % — ABNORMAL LOW (ref 39.0–52.0)
Hemoglobin: 8.3 g/dL — ABNORMAL LOW (ref 13.0–17.0)
MCHC: 31.6 g/dL (ref 30.0–36.0)
RBC: 3.16 MIL/uL — ABNORMAL LOW (ref 4.22–5.81)

## 2013-02-24 LAB — HIV-1 RNA QUANT-NO REFLEX-BLD
HIV 1 RNA Quant: <20 copies/mL (ref <20)
HIV-1 RNA Quant, Log: <1.3 {Log} (ref <1.30)

## 2013-02-24 MED ORDER — DSS 100 MG PO CAPS: 100.0000 mg | ORAL_CAPSULE | Freq: Two times a day (BID) | ORAL | Status: DC

## 2013-02-24 MED ORDER — PANTOPRAZOLE SODIUM 40 MG PO TBEC: 40.0000 mg | DELAYED_RELEASE_TABLET | Freq: Every day | ORAL | Status: AC

## 2013-02-24 MED ORDER — ALPRAZOLAM 1 MG PO TABS: ORAL_TABLET | ORAL | Status: DC

## 2013-02-24 MED ORDER — CARISOPRODOL 350 MG PO TABS: 350.0000 mg | ORAL_TABLET | Freq: Three times a day (TID) | ORAL | Status: AC

## 2013-02-24 MED ORDER — OXYCODONE HCL 10 MG PO TABS: 10.0000 mg | ORAL_TABLET | ORAL | Status: DC | PRN

## 2013-02-24 MED ORDER — OXYCODONE HCL ER 20 MG PO T12A: 100.0000 mg | EXTENDED_RELEASE_TABLET | Freq: Two times a day (BID) | ORAL | Status: DC

## 2013-02-24 MED ORDER — ALUM & MAG HYDROXIDE-SIMETH 200-200-20 MG/5ML PO SUSP: 30.0000 mL | ORAL | Status: DC | PRN

## 2013-02-24 MED ORDER — ALBUTEROL SULFATE (5 MG/ML) 0.5% IN NEBU: 2.5000 mg | INHALATION_SOLUTION | RESPIRATORY_TRACT | Status: DC | PRN

## 2013-02-24 MED ORDER — ACETAMINOPHEN 325 MG PO TABS: 650.0000 mg | ORAL_TABLET | Freq: Four times a day (QID) | ORAL | Status: DC | PRN

## 2013-02-24 NOTE — Progress Notes (Signed)
[facility] PROGRESS NOTE  Chris [name] NWG:[id number] DOB: [birth year] DOA: 02/06/2013  PCP: [name], MD   Brief HPI: Chris [name] is a 57 y.o. male with a past medical history of chronic pain syndrome, coronary artery disease, osteomyelitis who is a bilateral amputee, and, who presented to [facility] on July 18th after a fall on his left hip. X ray suggested possible osteomyelitis. He was seen by Ortho. He underwent Ct and was started on Vanc. Ct showed OM. He underwent repeat Ct after a few days and it raised question of gas in the joint. Ortho felt that the hardware needed to come out. He was then transferred to [facility] for further work up. He has been seen by Ortho and ID.   Assessment/Plan:  Chronic Osteomyelitis of left hip After further discussions between Ortho and ID it was determined that limited surgery may not be optimal and extensive surgery carries significant risk. Plan is to hold off for now. He has been off of antibiotics since 7/31 and is being obeserved. He was noted to have low grade fever on 8/3. Temp upto 99.5 yesterday and overnight. Have discussed with Dr. Daiva Eves and he will evaluate patient and then make decisions.   Left hip pain/c h/o left hip fracture s/p left hip surgery and hardware several years ago.   As above. Continue current pain medications.   Normocytic Anemia Hgb remains stable. Some drop noted compared to 7/25. No obvious overt bleeding. No need for transfusion currently.  Seizure d/o Stable. Continue home regimen. No seizures activity reported.  Stage IV decubitus sacral ulcer Continue wound care and preventive measures  Nausea and vomiting Resolved. Was most likely due to withdrawal from narcotics.   Hyponatremia and hypokalemia Resolved.   Chronic pain syndrome Continue with his long-acting and short-acting narcotics.   History of depression and anxiety Continue with Xanax and Cymbalta.   Code Status: DNR Family Communication:  No family at bedside. Disposition Plan: Dr. Daiva Eves to see today and decide how to proceed.    Consultants:  Orthopaedic surgeon  Infectious Disease  Procedures:  Has a PICC RUE  Antibiotics:  Vancomycin 7/17--7/31 (completed 14 days)  HPI/Subjective: No new complaints. He continues to have pain in left hip.    Objective: Filed Vitals:   02/23/13 1100 02/23/13 1455 02/23/13 2030 02/24/13 0538  BP:  126/57 130/67 108/57  Pulse:  73 86 71  Temp: 97.4 F (36.3 C) 98.1 F (36.7 C) 99.5 F (37.5 C) 99.1 F (37.3 C)  TempSrc:    Oral  Resp:  18 18 14   Height:      Weight:      SpO2:  99% 97% 98%    Intake/Output Summary (Last 24 hours) at 02/24/13 0810 Last data filed at 02/24/13 0700  Gross per 24 hour  Intake   5025 ml  Output   3700 ml  Net   1325 ml   Filed Weights   02/10/13 0529 02/10/13 2134 02/12/13 0506  Weight: 89.3 kg (196 lb 13.9 oz) 91 kg (200 lb 9.9 oz) 90.4 kg (199 lb 4.7 oz)    Exam:   General:  Pt in NAD, Alert and awake;  Cardiovascular: S1S2 normal and regular. No rubs, murmurs or bruits.  Respiratory: CTA BL, no wheezes  Abdomen: soft, NT, ND  Musculoskeletal: Pt has BL AKA. Firmness noted Left lateral thigh.   Data Reviewed: Basic Metabolic Panel:  Recent Labs Lab 02/19/13 0500 02/21/13 0625 02/24/13 0500  NA 134* 135 130*  K 4.1 3.6 4.2  CL 97 98 92*  CO2 28 27 30   GLUCOSE 105* 116* 111*  BUN 10 9 8   CREATININE 0.46* 0.48* 0.45*  CALCIUM 8.3* 8.2* 8.7   CBC:  Recent Labs Lab 02/21/13 1500 02/22/13 1135 02/24/13 0500  WBC 2.6* 2.3* 2.8*  HGB 7.8* 8.4* 8.3*  HCT 24.5* 26.1* 26.3*  MCV 83.9 83.4 83.2  PLT 183 176 212    Recent Results (from the past 240 hour(s))  BODY FLUID CULTURE     Status: None   Collection Time    02/16/13 12:52 PM      Result Value Range Status   Specimen Description FLUID SYNOVIAL LEFT HIP   Final   Special Requests FROM THE JOINT   Final   Gram Stain     Final   Value: NO WBC  SEEN     NO ORGANISMS SEEN   Culture NO GROWTH 3 DAYS   Final   Report Status 02/20/2013 FINAL   Final     Studies: No results found.  Scheduled Meds: . ALPRAZolam  1 mg Oral TID  . carisoprodol  350 mg Oral TID  . docusate sodium  100 mg Oral BID  . DULoxetine  30 mg Oral QPM  . enoxaparin (LOVENOX) injection  40 mg Subcutaneous Q24H  . feeding supplement  237 mL Oral QID  . ferrous sulfate  325 mg Oral BID WC  . OxyCODONE  100 mg Oral Q12H  . PHENobarbital  32.4 mg Oral Daily  . phenobarbital  64.8 mg Oral QHS  . polyethylene glycol  17 g Oral Daily  . sodium chloride  10-40 mL Intracatheter Q12H  . sodium chloride  3 mL Intravenous Q12H  . sodium chloride  3 mL Intravenous Q12H   Continuous Infusions: . sodium chloride 100 mL/hr at 02/24/13 0600    Principal Problem:   Chronic osteomyelitis, other specified site Active Problems:   Seizure disorder   Hyponatremia   Chronic normocytic anemia   Depression with anxiety   Hypokalemia   S/P AKA (above knee amputation) bilateral   Colostomy status   Chronic indwelling Foley catheter   Sepsis with complicated MRSA bacteremia and candidemia   Status post fall with left 10th rib fracture and left scapular fracture   PTSD (post-traumatic stress disorder)   Decubitus ulcer of sacral region, stage 4   Chronic pain syndrome   Left hip pain   Nausea and vomiting   Normocytic anemia    Rusti Arizmendi  [facility] Pager [phone]   If 7PM-7AM, please contact night-coverage at [email or web address], password [facility]  02/24/2013, 8:10 AM  LOS: 18 days
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
## 2013-02-25 DIAGNOSIS — F329 Major depressive disorder, single episode, unspecified: Secondary | ICD-10-CM

## 2013-02-25 MED ORDER — HEPARIN SOD (PORK) LOCK FLUSH 100 UNIT/ML IV SOLN
250.0000 [IU] | INTRAVENOUS | Status: AC | PRN
  Administered 2013-02-25: 500 [IU]
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
## 2013-02-25 NOTE — Discharge Summary (Signed)
[facility]  Physician Discharge Summary   Patient ID: Chris [name] MRN: [id number] DOB/AGE: August 19, 1955 57 y.o.  Admit date: 02/06/2013 Discharge date: 02/25/2013  PCP: [name], MD  DISCHARGE DIAGNOSES:  Principal Problem:   Chronic osteomyelitis, other specified site Active Problems:   Seizure disorder   Hyponatremia   Chronic normocytic anemia   Depression with anxiety   Hypokalemia   S/P AKA (above knee amputation) bilateral   Colostomy status   Chronic indwelling Foley catheter   Sepsis with complicated MRSA bacteremia and candidemia   Status post fall with left 10th rib fracture and left scapular fracture   PTSD (post-traumatic stress disorder)   Decubitus ulcer of sacral region, stage 4   Chronic pain syndrome   Left hip pain   Nausea and vomiting   Normocytic anemia   RECOMMENDATIONS FOR OUTPATIENT FOLLOW UP: 1. Monitor for fever and call Dr. Daiva Eves if noted. 2. Remove PICC after consulting Dr. Daiva Eves after 5 days. 3. CBC, Bmet in 1 week.   DISCHARGE CONDITION: fair  Diet recommendation: Parke Simmers Diet  Filed Weights   02/10/13 0529 02/10/13 2134 02/12/13 0506  Weight: 89.3 kg (196 lb 13.9 oz) 91 kg (200 lb 9.9 oz) 90.4 kg (199 lb 4.7 oz)    INITIAL HISTORY: Chris [name] is a 57 y.o. male with a past medical history of chronic pain syndrome, coronary artery disease, osteomyelitis who is a bilateral amputee, and, who presented to [facility] on July 18th after a fall on his left hip. X ray suggested possible osteomyelitis. He was seen by Ortho. He underwent Ct and was started on Vanc. Ct showed OM. He underwent repeat Ct after a few days and it raised question of gas in the joint. Ortho felt that the hardware needed to come out. He was then transferred to [facility] for further work up. He has been seen by Ortho and ID.   Consultants:  Orthopedic surgeon (Dr. Magnus Ivan) Infectious Disease (Dr. Daiva Eves)  Procedures:  PICC line placed in LUE  HOSPITAL  COURSE:   Chronic Osteomyelitis of left hip  After further discussions between Ortho and ID it was determined that limited surgery may not be optimal and extensive surgery carries significant risk. Plan is to hold off for now. He has been off of antibiotics since 7/31 and is being obeserved. He was noted to have low grade fever over the weekend. Patient seen by Dr. Daiva Eves and it was felt that he could be monitored further at [facility]. Patient reports fever of 101F on 8/4 night. None reported on EMR. No reports of anitpyretics administration. Afebrile this morning. He might have misheard. Will leave PICC in for now. If he doesn't get fever for another 4-5 days, this can be removed at the SNF.   Left hip pain/c h/o left hip fracture s/p left hip surgery and hardware several years ago.  As above. Continue current pain medications.   Normocytic Anemia  Hgb remains stable. Some drop noted compared to 7/25. No obvious overt bleeding. No need for transfusion currently.   Seizure d/o  Stable. Continue home regimen. No seizures activity reported.   Stage IV decubitus sacral ulcer  Continue wound care and preventive measures   Nausea and vomiting  Resolved. Was most likely due to withdrawal from narcotics.   Chronic pain syndrome  Continue with his long-acting and short-acting narcotics.   History of depression and anxiety  Continue with Xanax and Cymbalta.   Code Status: DNR  PERTINENT LABS:  The results of significant diagnostics from this hospitalization (including imaging, microbiology, ancillary and laboratory) are listed below for reference.    Microbiology: Recent Results (from the past 240 hour(s))  BODY FLUID CULTURE     Status: None   Collection Time    02/16/13 12:52 PM      Result Value Range Status   Specimen Description FLUID SYNOVIAL LEFT HIP   Final   Special Requests FROM THE JOINT   Final   Gram Stain     Final   Value: NO WBC SEEN     NO ORGANISMS SEEN   Culture NO  GROWTH 3 DAYS   Final   Report Status 02/20/2013 FINAL   Final     Labs: Basic Metabolic Panel:  Recent Labs Lab 02/19/13 0500 02/21/13 0625 02/24/13 0500  NA 134* 135 130*  K 4.1 3.6 4.2  CL 97 98 92*  CO2 28 27 30   GLUCOSE 105* 116* 111*  BUN 10 9 8   CREATININE 0.46* 0.48* 0.45*  CALCIUM 8.3* 8.2* 8.7   CBC:  Recent Labs Lab 02/21/13 1500 02/22/13 1135 02/24/13 0500  WBC 2.6* 2.3* 2.8*  HGB 7.8* 8.4* 8.3*  HCT 24.5* 26.1* 26.3*  MCV 83.9 83.4 83.2  PLT 183 176 212    IMAGING STUDIES Dg Hip Complete Left  02/06/2013   *RADIOLOGY REPORT*  Clinical Data: Trauma with left hip pain.  Fall from wheelchair.  LEFT HIP - COMPLETE 2+ VIEW  Comparison: Radiography 11/11/2012.  Findings: Status post compressive left hip screw for treatment of previous femur fracture.  There has been progressive loss of the normal superior acetabular cortex, blurring with the femoral head. There is new fragmentation of the superior and posterior acetabular rim.  Similar appearance of remote trauma to the pubic rami.  Osteolysis, on a chronic basis, of the inferior pubic rami and ischial tuberosities, related to osteomyelitis.  Aggressive osteopenia in the distal left femoral diaphysis, related to amputation presumably.  IMPRESSION:  Progressive left hip joint narrowing and fragmentation since November 10, 2012.  This may be post traumatic or infectious. Cross sectional imaging may help differentiate - if clinically needed.   Original Report Authenticated By: Tiburcio Pea   Ct Hip Left W Contrast  02/21/2013   **ADDENDUM** CREATED: 02/21/2013 13:46:21  Original report by Dr. Andria Meuse.  Addendum by Dr. Rito Ehrlich.  Dictation error in the original report.  The imaging findings all refer to the LEFT hip.  The right hip was not imaged.  **END ADDENDUM** SIGNED BY: Charline Bills, M.D.  02/11/2013   *RADIOLOGY REPORT*  Clinical Data: Follow-up from 02/07/2013.  Previous abnormality suggesting osteomyelitis.  CT  OF THE LEFT HIP WITH CONTRAST  Technique:  Multidetector CT imaging was performed following the standard protocol during bolus administration of intravenous contrast.  Contrast: OMNIPAQUE IOHEXOL 300 MG/ML  SOLN  Comparison: 02/07/2013  Findings: Postoperative changes with the right side above the knee amputation and internal fixation of the right hip.  Again demonstrated is diffuse soft tissue edema around the right hip with right hip effusion.  Intra-articular gas collections demonstrated previously are still visualized but are less prominent.  There is a bone destruction of the femoral head and the superior acetabulum. Changes remains worrisome for septic arthritis.  The distal aspect of the compression bolt protrudes through the surface of the femoral head due to overlying bone erosion.  Suggestion of small focal cortical break along the medial acetabular roof.  Old fracture  deformities of the superior and inferior pubic rami.  Skin defect along the posterior peroneal region with gas collection extending almost to the ischium suggesting fistula. Additional central skin defect consistent with ulceration with gas collection extending to the posterior coccyx.  No associated bone destruction. No displaced fractures identified.  Bone remodelling at the amputation stump.  No enhancement or loculation to suggest soft tissue abscess.  Foley catheter decompresses the bladder.  IMPRESSION: Right hip joint effusion with interarticular gas collection and bone destruction involving the femoral head and superior acetabulum.  Changes worrisome for septic arthritis.  Due to bone erosion, the compression screw tip protrudes through the bone surface into the interarticular space.  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 Apparent decubitus ulcer with gas extending to the posterior surface of the coccyx at the midline.   Original Report Authenticated By: Burman Nieves, M.D.    Ct Hip Left W Contrast  02/07/2013   *RADIOLOGY REPORT*  Clinical Data: Status post fall 02/06/2013.  Low grade fever.  Left hip pain.  CT OF THE LEFT HIP WITH CONTRAST  Technique:  Multidetector CT imaging was performed following the standard protocol during bolus administration of intravenous contrast.  Contrast: OMNIPAQUE IOHEXOL 300 MG/ML  SOLN  Comparison: CT abdomen and pelvis 09/14/2012.  Plain films left hip 11/11/2012 and 02/06/2013.  Findings: Gamma nail is in place for fixation of a left intertrochanteric fracture.  There is extensive soft tissue edema about the left hip.  Two small locules of gas are present in the joint.  There is bony destructive change in the femoral head and about the acetabulum with loss of the normal cortical margins identified.  Findings are highly suspicious for septic hip joint and osteomyelitis.  The anterior aspect of the nail the femoral head is uncovered due to bony destruction.  There is a small amount of fluid subjacent to the left iliacus but no discrete abscess is identified.  Linear infiltration of fat along the medial aspect of the right thigh is also identified likely due to scarring.  There is fatty atrophy of right thigh musculature, particularly the semimembranosus and semitendinosus. The visualized femur has a mottled appearance.  Postoperative change of above-the-knee amputation is noted.  IMPRESSION:  1.  Findings highly concerning for septic left hip joint with associated osteomyelitis in the left acetabulum and femoral head. No discrete soft tissue abscess is identified. 2.  Mottled appearance of the cortex of the femur may be due to disuse osteopenia rather than osteomyelitis.  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  Critical Value/emergent results were called by telephone at the time of interpretation on 02/07/2013 at 2:40 p.m. to Zannie Kehr, the patient's nurse, who verbally acknowledged these results.   Original Report Authenticated By: Holley Dexter, M.D.   Dg Chest Port 1 View  02/07/2013   *RADIOLOGY REPORT*  Clinical Data: Evaluate PICC line placement  PORTABLE CHEST - 1 VIEW  Comparison: 11/11/2012  Findings:  Grossly unchanged cardiac silhouette and mediastinal contours given patient rotation.  Interval placement of right upper extremity approach PICC line with tip projecting over the superior cavoatrial junction.  There is persistent mild elevation of the right hemidiaphragm.  The pulmonary vasculature is less distinct on the present examination with cephalization of flow.  There is minimal thickening along the right minor fissure.  No definite pleural effusion, though note, the bilateral costophrenic angles are excluded from view.  Multiple surgical clips overlie the lateral aspect  of the left chest wall.  No definite acute osseous abnormality.  IMPRESSION: 1.  Right upper extremity approach PICC line tip projects over the superior cavoatrial junction. 2.  Suspected mild pulmonary edema.   Original Report Authenticated By: Tacey Ruiz, MD   Dg Abd Portable 2v  02/07/2013   *RADIOLOGY REPORT*  Clinical Data: Nausea, vomiting and abdominal pain.  PORTABLE ABDOMEN - 2 VIEW  Comparison: CT 09/14/2012  Findings: No evidence for free air on the upright view.  The patient has an IVC filter.  Nonspecific bowel gas pattern with gas in the colon and stomach.  Left femoral nail is partially visualized.  IMPRESSION: Nonspecific bowel gas pattern.   Original Report Authenticated By: Richarda Overlie, M.D.   Dg Fluoro Guide Ndl Plc/bx  02/16/2013   *RADIOLOGY REPORT*  Clinical Data: Left hip infection  LEFT HIP ASPIRATION UNDER FLUOROSCOPY  Technique:   Overlying skin prepped with Betadine, draped in the usual sterile fashion, and infiltrated locally with buffered Lidocaine. 18 gauge spinal needle advanced to the superolateral margin of the left femoral head. Half the ccs serosanguinous fluid was aspirated.  Fluoroscopy Time: 60 seconds.  IMPRESSION:  Technically successful left hip aspiration under fluoroscopy.   Original Report Authenticated By: Jolaine Click, M.D.    DISCHARGE EXAMINATION: See progress note from earlier today  DISPOSITION: SNF  Discharge Orders   Future Appointments Provider Department Dept Phone   03/13/2013 3:30 PM Cliffton Asters, MD [facility] for Infectious Disease [phone]   Future Orders Complete By Expires     Diet - low sodium heart healthy  As directed     Discharge instructions  As directed     Comments:      Please call Dr. Daiva Eves with Infectious Diseases if patient has a fever high her 101F. Leave PICC line in for 5 days and then call Dr. Daiva Eves to see if it can come out.    Increase activity slowly  As directed        ALLERGIES:  Allergies  Allergen Reactions  . Fish Allergy Anaphylaxis    Pt can tolerate shellfish  . Neurontin (Gabapentin) Other (See Comments)    "makes me violent and crazy" per patient  . Ibuprofen Other (See Comments)    hallucinations  . Ketorolac Tromethamine Other (See Comments)    hallucination  . Naproxen Other (See Comments)    hallucination    Current Discharge Medication List    START taking these medications   Details  acetaminophen (TYLENOL) 325 MG tablet Take 2 tablets (650 mg total) by mouth every 6 (six) hours as needed for pain or fever.    albuterol (PROVENTIL) (5 MG/ML) 0.5% nebulizer solution Take 0.5 mLs (2.5 mg total) by nebulization every 2 (two) hours as needed for wheezing. Qty: 20 mL, Refills: 12    alum & mag hydroxide-simeth (MAALOX/MYLANTA) 200-200-20 MG/5ML suspension Take 30 mLs by mouth every 4 (four) hours as needed. Qty: 355 mL, Refills: 0    docusate sodium 100 MG CAPS Take 100 mg by mouth 2 (two) times daily. Qty: 10 capsule, Refills: 0    OxyCODONE (OXYCONTIN) 20 mg T12A 12 hr tablet Take 5 tablets (100 mg total) by mouth every 12 (twelve) hours. Qty: 60 tablet, Refills: 0      CONTINUE these medications  which have CHANGED   Details  ALPRAZolam (XANAX) 1 MG tablet Take one tablet three times a day Qty: 90 tablet, Refills: 0  carisoprodol (SOMA) 350 MG tablet Take 1 tablet (350 mg total) by mouth 3 (three) times daily. Qty: 90 tablet, Refills: 0    oxyCODONE 10 MG TABS Take 1 tablet (10 mg total) by mouth every 4 (four) hours as needed for pain. Qty: 30 tablet, Refills: 0    pantoprazole (PROTONIX) 40 MG tablet Take 1 tablet (40 mg total) by mouth daily at 12 noon. Qty: 30 tablet, Refills: 0      CONTINUE these medications which have NOT CHANGED   Details  DULoxetine (CYMBALTA) 30 MG capsule Take 1 capsule (30 mg total) by mouth every evening. Qty: 30 capsule, Refills: 0    feeding supplement (ENSURE COMPLETE) LIQD Take 237 mLs by mouth 4 (four) times daily.    ferrous sulfate 325 (65 FE) MG tablet Take 1 tablet (325 mg total) by mouth 2 (two) times daily with a meal.    Multiple Vitamin (MULTIVITAMIN WITH MINERALS) TABS Take 1 tablet by mouth every morning.    PHENobarbital (LUMINAL) 32.4 MG tablet Take 32.4-64.8 mg by mouth 2 (two) times daily. Take 1 tablet in the morning and 2 tablets at in the evening    polyethylene glycol (MIRALAX / GLYCOLAX) packet Take 17 g by mouth daily.    albuterol (PROVENTIL HFA;VENTOLIN HFA) 108 (90 BASE) MCG/ACT inhaler Inhale 2 puffs into the lungs every 6 (six) hours as needed for wheezing. Qty: 1 Inhaler, Refills: 2      STOP taking these medications     lisinopril (PRINIVIL,ZESTRIL) 10 MG tablet      oxyCODONE (OXYCONTIN) 20 MG 12 hr tablet        Follow-up Information   Follow up with [name], MD. Schedule an appointment as soon as possible for a visit in 2 weeks.   Contact information:   [address] [place] [address] [phone]       Follow up with [name], MD. Schedule an appointment as soon as possible for a visit in 1 week. (Call if patient has fever. Call after 5 days to see if PICC line can  come out.)    Contact information:   [address] [place] [address] [phone]       TOTAL DISCHARGE TIME: 35 mins  [facility]  [facility] Pager [phone]  02/25/2013, 8:14 AM

## 2013-02-25 NOTE — Progress Notes (Signed)
Clinical social worker assisted with patient discharge to skilled nursing facility, [facility].  CSW addressed all family questions and concerns. CSW copied chart and added all important documents. CSW also set up patient transportation with [employer]. Clinical Social Worker will sign off for now as social work intervention is no longer needed.   Sabino Niemann, MSW, [phone]

## 2013-03-13 ENCOUNTER — Ambulatory Visit (INDEPENDENT_AMBULATORY_CARE_PROVIDER_SITE_OTHER): Payer: Medicaid Other | Admitting: Internal Medicine

## 2013-03-13 ENCOUNTER — Encounter: Payer: Self-pay | Admitting: Internal Medicine

## 2013-03-13 ENCOUNTER — Telehealth: Payer: Self-pay | Admitting: *Deleted

## 2013-03-13 VITALS — BP 127/75 | HR 81 | Temp 98.1°F

## 2013-03-13 DIAGNOSIS — M8660 Other chronic osteomyelitis, unspecified site: Secondary | ICD-10-CM

## 2013-03-13 NOTE — Telephone Encounter (Signed)
Patient has been scheduled for a CT with contrast of the left and CT aspiration.  PA has been submitted, awaiting fax with approval number for CT.   Aspiration does not require at prior authorization.  CT scheduled at [facility] for 10am 03/14/13. Pt is not to eat after 7am.  Pt will be at [facility] for 4-5 hours. Carollee Herter, RN at [facility] [place] notified.  She is informing her scheduler to set up transportation. RN attempted to call patient to notify him, was unable to leave a message.
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
## 2013-03-14 ENCOUNTER — Other Ambulatory Visit: Payer: Self-pay | Admitting: Radiology

## 2013-03-14 ENCOUNTER — Ambulatory Visit (HOSPITAL_COMMUNITY): Admission: RE | Admit: 2013-03-14 | Payer: Medicaid Other | Source: Ambulatory Visit

## 2013-03-14 ENCOUNTER — Telehealth: Payer: Self-pay | Admitting: Licensed Clinical Social Worker

## 2013-03-14 NOTE — Telephone Encounter (Signed)
Tammy,RN from [facility] called stating that the patient did not have the CT aspiration done today, she is going to call and reschedule for the patient.  If we have any questions, she asked [place] to call back and ask for lower B hall nurse.

## 2013-03-17 ENCOUNTER — Ambulatory Visit (HOSPITAL_COMMUNITY)
Admission: RE | Admit: 2013-03-17 | Discharge: 2013-03-17 | Disposition: A | Discharge status: Home / Self Care | Payer: Medicaid Other | Source: Ambulatory Visit | Attending: Internal Medicine | Admitting: Internal Medicine

## 2013-03-17 ENCOUNTER — Encounter (HOSPITAL_COMMUNITY): Payer: Self-pay

## 2013-03-17 DIAGNOSIS — L8990 Pressure ulcer of unspecified site, unspecified stage: Secondary | ICD-10-CM | POA: Insufficient documentation

## 2013-03-17 DIAGNOSIS — M8660 Other chronic osteomyelitis, unspecified site: Secondary | ICD-10-CM

## 2013-03-17 DIAGNOSIS — L89209 Pressure ulcer of unspecified hip, unspecified stage: Secondary | ICD-10-CM | POA: Insufficient documentation

## 2013-03-17 DIAGNOSIS — M86659 Other chronic osteomyelitis, unspecified thigh: Secondary | ICD-10-CM | POA: Insufficient documentation

## 2013-03-17 MED ORDER — FENTANYL CITRATE 0.05 MG/ML IJ SOLN
INTRAMUSCULAR | Status: AC
  Filled 2013-03-17: qty 4

## 2013-03-17 MED ORDER — IOHEXOL 300 MG/ML  SOLN
100.0000 mL | Freq: Once | INTRAMUSCULAR | Status: AC | PRN
  Administered 2013-03-17: 100 mL via INTRAVENOUS

## 2013-03-17 MED ORDER — LIDOCAINE HCL 1 % IJ SOLN
INTRAMUSCULAR | Status: AC
  Filled 2013-03-17: qty 10

## 2013-03-17 MED ORDER — FENTANYL CITRATE 0.05 MG/ML IJ SOLN
50.0000 ug | Freq: Once | INTRAMUSCULAR | Status: AC
  Administered 2013-03-17: 50 ug via INTRAVENOUS
  Filled 2013-03-17: qty 1

## 2013-03-17 NOTE — Procedures (Signed)
Successful LT HIP LATERAL SUBCUTANEOUS FLD COLLECTION ASPIRATION NO COMP 15CC BLOODY FLD ASPIRATED SUSPECT HEMATOMA GS AND CX SENT

## 2013-03-18 ENCOUNTER — Telehealth: Payer: Self-pay | Admitting: *Deleted

## 2013-03-18 ENCOUNTER — Ambulatory Visit: Payer: Medicaid Other | Admitting: Internal Medicine

## 2013-03-18 ENCOUNTER — Other Ambulatory Visit (HOSPITAL_COMMUNITY): Payer: Medicaid Other

## 2013-03-18 NOTE — Telephone Encounter (Signed)
Message left requesting pt call for new follow-up appointment w/ Dr. Orvan Falconer.

## 2013-03-19 ENCOUNTER — Other Ambulatory Visit (HOSPITAL_COMMUNITY): Payer: Medicaid Other

## 2013-03-19 ENCOUNTER — Telehealth: Payer: Self-pay | Admitting: *Deleted

## 2013-03-19 NOTE — Telephone Encounter (Signed)
F/U appt w/ Dr. Orvan Falconer, 04/07/13 @ 1115.

## 2013-03-20 LAB — CULTURE, ROUTINE-ABSCESS

## 2013-04-07 ENCOUNTER — Ambulatory Visit: Payer: Medicaid Other | Admitting: Internal Medicine

## 2013-05-26 ENCOUNTER — Emergency Department (HOSPITAL_COMMUNITY): Payer: Medicaid Other

## 2013-05-26 ENCOUNTER — Encounter (HOSPITAL_COMMUNITY): Payer: Self-pay | Admitting: Emergency Medicine

## 2013-05-26 ENCOUNTER — Inpatient Hospital Stay (HOSPITAL_COMMUNITY)
Admission: EM | Admit: 2013-05-26 | Discharge: 2013-05-30 | DRG: 100 | Disposition: A | Discharge status: Rehabilitation Facility | Payer: Medicaid Other | Attending: Internal Medicine | Admitting: Internal Medicine

## 2013-05-26 DIAGNOSIS — D649 Anemia, unspecified: Secondary | ICD-10-CM

## 2013-05-26 DIAGNOSIS — F341 Dysthymic disorder: Secondary | ICD-10-CM | POA: Diagnosis present

## 2013-05-26 DIAGNOSIS — Z993 Dependence on wheelchair: Secondary | ICD-10-CM

## 2013-05-26 DIAGNOSIS — G40909 Epilepsy, unspecified, not intractable, without status epilepticus: Principal | ICD-10-CM

## 2013-05-26 DIAGNOSIS — F418 Other specified anxiety disorders: Secondary | ICD-10-CM | POA: Diagnosis present

## 2013-05-26 DIAGNOSIS — L89322 Pressure ulcer of left buttock, stage 2: Secondary | ICD-10-CM

## 2013-05-26 DIAGNOSIS — L89109 Pressure ulcer of unspecified part of back, unspecified stage: Secondary | ICD-10-CM | POA: Diagnosis present

## 2013-05-26 DIAGNOSIS — I10 Essential (primary) hypertension: Secondary | ICD-10-CM | POA: Diagnosis present

## 2013-05-26 DIAGNOSIS — Z79899 Other long term (current) drug therapy: Secondary | ICD-10-CM

## 2013-05-26 DIAGNOSIS — R112 Nausea with vomiting, unspecified: Secondary | ICD-10-CM

## 2013-05-26 DIAGNOSIS — Z6841 Body Mass Index (BMI) 40.0 and over, adult: Secondary | ICD-10-CM

## 2013-05-26 DIAGNOSIS — L89154 Pressure ulcer of sacral region, stage 4: Secondary | ICD-10-CM | POA: Diagnosis present

## 2013-05-26 DIAGNOSIS — Z85038 Personal history of other malignant neoplasm of large intestine: Secondary | ICD-10-CM

## 2013-05-26 DIAGNOSIS — G894 Chronic pain syndrome: Secondary | ICD-10-CM

## 2013-05-26 DIAGNOSIS — M25559 Pain in unspecified hip: Secondary | ICD-10-CM | POA: Diagnosis present

## 2013-05-26 DIAGNOSIS — Z8614 Personal history of Methicillin resistant Staphylococcus aureus infection: Secondary | ICD-10-CM

## 2013-05-26 DIAGNOSIS — L89314 Pressure ulcer of right buttock, stage 4: Secondary | ICD-10-CM

## 2013-05-26 DIAGNOSIS — Z951 Presence of aortocoronary bypass graft: Secondary | ICD-10-CM

## 2013-05-26 DIAGNOSIS — E669 Obesity, unspecified: Secondary | ICD-10-CM | POA: Diagnosis present

## 2013-05-26 DIAGNOSIS — R82998 Other abnormal findings in urine: Secondary | ICD-10-CM | POA: Diagnosis present

## 2013-05-26 DIAGNOSIS — F17200 Nicotine dependence, unspecified, uncomplicated: Secondary | ICD-10-CM | POA: Diagnosis present

## 2013-05-26 DIAGNOSIS — L89309 Pressure ulcer of unspecified buttock, unspecified stage: Secondary | ICD-10-CM | POA: Diagnosis present

## 2013-05-26 DIAGNOSIS — F32A Depression, unspecified: Secondary | ICD-10-CM

## 2013-05-26 DIAGNOSIS — W19XXXA Unspecified fall, initial encounter: Secondary | ICD-10-CM | POA: Diagnosis present

## 2013-05-26 DIAGNOSIS — Z89611 Acquired absence of right leg above knee: Secondary | ICD-10-CM

## 2013-05-26 DIAGNOSIS — Z933 Colostomy status: Secondary | ICD-10-CM

## 2013-05-26 DIAGNOSIS — M25552 Pain in left hip: Secondary | ICD-10-CM | POA: Diagnosis present

## 2013-05-26 DIAGNOSIS — Z978 Presence of other specified devices: Secondary | ICD-10-CM

## 2013-05-26 DIAGNOSIS — I252 Old myocardial infarction: Secondary | ICD-10-CM

## 2013-05-26 DIAGNOSIS — F4310 Post-traumatic stress disorder, unspecified: Secondary | ICD-10-CM

## 2013-05-26 DIAGNOSIS — S78119A Complete traumatic amputation at level between unspecified hip and knee, initial encounter: Secondary | ICD-10-CM

## 2013-05-26 DIAGNOSIS — F329 Major depressive disorder, single episode, unspecified: Secondary | ICD-10-CM

## 2013-05-26 DIAGNOSIS — J9601 Acute respiratory failure with hypoxia: Secondary | ICD-10-CM

## 2013-05-26 DIAGNOSIS — M86659 Other chronic osteomyelitis, unspecified thigh: Secondary | ICD-10-CM | POA: Diagnosis present

## 2013-05-26 DIAGNOSIS — N390 Urinary tract infection, site not specified: Secondary | ICD-10-CM

## 2013-05-26 DIAGNOSIS — M8668 Other chronic osteomyelitis, other site: Secondary | ICD-10-CM | POA: Diagnosis present

## 2013-05-26 DIAGNOSIS — L8994 Pressure ulcer of unspecified site, stage 4: Secondary | ICD-10-CM | POA: Diagnosis present

## 2013-05-26 LAB — URINE MICROSCOPIC-ADD ON

## 2013-05-26 LAB — CBC WITH DIFFERENTIAL/PLATELET
Eosinophils Absolute: 0.1 10*3/uL (ref 0.0–0.7)
Eosinophils Relative: 1 % (ref 0–5)
Hemoglobin: 10.7 g/dL — ABNORMAL LOW (ref 13.0–17.0)
Lymphocytes Relative: 20 % (ref 12–46)
Lymphs Abs: 1.2 10*3/uL (ref 0.7–4.0)
MCH: 25.8 pg — ABNORMAL LOW (ref 26.0–34.0)
MCV: 83.1 fL (ref 78.0–100.0)
Monocytes Relative: 5 % (ref 3–12)
Neutrophils Relative %: 74 % (ref 43–77)
RBC: 4.15 MIL/uL — ABNORMAL LOW (ref 4.22–5.81)
WBC: 6.3 10*3/uL (ref 4.0–10.5)

## 2013-05-26 LAB — URINALYSIS, ROUTINE W REFLEX MICROSCOPIC
Bilirubin Urine: NEGATIVE
Ketones, ur: NEGATIVE mg/dL
Nitrite: POSITIVE — AB
Protein, ur: 30 mg/dL — AB
Urobilinogen, UA: 0.2 mg/dL (ref 0.0–1.0)
pH: 7.5 (ref 5.0–8.0)

## 2013-05-26 LAB — COMPREHENSIVE METABOLIC PANEL
ALT: 7 U/L (ref 0–53)
Alkaline Phosphatase: 189 U/L — ABNORMAL HIGH (ref 39–117)
BUN: 14 mg/dL (ref 6–23)
CO2: 30 mEq/L (ref 19–32)
GFR calc Af Amer: >90 mL/min (ref >90)
GFR calc non Af Amer: >90 mL/min (ref >90)
Glucose, Bld: 104 mg/dL — ABNORMAL HIGH (ref 70–99)
Potassium: 3.8 mEq/L (ref 3.5–5.1)
Total Protein: 7.8 g/dL (ref 6.0–8.3)

## 2013-05-26 MED ORDER — OXYCODONE HCL 5 MG PO TABS
30.0000 mg | ORAL_TABLET | ORAL | Status: DC | PRN
  Administered 2013-05-27 – 2013-05-30 (×21): 30 mg via ORAL
  Filled 2013-05-26 (×21): qty 6

## 2013-05-26 MED ORDER — PANTOPRAZOLE SODIUM 40 MG PO TBEC
40.0000 mg | DELAYED_RELEASE_TABLET | Freq: Every day | ORAL | Status: DC
  Administered 2013-05-27 – 2013-05-30 (×4): 40 mg via ORAL
  Filled 2013-05-26 (×4): qty 1

## 2013-05-26 MED ORDER — SODIUM CHLORIDE 0.9 % IV SOLN
Freq: Once | INTRAVENOUS | Status: AC
  Administered 2013-05-26: 23:00:00 via INTRAVENOUS

## 2013-05-26 MED ORDER — CARISOPRODOL 350 MG PO TABS
350.0000 mg | ORAL_TABLET | Freq: Three times a day (TID) | ORAL | Status: DC
  Administered 2013-05-27 – 2013-05-30 (×11): 350 mg via ORAL
  Filled 2013-05-26 (×11): qty 1

## 2013-05-26 MED ORDER — FERROUS SULFATE 325 (65 FE) MG PO TABS
325.0000 mg | ORAL_TABLET | Freq: Two times a day (BID) | ORAL | Status: DC
  Administered 2013-05-27 – 2013-05-30 (×7): 325 mg via ORAL
  Filled 2013-05-26 (×7): qty 1

## 2013-05-26 MED ORDER — STERILE WATER FOR INJECTION IJ SOLN
INTRAMUSCULAR | Status: AC
  Administered 2013-05-26: 10 mL
  Filled 2013-05-26: qty 10

## 2013-05-26 MED ORDER — HYDROMORPHONE HCL PF 1 MG/ML IJ SOLN
1.0000 mg | Freq: Once | INTRAMUSCULAR | Status: AC
  Administered 2013-05-26: 1 mg via INTRAMUSCULAR
  Filled 2013-05-26: qty 1

## 2013-05-26 MED ORDER — DULOXETINE HCL 30 MG PO CPEP
30.0000 mg | ORAL_CAPSULE | Freq: Every evening | ORAL | Status: DC
  Administered 2013-05-27 – 2013-05-29 (×3): 30 mg via ORAL
  Filled 2013-05-26 (×3): qty 1

## 2013-05-26 MED ORDER — ACETAMINOPHEN 325 MG PO TABS
650.0000 mg | ORAL_TABLET | ORAL | Status: DC | PRN
  Filled 2013-05-26: qty 2

## 2013-05-26 MED ORDER — ENSURE COMPLETE PO LIQD
237.0000 mL | Freq: Four times a day (QID) | ORAL | Status: DC
  Administered 2013-05-27 – 2013-05-29 (×11): 237 mL via ORAL

## 2013-05-26 MED ORDER — CEFTRIAXONE SODIUM 1 G IJ SOLR
1.0000 g | Freq: Once | INTRAMUSCULAR | Status: AC
  Administered 2013-05-26: 1 g via INTRAMUSCULAR
  Filled 2013-05-26: qty 10

## 2013-05-26 MED ORDER — ALPRAZOLAM 1 MG PO TABS
1.0000 mg | ORAL_TABLET | Freq: Three times a day (TID) | ORAL | Status: DC
  Administered 2013-05-27 – 2013-05-30 (×11): 1 mg via ORAL
  Filled 2013-05-26 (×11): qty 1

## 2013-05-26 MED ORDER — OXYCODONE HCL ER 80 MG PO T12A
100.0000 mg | EXTENDED_RELEASE_TABLET | Freq: Two times a day (BID) | ORAL | Status: DC
  Administered 2013-05-27 – 2013-05-30 (×8): 100 mg via ORAL
  Filled 2013-05-26 (×16): qty 1

## 2013-05-26 MED ORDER — PHENOBARBITAL 32.4 MG PO TABS
64.8000 mg | ORAL_TABLET | Freq: Every day | ORAL | Status: DC
  Administered 2013-05-27 – 2013-05-29 (×3): 64.8 mg via ORAL
  Filled 2013-05-26 (×3): qty 2
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
## 2013-05-26 NOTE — ED Notes (Signed)
Per ems, pt reports falling in between his bed and wheelchair.  Pt denies hitting his head or any LOC.  Pt reports pain to his back and left hip.  Pt also reports n/v/d for the past two days.
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
## 2013-05-26 NOTE — ED Notes (Signed)
MD made aware that pt requesting pain medication. Awaiting order

## 2013-05-26 NOTE — ED Notes (Signed)
Report called to Artist Pais, RN on med/surg. Pt transported by Verdis Frederickson, ED tech to [id number]. Ns continues to infuse. No signs/symptoms of infiltration noted.

## 2013-05-26 NOTE — ED Notes (Signed)
Pt continues to refuse IV attempt. Also refused placement by Shanda Bumps, RN

## 2013-05-27 ENCOUNTER — Encounter (HOSPITAL_COMMUNITY): Payer: Self-pay | Admitting: Internal Medicine

## 2013-05-27 DIAGNOSIS — Z9889 Other specified postprocedural states: Secondary | ICD-10-CM

## 2013-05-27 DIAGNOSIS — J9600 Acute respiratory failure, unspecified whether with hypoxia or hypercapnia: Secondary | ICD-10-CM

## 2013-05-27 DIAGNOSIS — N390 Urinary tract infection, site not specified: Secondary | ICD-10-CM

## 2013-05-27 DIAGNOSIS — D649 Anemia, unspecified: Secondary | ICD-10-CM

## 2013-05-27 DIAGNOSIS — M8668 Other chronic osteomyelitis, other site: Secondary | ICD-10-CM

## 2013-05-27 LAB — BASIC METABOLIC PANEL
CO2: 28 mEq/L (ref 19–32)
Chloride: 98 mEq/L (ref 96–112)
Creatinine, Ser: 0.64 mg/dL (ref 0.50–1.35)
GFR calc Af Amer: >90 mL/min (ref >90)
GFR calc non Af Amer: >90 mL/min (ref >90)
Sodium: 136 mEq/L (ref 135–145)

## 2013-05-27 LAB — HEPATIC FUNCTION PANEL
ALT: 6 U/L (ref 0–53)
Albumin: 3.1 g/dL — ABNORMAL LOW (ref 3.5–5.2)
Alkaline Phosphatase: 186 U/L — ABNORMAL HIGH (ref 39–117)
Bilirubin, Direct: <0.1 mg/dL (ref 0.0–0.3)
Total Protein: 7.9 g/dL (ref 6.0–8.3)

## 2013-05-27 LAB — URINE CULTURE

## 2013-05-27 LAB — CBC
HCT: 31.7 % — ABNORMAL LOW (ref 39.0–52.0)
Platelets: 267 10*3/uL (ref 150–400)
RBC: 3.8 MIL/uL — ABNORMAL LOW (ref 4.22–5.81)
RDW: 19.1 % — ABNORMAL HIGH (ref 11.5–15.5)
WBC: 6.9 10*3/uL (ref 4.0–10.5)

## 2013-05-27 MED ORDER — PHENOBARBITAL 32.4 MG PO TABS
32.4000 mg | ORAL_TABLET | Freq: Every day | ORAL | Status: DC
  Administered 2013-05-27 – 2013-05-30 (×4): 32.4 mg via ORAL
  Filled 2013-05-27 (×4): qty 1

## 2013-05-27 MED ORDER — ALBUTEROL SULFATE (5 MG/ML) 0.5% IN NEBU: 2.5000 mg | INHALATION_SOLUTION | Freq: Four times a day (QID) | RESPIRATORY_TRACT | Status: DC | PRN

## 2013-05-27 MED ORDER — ENOXAPARIN SODIUM 30 MG/0.3ML ~~LOC~~ SOLN: 30.0000 mg | SUBCUTANEOUS | Status: DC

## 2013-05-27 MED ORDER — ENOXAPARIN SODIUM 40 MG/0.4ML ~~LOC~~ SOLN
40.0000 mg | Freq: Every day | SUBCUTANEOUS | Status: DC
  Filled 2013-05-27 (×3): qty 0.4

## 2013-05-27 MED ORDER — POTASSIUM CHLORIDE IN NACL 20-0.9 MEQ/L-% IV SOLN
INTRAVENOUS | Status: DC
  Administered 2013-05-27 – 2013-05-28 (×4): via INTRAVENOUS

## 2013-05-27 MED ORDER — IPRATROPIUM BROMIDE 0.02 % IN SOLN: 0.5000 mg | Freq: Four times a day (QID) | RESPIRATORY_TRACT | Status: DC | PRN

## 2013-05-27 NOTE — Clinical Social Work Placement (Signed)
Clinical Social Work Department CLINICAL SOCIAL WORK PLACEMENT NOTE 05/27/2013  Patient:  Chris [name], Chris [name]  Account Number:  [id number] Admit date:  05/26/2013  Clinical Social Worker:  Derenda Fennel, LCSW  Date/time:  05/27/2013 10:35 AM  Clinical Social Work is seeking post-discharge placement for this patient at the following level of care:   SKILLED NURSING   (*CSW will update this form in Epic as items are completed)   05/27/2013  Patient/family provided with [facility] Department of Clinical Social Work's list of facilities offering this level of care within the geographic area requested by the patient (or if unable, by the patient's family).  05/27/2013  Patient/family informed of their freedom to choose among providers that offer the needed level of care, that participate in Medicare, Medicaid or managed care program needed by the patient, have an available bed and are willing to accept the patient.  05/27/2013  Patient/family informed of [facility]' ownership interest in [facility], as well as of the fact that they are under no obligation to receive care at this facility.  PASARR submitted to EDS on 05/27/2013 PASARR number received from EDS on   FL2 transmitted to all facilities in geographic area requested by pt/family on  05/27/2013 FL2 transmitted to all facilities within larger geographic area on 05/27/2013  Patient informed that his/her managed care company has contracts with or will negotiate with  certain facilities, including the following:     Patient/family informed of bed offers received:   Patient chooses bed at  Physician recommends and patient chooses bed at    Patient to be transferred to  on   Patient to be transferred to facility by   The following physician request were entered in Epic:   Additional Comments:  Derenda Fennel, LCSW [phone]

## 2013-05-27 NOTE — Consult Note (Signed)
WOC wound consult note Reason for Consult: Chronic pressure ulcers to sacrum, and bilateral buttocks. Wound type:Pressure ulcer, present on admission.  Pressure Ulcer POA: Yes Measurement:Right buttock 5 cm x 4 cm x 4 cm, 90% pale pink wound bed, 10% adherent yellow slough.  Left buttocks, not assessed, Hydrocolloid in place and patient states it is shallow. Nurse notes indicate 0.5 cm x 0.5 cm Sacrum, not assessed, hydrocolloid in place patient states is deeper than left buttocks but "not bad". 1 cm x 1cm Wound bed: Right buttocks 90% pale pink and 10% adherent yellow slough.  Drainage (amount, consistency, odor)  Right buttock has moderate drainage, serosanguinous, no odor. Left buttock and sacrum have minimal serous drainage, per nurse notes.  Periwound: Intact  Dressing procedure/placement/frequency: Right buttock wound appears to have bacterial contamination, as evidenced by pale wound bed and chronic wound present.  Begin Aquacel Ag to wound bed, after cleansing.  Top with 4x4 gauze, to fill dead space.  Top with dry dressing and secure with tape. Change daily.   Left buttocks and sacral wound can continue Hydrocolloid dressing.  Pressure redistribution mattress to offload pressure and encourage patient to turn and reposition every 2 hours  WOC nursing team will not follow at this time.  Please re-consult if needed.  Maple Hudson RN BSN [facility]ON Pager [phone]

## 2013-05-27 NOTE — Progress Notes (Signed)
[facility] PROGRESS NOTE  TATSUYA OKRAY WUJ:[id number] DOB: 1956/04/09 DOA: 05/26/2013 PCP: [name], MD  Assessment/Plan: 1. Seizures 1. Resumed home meds 2. Observe for now 2. Chronic pain 1. Cont pain meds 3. Hx of chronic foley cath 1. Asymptomatic pyuria noted 2. Agree with holding abx at this time 3. Observe 4. PTSD 1. On SSRI 2. Stable 5. DVT prophylaxis 1. Lovenox subq  Code Status: Full Family Communication: Pt in room (indicate person spoken with, relationship, and if by phone, the number) Disposition Plan: Pending  HPI/Subjective: No acute events reported. Complains of cont diffuse chronic pain  Objective: Filed Vitals:   05/26/13 2201 05/26/13 2307 05/27/13 0500 05/27/13 0603  BP: 129/70 155/79  111/71  Pulse: 76 73  69  Temp:  98.1 F (36.7 C)  97.9 F (36.6 C)  TempSrc:  Oral  Oral  Resp: 18 18  18   Height:  4' (1.219 m)    Weight:  81.6 kg (179 lb 14.3 oz) 82 kg (180 lb 12.4 oz)   SpO2: 96% 97%  98%    Intake/Output Summary (Last 24 hours) at 05/27/13 0917 Last data filed at 05/27/13 0647  Gross per 24 hour  Intake 720.83 ml  Output   1100 ml  Net -379.17 ml   Filed Weights   05/26/13 1723 05/26/13 2307 05/27/13 0500  Weight: 85.73 kg (189 lb) 81.6 kg (179 lb 14.3 oz) 82 kg (180 lb 12.4 oz)    Exam:   General:  Awake, in nad  Cardiovascular: regular, s1, s2  Respiratory: normal resp effort, no wheezing  Abdomen: soft, ostomy bag in place, nondistended  Musculoskeletal: s/p B LE amputation, perfused.   Data Reviewed: Basic Metabolic Panel:  Recent Labs Lab 05/26/13 1814 05/27/13 0609  NA 138 136  K 3.8 3.4*  CL 97 98  CO2 30 28  GLUCOSE 104* 95  BUN 14 11  CREATININE 0.59 0.64  CALCIUM 9.6 8.7   Liver Function Tests:  Recent Labs Lab 05/26/13 1814 05/27/13 0118  AST 12 14  ALT 7 6  ALKPHOS 189* 186*  BILITOT 0.2* 0.2*  PROT 7.8 7.9  ALBUMIN 3.0* 3.1*   No results found for this basename:  LIPASE, AMYLASE,  in the last 168 hours No results found for this basename: AMMONIA,  in the last 168 hours CBC:  Recent Labs Lab 05/26/13 1814 05/27/13 0609  WBC 6.3 6.9  NEUTROABS 4.7  --   HGB 10.7* 10.0*  HCT 34.5* 31.7*  MCV 83.1 83.4  PLT 286 267   Cardiac Enzymes: No results found for this basename: CKTOTAL, CKMB, CKMBINDEX, TROPONINI,  in the last 168 hours BNP (last 3 results) No results found for this basename: PROBNP,  in the last 8760 hours CBG: No results found for this basename: GLUCAP,  in the last 168 hours  No results found for this or any previous visit (from the past 240 hour(s)).   Studies: Dg Chest 1 View  05/26/2013   CLINICAL DATA:  Larey Seat.  Chest pain.  EXAM: CHEST - 1 VIEW  COMPARISON:  02/07/2013.  FINDINGS: The cardiac silhouette, mediastinal and hilar contours are Stable, vertically given the supine position of the patient. The lungs are clear. No pneumothorax. Multiple remote appearing left rib fractures are noted.  IMPRESSION: No acute cardiopulmonary findings.  Multiple remote appearing left rib fractures.   Electronically Signed   By: Loralie Champagne M.D.   On: 05/26/2013 19:08   Dg Lumbar Spine Complete  05/26/2013   CLINICAL DATA:  Larey Seat. Back pain.  EXAM: LUMBAR SPINE - COMPLETE 4+ VIEW  COMPARISON:  CT scan 03/17/2013.  FINDINGS: All the lumbar vertebral bodies are normally aligned. No acute fracture. An IVC filter is noted. Advanced facet disease but no definite pars defects. The visualized bony pelvis is intact.  IMPRESSION: Normal alignment and no acute bony findings.   Electronically Signed   By: Loralie Champagne M.D.   On: 05/26/2013 19:13   Dg Hip Complete Left  05/26/2013   CLINICAL DATA:  Larey Seat. Left hip pain.  EXAM: LEFT HIP - COMPLETE 2+ VIEW  COMPARISON:  CT scan 03/17/2013.  FINDINGS: The left hip hardware appears stable. The left femoral head is necrotic and the dynamic hip screw is extending through the cortex and into the joint. No acute  hip fracture. The visualized bony pelvis is intact.  IMPRESSION: Remote fracture fixation with a necrotic left femoral head.  No acute fracture.   Electronically Signed   By: Loralie Champagne M.D.   On: 05/26/2013 19:10    Scheduled Meds: . ALPRAZolam  1 mg Oral TID  . carisoprodol  350 mg Oral TID  . DULoxetine  30 mg Oral QPM  . enoxaparin (LOVENOX) injection  40 mg Subcutaneous Daily  . feeding supplement (ENSURE COMPLETE)  237 mL Oral QID  . ferrous sulfate  325 mg Oral BID WC  . OxyCODONE  100 mg Oral Q12H  . pantoprazole  40 mg Oral Q1200  . phenobarbital  32.4 mg Oral QAC breakfast  . PHENobarbital  64.8 mg Oral q1800   Continuous Infusions: . 0.9 % NaCl with KCl 20 mEq / L 100 mL/hr at 05/27/13 0221    Active Problems:   Seizure disorder   Depression with anxiety   S/P AKA (above knee amputation) bilateral   Colostomy status   Chronic indwelling Foley catheter   PTSD (post-traumatic stress disorder)   Decubitus ulcer of sacral region, stage 4   Decubitus ulcer of right buttock, stage 4   Chronic pain syndrome   Left hip pain   Chronic osteomyelitis, other specified site   Nausea and vomiting   Normocytic anemia  Time spent:  Lauraann Missey K  [facility] Pager [phone]. If 7PM-7AM, please contact night-coverage at [email or web address], password [facility] 05/27/2013, 9:17 AM  LOS: 1 day
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
## 2013-05-27 NOTE — Progress Notes (Signed)
UR Chart Review Completed  

## 2013-05-28 DIAGNOSIS — L8991 Pressure ulcer of unspecified site, stage 1: Secondary | ICD-10-CM

## 2013-05-28 DIAGNOSIS — L89309 Pressure ulcer of unspecified buttock, unspecified stage: Secondary | ICD-10-CM

## 2013-05-28 DIAGNOSIS — G894 Chronic pain syndrome: Secondary | ICD-10-CM

## 2013-05-28 NOTE — Clinical Social Work Note (Signed)
CSW presented bed offer at [facility] in [place]. Pt was hoping to be closer to family, but states he has to be in a facility. Willing to accept bed. Received notification of face-to-face evaluation need for pasarr. MD aware. Pt is stable when pasarr received for transfer to SNF. Pennie Rushing in admissions will complete Medicaid prior approval at their request. CSW to continue to follow.  Derenda Fennel, [place] [phone]

## 2013-05-28 NOTE — Progress Notes (Signed)
[facility] PROGRESS NOTE  Chris [name] YQM:[id number] DOB: 10-28-1955 DOA: 05/26/2013 PCP: [name], MD  Assessment/Plan: 1. Seizures -Resumed home meds -no witnessed seizures  2. Chronic pain -Cont pain meds -Xrays without acute fractures or dislocations  3. Hx of chronic foley cath -Asymptomatic pyuria noted - no indication for Abx at this time  4. PTSD -On SSRI  5.   Sacral decub       -wound care  DVT prophylaxis -Lovenox subq  Code Status: Full Family Communication: Pt in room  Disposition Plan: SNF pending  HPI/Subjective: No acute events reported. Complains of cont diffuse chronic pain  Objective: Filed Vitals:   05/28/13 0211 05/28/13 0421 05/28/13 0555 05/28/13 1430  BP:   124/64 110/61  Pulse:   75 77  Temp:   98 F (36.7 C) 98.5 F (36.9 C)  TempSrc:   Oral Oral  Resp:   18 18  Height:      Weight:  87.8 kg (193 lb 9 oz)    SpO2: 98%  99% 100%    Intake/Output Summary (Last 24 hours) at 05/28/13 1630 Last data filed at 05/28/13 1543  Gross per 24 hour  Intake 3291.67 ml  Output   4300 ml  Net -1008.33 ml   Filed Weights   05/26/13 2307 05/27/13 0500 05/28/13 0421  Weight: 81.6 kg (179 lb 14.3 oz) 82 kg (180 lb 12.4 oz) 87.8 kg (193 lb 9 oz)    Exam:   General:  Awake, in nad  Cardiovascular: regular, s1, s2  Respiratory: normal resp effort, no wheezing  Abdomen: soft, ostomy bag in place, nondistended  Musculoskeletal: s/p B LE amputation, perfused.   Data Reviewed: Basic Metabolic Panel:  Recent Labs Lab 05/26/13 1814 05/27/13 0609  NA 138 136  K 3.8 3.4*  CL 97 98  CO2 30 28  GLUCOSE 104* 95  BUN 14 11  CREATININE 0.59 0.64  CALCIUM 9.6 8.7   Liver Function Tests:  Recent Labs Lab 05/26/13 1814 05/27/13 0118  AST 12 14  ALT 7 6  ALKPHOS 189* 186*  BILITOT 0.2* 0.2*  PROT 7.8 7.9  ALBUMIN 3.0* 3.1*   No results found for this basename: LIPASE, AMYLASE,  in the last 168 hours No  results found for this basename: AMMONIA,  in the last 168 hours CBC:  Recent Labs Lab 05/26/13 1814 05/27/13 0609  WBC 6.3 6.9  NEUTROABS 4.7  --   HGB 10.7* 10.0*  HCT 34.5* 31.7*  MCV 83.1 83.4  PLT 286 267   Cardiac Enzymes: No results found for this basename: CKTOTAL, CKMB, CKMBINDEX, TROPONINI,  in the last 168 hours BNP (last 3 results) No results found for this basename: PROBNP,  in the last 8760 hours CBG: No results found for this basename: GLUCAP,  in the last 168 hours  Recent Results (from the past 240 hour(s))  URINE CULTURE     Status: None   Collection Time    05/26/13  7:33 PM      Result Value Range Status   Specimen Description URINE, CLEAN CATCH   Final   Special Requests NONE   Final   Culture  Setup Time     Final   Value: 05/26/2013 23:30     Performed at [facility] Count     Final   Value: >=100,000 COLONIES/ML     Performed at [facility]   Culture     Final  Value: Multiple bacterial morphotypes present, none predominant. Suggest appropriate recollection if clinically indicated.     Performed at [facility]   Report Status 05/27/2013 FINAL   Final     Studies: Dg Chest 1 View  05/26/2013   CLINICAL DATA:  Larey Seat.  Chest pain.  EXAM: CHEST - 1 VIEW  COMPARISON:  02/07/2013.  FINDINGS: The cardiac silhouette, mediastinal and hilar contours are Stable, vertically given the supine position of the patient. The lungs are clear. No pneumothorax. Multiple remote appearing left rib fractures are noted.  IMPRESSION: No acute cardiopulmonary findings.  Multiple remote appearing left rib fractures.   Electronically Signed   By: Loralie Champagne M.D.   On: 05/26/2013 19:08   Dg Lumbar Spine Complete  05/26/2013   CLINICAL DATA:  Larey Seat. Back pain.  EXAM: LUMBAR SPINE - COMPLETE 4+ VIEW  COMPARISON:  CT scan 03/17/2013.  FINDINGS: All the lumbar vertebral bodies are normally aligned. No acute fracture. An IVC filter is noted.  Advanced facet disease but no definite pars defects. The visualized bony pelvis is intact.  IMPRESSION: Normal alignment and no acute bony findings.   Electronically Signed   By: Loralie Champagne M.D.   On: 05/26/2013 19:13   Dg Hip Complete Left  05/26/2013   CLINICAL DATA:  Larey Seat. Left hip pain.  EXAM: LEFT HIP - COMPLETE 2+ VIEW  COMPARISON:  CT scan 03/17/2013.  FINDINGS: The left hip hardware appears stable. The left femoral head is necrotic and the dynamic hip screw is extending through the cortex and into the joint. No acute hip fracture. The visualized bony pelvis is intact.  IMPRESSION: Remote fracture fixation with a necrotic left femoral head.  No acute fracture.   Electronically Signed   By: Loralie Champagne M.D.   On: 05/26/2013 19:10    Scheduled Meds: . ALPRAZolam  1 mg Oral TID  . carisoprodol  350 mg Oral TID  . DULoxetine  30 mg Oral QPM  . enoxaparin (LOVENOX) injection  40 mg Subcutaneous Daily  . feeding supplement (ENSURE COMPLETE)  237 mL Oral QID  . ferrous sulfate  325 mg Oral BID WC  . OxyCODONE  100 mg Oral Q12H  . pantoprazole  40 mg Oral Q1200  . phenobarbital  32.4 mg Oral QAC breakfast  . PHENobarbital  64.8 mg Oral q1800   Continuous Infusions:    Active Problems:   Seizure disorder   Depression with anxiety   S/P AKA (above knee amputation) bilateral   Colostomy status   Chronic indwelling Foley catheter   PTSD (post-traumatic stress disorder)   Decubitus ulcer of sacral region, stage 4   Decubitus ulcer of right buttock, stage 4   Chronic pain syndrome   Left hip pain   Chronic osteomyelitis, other specified site   Nausea and vomiting   Normocytic anemia  Time spent:  [facility]  [facility] Pager [phone]. If 7PM-7AM, please contact night-coverage at [email or web address], password [facility] 05/28/2013, 4:30 PM  LOS: 2 days

## 2013-05-28 NOTE — Clinical Social Work Placement (Signed)
Clinical Social Work Department CLINICAL SOCIAL WORK PLACEMENT NOTE 05/28/2013  Patient:  Chris [name], Chris [name]  Account Number:  [id number] Admit date:  05/26/2013  Clinical Social Worker:  Derenda Fennel, LCSW  Date/time:  05/27/2013 10:35 AM  Clinical Social Work is seeking post-discharge placement for this patient at the following level of care:   SKILLED NURSING   (*CSW will update this form in Epic as items are completed)   05/27/2013  Patient/family provided with [facility] Department of Clinical Social Work's list of facilities offering this level of care within the geographic area requested by the patient (or if unable, by the patient's family).  05/27/2013  Patient/family informed of their freedom to choose among providers that offer the needed level of care, that participate in Medicare, Medicaid or managed care program needed by the patient, have an available bed and are willing to accept the patient.  05/27/2013  Patient/family informed of [facility]' ownership interest in [facility], as well as of the fact that they are under no obligation to receive care at this facility.  PASARR submitted to [facility] on 05/27/2013 PASARR number received from [facility] on   FL2 transmitted to all facilities in geographic area requested by pt/family on  05/27/2013 FL2 transmitted to all facilities within larger geographic area on 05/27/2013  Patient informed that his/her managed care company has contracts with or will negotiate with  certain facilities, including the following:     Patient/family informed of bed offers received:  05/28/2013 Patient chooses bed at OTHER Physician recommends and patient chooses bed at  OTHER  Patient to be transferred to  on   Patient to be transferred to facility by   The following physician request were entered in Epic:   Additional Comments: [employer]. Face-to-face evaluation for pasarr.  Derenda Fennel, [place] [phone]

## 2013-05-29 DIAGNOSIS — Z933 Colostomy status: Secondary | ICD-10-CM

## 2013-05-29 NOTE — Progress Notes (Signed)
[facility] PROGRESS NOTE  Chris [name]:[id number] DOB: October 07, 1955 DOA: 05/26/2013 PCP: [name], MD  Assessment/Plan: 1. Seizures -resumed home meds-phenobarb -no witnessed seizures  2. Chronic pain -Cont pain meds -Xrays without acute fractures or dislocations  3. Hx of chronic foley cath -Asymptomatic pyuria noted - no indication for Abx at this time, urine Cx polymicorbial  4. PTSD -On SSRI  5.   Sacral decub       -wound care  DVT prophylaxis -Lovenox subq  Code Status: Full Family Communication: Pt in room  Disposition Plan: SNF pending  HPI/Subjective: No acute events reported. Complains of cont diffuse chronic pain  Objective: Filed Vitals:   05/28/13 1749 05/28/13 2137 05/29/13 0217 05/29/13 0647  BP: 107/70 107/64 106/50 98/47  Pulse: 88 81 90 85  Temp: 98.7 F (37.1 C) 97.8 F (36.6 C) 97.2 F (36.2 C) 98.1 F (36.7 C)  TempSrc: Axillary Oral Oral Oral  Resp: 20 18 18 18   Height:      Weight:    85.7 kg (188 lb 15 oz)  SpO2: 96% 98% 93% 98%    Intake/Output Summary (Last 24 hours) at 05/29/13 1455 Last data filed at 05/29/13 0600  Gross per 24 hour  Intake    240 ml  Output   4150 ml  Net  -3910 ml   Filed Weights   05/27/13 0500 05/28/13 0421 05/29/13 0647  Weight: 82 kg (180 lb 12.4 oz) 87.8 kg (193 lb 9 oz) 85.7 kg (188 lb 15 oz)    Exam:   General:  Awake, in nad  Cardiovascular: regular, s1, s2  Respiratory: normal resp effort, no wheezing  Abdomen: soft, ostomy bag in place, nondistended  Musculoskeletal: s/p B LE amputation, perfused.   Data Reviewed: Basic Metabolic Panel:  Recent Labs Lab 05/26/13 1814 05/27/13 0609  NA 138 136  K 3.8 3.4*  CL 97 98  CO2 30 28  GLUCOSE 104* 95  BUN 14 11  CREATININE 0.59 0.64  CALCIUM 9.6 8.7   Liver Function Tests:  Recent Labs Lab 05/26/13 1814 05/27/13 0118  AST 12 14  ALT 7 6  ALKPHOS 189* 186*  BILITOT 0.2* 0.2*  PROT 7.8 7.9  ALBUMIN  3.0* 3.1*   No results found for this basename: LIPASE, AMYLASE,  in the last 168 hours No results found for this basename: AMMONIA,  in the last 168 hours CBC:  Recent Labs Lab 05/26/13 1814 05/27/13 0609  WBC 6.3 6.9  NEUTROABS 4.7  --   HGB 10.7* 10.0*  HCT 34.5* 31.7*  MCV 83.1 83.4  PLT 286 267   Cardiac Enzymes: No results found for this basename: CKTOTAL, CKMB, CKMBINDEX, TROPONINI,  in the last 168 hours BNP (last 3 results) No results found for this basename: PROBNP,  in the last 8760 hours CBG: No results found for this basename: GLUCAP,  in the last 168 hours  Recent Results (from the past 240 hour(s))  URINE CULTURE     Status: None   Collection Time    05/26/13  7:33 PM      Result Value Range Status   Specimen Description URINE, CLEAN CATCH   Final   Special Requests NONE   Final   Culture  Setup Time     Final   Value: 05/26/2013 23:30     Performed at [facility] Count     Final   Value: >=100,000 COLONIES/ML     Performed at  [facility]     Final   Value: Multiple bacterial morphotypes present, none predominant. Suggest appropriate recollection if clinically indicated.     Performed at [facility]   Report Status 05/27/2013 FINAL   Final  MRSA PCR SCREENING     Status: None   Collection Time    05/28/13  3:50 PM      Result Value Range Status   MRSA by PCR NEGATIVE  NEGATIVE Final   Comment:            The GeneXpert MRSA Assay (FDA     approved for NASAL specimens     only), is one component of a     comprehensive MRSA colonization     surveillance program. It is not     intended to diagnose MRSA     infection nor to guide or     monitor treatment for     MRSA infections.     Studies: No results found.  Scheduled Meds: . ALPRAZolam  1 mg Oral TID  . carisoprodol  350 mg Oral TID  . DULoxetine  30 mg Oral QPM  . enoxaparin (LOVENOX) injection  40 mg Subcutaneous Daily  . feeding supplement  (ENSURE COMPLETE)  237 mL Oral QID  . ferrous sulfate  325 mg Oral BID WC  . OxyCODONE  100 mg Oral Q12H  . pantoprazole  40 mg Oral Q1200  . phenobarbital  32.4 mg Oral QAC breakfast  . PHENobarbital  64.8 mg Oral q1800   Continuous Infusions:    Active Problems:   Seizure disorder   Depression with anxiety   S/P AKA (above knee amputation) bilateral   Colostomy status   Chronic indwelling Foley catheter   PTSD (post-traumatic stress disorder)   Decubitus ulcer of sacral region, stage 4   Decubitus ulcer of right buttock, stage 4   Chronic pain syndrome   Left hip pain   Chronic osteomyelitis, other specified site   Nausea and vomiting   Normocytic anemia  Time spent:  [facility]  [facility] Pager [phone]. If 7PM-7AM, please contact night-coverage at [email or web address], password [facility] 05/29/2013, 2:55 PM  LOS: 3 days

## 2013-05-29 NOTE — Clinical Social Work Note (Signed)
Pt had face-to-face evaluation today for pasarr. Updated [employer]. CSW awaiting pasarr number for transfer to SNF.  Derenda Fennel, [place] [phone]

## 2013-05-29 NOTE — Progress Notes (Signed)
Offered to empty colostomy. Patient declined. Stated, "it doesn't need to be emptied".

## 2013-05-30 DIAGNOSIS — F329 Major depressive disorder, single episode, unspecified: Secondary | ICD-10-CM

## 2013-05-30 DIAGNOSIS — L8994 Pressure ulcer of unspecified site, stage 4: Secondary | ICD-10-CM

## 2013-05-30 MED ORDER — OXYCODONE HCL 30 MG PO TABS: 30.0000 mg | ORAL_TABLET | ORAL | Status: DC | PRN

## 2013-05-30 MED ORDER — ALPRAZOLAM 1 MG PO TABS: 1.0000 mg | ORAL_TABLET | Freq: Three times a day (TID) | ORAL | Status: AC

## 2013-05-30 MED ORDER — OXYCODONE HCL ER 20 MG PO T12A: 100.0000 mg | EXTENDED_RELEASE_TABLET | Freq: Two times a day (BID) | ORAL | Status: AC

## 2013-05-30 MED ORDER — ALBUTEROL SULFATE (5 MG/ML) 0.5% IN NEBU: 2.5000 mg | INHALATION_SOLUTION | RESPIRATORY_TRACT | Status: AC | PRN

## 2013-05-30 NOTE — Progress Notes (Signed)
Pt discharged to be transferred to [facility].  Report was called to Trenton Gammon and the H&P was faxed to [phone] as requested by her.  Pt left the floor via stretcher with the [facility] staff in stable condition.  The colostomy was emptied prior to d/c.
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
## 2013-05-30 NOTE — Clinical Social Work Placement (Signed)
Clinical Social Work Department CLINICAL SOCIAL WORK PLACEMENT NOTE 05/30/2013  Patient:  Chris [name], Chris [name]  Account Number:  [id number] Admit date:  05/26/2013  Clinical Social Worker:  Derenda Fennel, LCSW  Date/time:  05/27/2013 10:35 AM  Clinical Social Work is seeking post-discharge placement for this patient at the following level of care:   SKILLED NURSING   (*CSW will update this form in Epic as items are completed)   05/27/2013  Patient/family provided with [facility] Department of Clinical Social Work's list of facilities offering this level of care within the geographic area requested by the patient (or if unable, by the patient's family).  05/27/2013  Patient/family informed of their freedom to choose among providers that offer the needed level of care, that participate in Medicare, Medicaid or managed care program needed by the patient, have an available bed and are willing to accept the patient.  05/27/2013  Patient/family informed of [facility]' ownership interest in [facility], as well as of the fact that they are under no obligation to receive care at this facility.  PASARR submitted to EDS on 05/27/2013 PASARR number received from EDS on   FL2 transmitted to all facilities in geographic area requested by pt/family on  05/27/2013 FL2 transmitted to all facilities within larger geographic area on 05/27/2013  Patient informed that his/her managed care company has contracts with or will negotiate with  certain facilities, including the following:     Patient/family informed of bed offers received:  05/28/2013 Patient chooses bed at OTHER Physician recommends and patient chooses bed at  OTHER  Patient to be transferred to OTHER on  05/30/2013 Patient to be transferred to facility by [facility]  The following physician request were entered in Epic:   Additional Comments: [employer]. Face-to-face evaluation for pasarr.  Derenda Fennel,  [place] [phone]

## 2013-05-30 NOTE — Clinical Social Work Note (Addendum)
60 day pasarr received this morning. [facility] aware of expiration date. Pt stable for transfer to [facility] in [facility]. Pt reports [facility] spoke with him yesterday and indicated they could take him if he applied for disability. CSW spoke with Eunice Blase who reports that his disability would have to be approved first. Pt aware of above and agreeable to [facility]. Pt to transfer via [facility] as he is unable to sit up due to decubitus ulcer. D/C summary faxed.  Derenda Fennel, [place] [phone]
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
## 2013-07-12 ENCOUNTER — Encounter: Payer: Self-pay | Admitting: Internal Medicine

## 2013-07-20 NOTE — Progress Notes (Signed)
This encounter was created in error - please disregard.

## 2013-08-30 ENCOUNTER — Encounter: Payer: Self-pay | Admitting: *Deleted

## 2013-09-28 ENCOUNTER — Emergency Department (HOSPITAL_COMMUNITY)
Admission: EM | Admit: 2013-09-28 | Discharge: 2013-09-29 | Disposition: A | Discharge status: Home / Self Care | Payer: Medicaid Other | Attending: Emergency Medicine | Admitting: Emergency Medicine

## 2013-09-28 ENCOUNTER — Encounter (HOSPITAL_COMMUNITY): Payer: Self-pay | Admitting: Emergency Medicine

## 2013-09-28 DIAGNOSIS — Z9889 Other specified postprocedural states: Secondary | ICD-10-CM | POA: Insufficient documentation

## 2013-09-28 DIAGNOSIS — G894 Chronic pain syndrome: Secondary | ICD-10-CM | POA: Insufficient documentation

## 2013-09-28 DIAGNOSIS — L89109 Pressure ulcer of unspecified part of back, unspecified stage: Secondary | ICD-10-CM | POA: Insufficient documentation

## 2013-09-28 DIAGNOSIS — F411 Generalized anxiety disorder: Secondary | ICD-10-CM | POA: Insufficient documentation

## 2013-09-28 DIAGNOSIS — G8929 Other chronic pain: Secondary | ICD-10-CM | POA: Insufficient documentation

## 2013-09-28 DIAGNOSIS — G40909 Epilepsy, unspecified, not intractable, without status epilepticus: Secondary | ICD-10-CM | POA: Insufficient documentation

## 2013-09-28 DIAGNOSIS — Z8739 Personal history of other diseases of the musculoskeletal system and connective tissue: Secondary | ICD-10-CM | POA: Insufficient documentation

## 2013-09-28 DIAGNOSIS — L8994 Pressure ulcer of unspecified site, stage 4: Secondary | ICD-10-CM | POA: Insufficient documentation

## 2013-09-28 DIAGNOSIS — Z8614 Personal history of Methicillin resistant Staphylococcus aureus infection: Secondary | ICD-10-CM | POA: Insufficient documentation

## 2013-09-28 DIAGNOSIS — F1310 Sedative, hypnotic or anxiolytic abuse, uncomplicated: Secondary | ICD-10-CM | POA: Insufficient documentation

## 2013-09-28 DIAGNOSIS — S88119A Complete traumatic amputation at level between knee and ankle, unspecified lower leg, initial encounter: Secondary | ICD-10-CM | POA: Insufficient documentation

## 2013-09-28 DIAGNOSIS — Z8619 Personal history of other infectious and parasitic diseases: Secondary | ICD-10-CM | POA: Insufficient documentation

## 2013-09-28 DIAGNOSIS — Z87828 Personal history of other (healed) physical injury and trauma: Secondary | ICD-10-CM | POA: Insufficient documentation

## 2013-09-28 DIAGNOSIS — F1110 Opioid abuse, uncomplicated: Secondary | ICD-10-CM | POA: Insufficient documentation

## 2013-09-28 DIAGNOSIS — IMO0002 Reserved for concepts with insufficient information to code with codable children: Secondary | ICD-10-CM

## 2013-09-28 DIAGNOSIS — F329 Major depressive disorder, single episode, unspecified: Secondary | ICD-10-CM | POA: Insufficient documentation

## 2013-09-28 DIAGNOSIS — I10 Essential (primary) hypertension: Secondary | ICD-10-CM | POA: Insufficient documentation

## 2013-09-28 DIAGNOSIS — Z79899 Other long term (current) drug therapy: Secondary | ICD-10-CM | POA: Insufficient documentation

## 2013-09-28 DIAGNOSIS — Z933 Colostomy status: Secondary | ICD-10-CM | POA: Insufficient documentation

## 2013-09-28 DIAGNOSIS — F3289 Other specified depressive episodes: Secondary | ICD-10-CM | POA: Insufficient documentation

## 2013-09-28 DIAGNOSIS — L89209 Pressure ulcer of unspecified hip, unspecified stage: Secondary | ICD-10-CM | POA: Insufficient documentation

## 2013-09-28 DIAGNOSIS — D649 Anemia, unspecified: Secondary | ICD-10-CM | POA: Insufficient documentation

## 2013-09-28 DIAGNOSIS — Z4800 Encounter for change or removal of nonsurgical wound dressing: Secondary | ICD-10-CM | POA: Insufficient documentation

## 2013-09-28 DIAGNOSIS — Z951 Presence of aortocoronary bypass graft: Secondary | ICD-10-CM | POA: Insufficient documentation

## 2013-09-28 DIAGNOSIS — Z8781 Personal history of (healed) traumatic fracture: Secondary | ICD-10-CM | POA: Insufficient documentation

## 2013-09-28 DIAGNOSIS — I252 Old myocardial infarction: Secondary | ICD-10-CM | POA: Insufficient documentation

## 2013-09-28 DIAGNOSIS — L89309 Pressure ulcer of unspecified buttock, unspecified stage: Secondary | ICD-10-CM | POA: Insufficient documentation

## 2013-09-28 DIAGNOSIS — Z85038 Personal history of other malignant neoplasm of large intestine: Secondary | ICD-10-CM | POA: Insufficient documentation

## 2013-09-28 LAB — RAPID URINE DRUG SCREEN, HOSP PERFORMED
AMPHETAMINES: NOT DETECTED
BARBITURATES: POSITIVE — AB
Benzodiazepines: POSITIVE — AB
Cocaine: NOT DETECTED
Opiates: POSITIVE — AB
Tetrahydrocannabinol: NOT DETECTED

## 2013-09-28 LAB — PHENOBARBITAL LEVEL: PHENOBARBITAL: 16.5 ug/mL (ref 15.0–40.0)

## 2013-09-28 MED ORDER — OXYCODONE HCL 5 MG PO TABS
20.0000 mg | ORAL_TABLET | Freq: Once | ORAL | Status: AC
  Administered 2013-09-28: 20 mg via ORAL
  Filled 2013-09-28: qty 4

## 2013-09-28 NOTE — ED Notes (Signed)
Phlebotomy called for labs to be obtained
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
## 2013-09-28 NOTE — BH Assessment (Signed)
Clinician consulted with Chris Colonel NP who states Chris [name] does not meet criteria for inpatient admission. Chris [name] discharged with outpatient referrals.  Clinician contacted Dr. Stevie Kern and Vicente Males, RN to provide recommendations.  Clinician faxed outpatient referral list to Vicente Males, RN to provide to Chris [name].    Fredonia Highland, MSW, LCSW Triage Specialist [phone]

## 2013-09-28 NOTE — Discharge Instructions (Signed)
Call for a follow up appointment with a Family or Primary Care Provider.  °Return if Symptoms worsen.   °Take medication as prescribed.  ° °
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
## 2013-09-28 NOTE — ED Notes (Signed)
[facility] called for transport.  

## 2013-09-28 NOTE — BH Assessment (Signed)
Clinician contacted Dr. Stevie Kern for report prior to initiating assessment. Clinician spoke to Dee,RN to request tele-assessment be put into pt's room.  Assessment to be initiated.   Fredonia Highland, MSW, LCSW Triage Specialist [phone]

## 2013-09-28 NOTE — ED Notes (Signed)
Patient sent to the ED for evaluation for possible overdoes from [facility] EMS reported staff stated patient overdosed on morphine.  Patient states "I did not and where would I get it" Patient states history of  Pressure ulcers and would like to have them evaluated. Pain currently left hip 9/10 achy sharp. Alert answering and following commands appropriate.

## 2013-09-29 NOTE — ED Provider Notes (Signed)
Medical screening examination/treatment/procedure(s) were conducted as a shared visit with non-physician practitioner(s) and myself.  I personally evaluated the patient during the encounter.   Stable chronic pain and ulcers (ulcers appear chronic without cellulitis; one is draining clear fluid), reportedly witnessed by SNF staff to take 2 pills, Pt denies sneaking pills, denies SI, HI, hallucinations, d/w SW and TTS who rec discharge back to SNF.  Babette Relic, MD 09/29/13 [phone]

## 2013-10-10 NOTE — ED Provider Notes (Signed)
Medical screening examination/treatment/procedure(s) were performed by non-physician practitioner and as supervising physician I was immediately available for consultation/collaboration.   EKG Interpretation None       Babette Relic, MD 10/10/13 [phone]

## 2014-02-21 DEATH — deceased

## 2014-11-03 IMAGING — CT CT ABD-PELV W/ CM
1 of 3 series · 13 of 32 positions shown, 18 images · IV contrast (omnipaque)
Comparison: 08/30/2012

CLINICAL DATA: Left side abdominal pain after fall today.  History
of splenic laceration.

CT ABDOMEN AND PELVIS WITH CONTRAST
TECHNIQUE: Multidetector CT imaging of the abdomen and pelvis was
performed following the standard protocol during bolus
administration of intravenous contrast.
Contrast: 100mL OMNIPAQUE IOHEXOL 300 MG/ML  SOLN

[Series 2: abd/pel with · axial · 0.91mm/px · z∈[+748,+1223]mm · 13 of 107 slices shown, 18 images]
[im 6/107  soft-tissue]
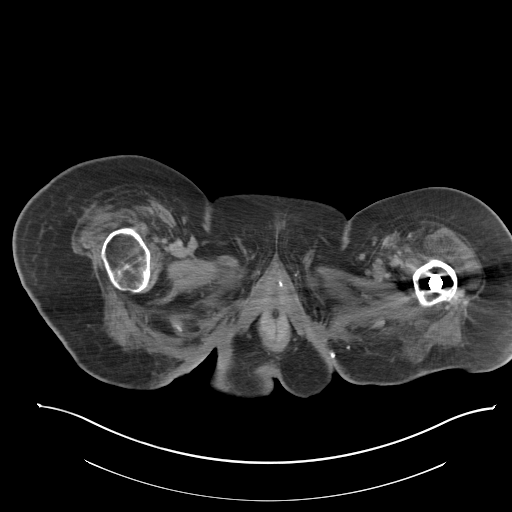
[im 6/107  bone]
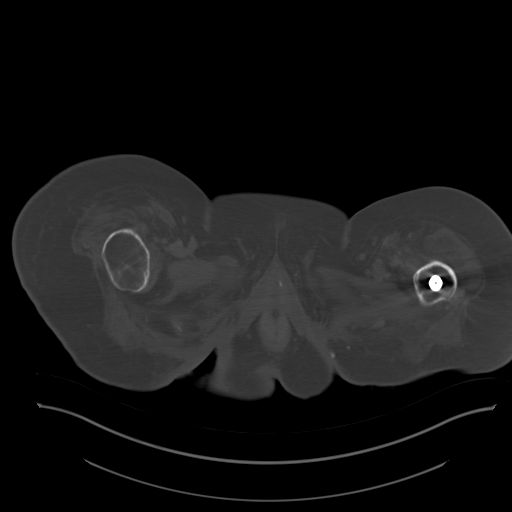
[im 17/107  soft-tissue]
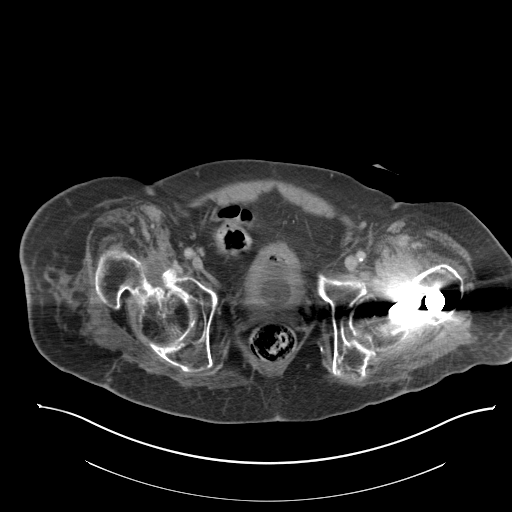
[im 23/107  soft-tissue]
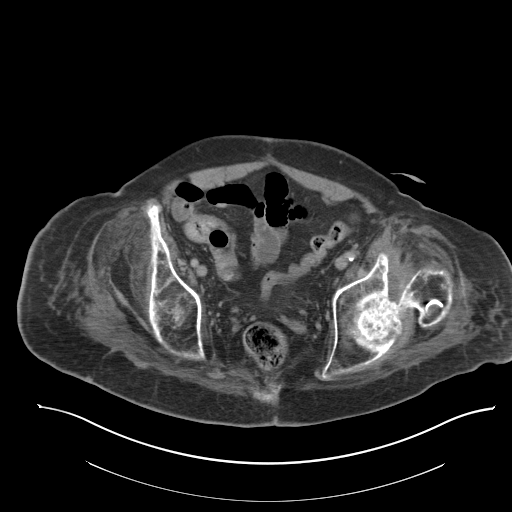
[im 34/107  soft-tissue]
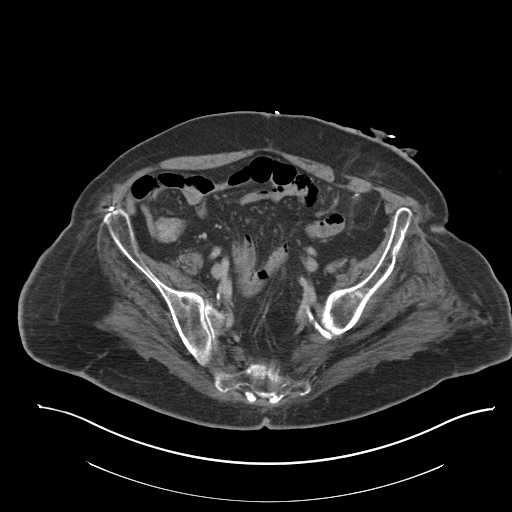
[im 40/107  soft-tissue]
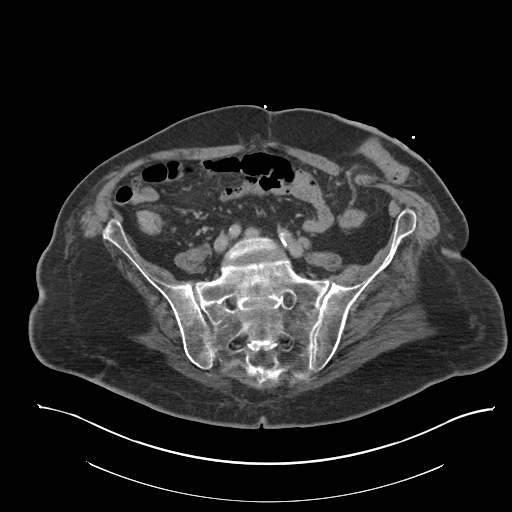
[im 51/107  soft-tissue]
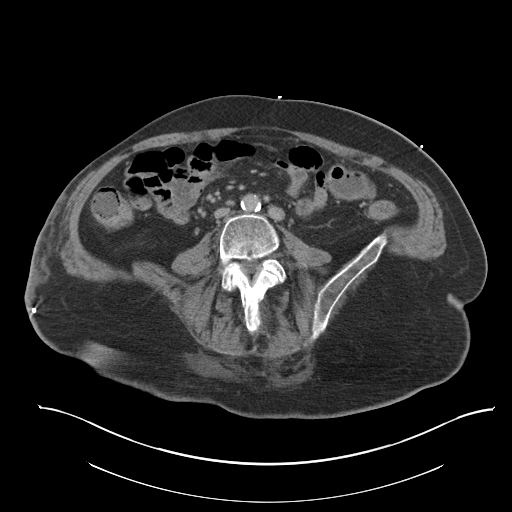
[im 56/107  soft-tissue]
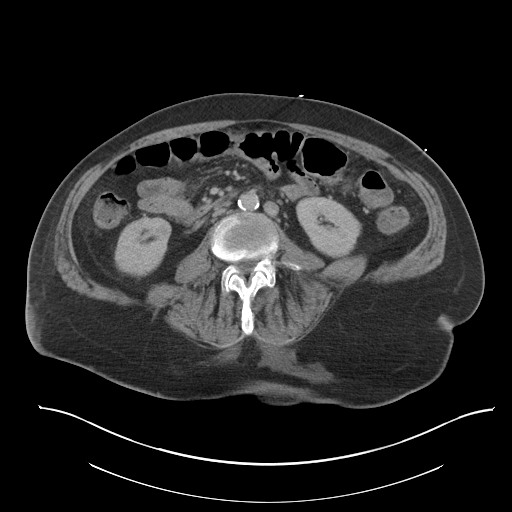
[im 67/107  soft-tissue]
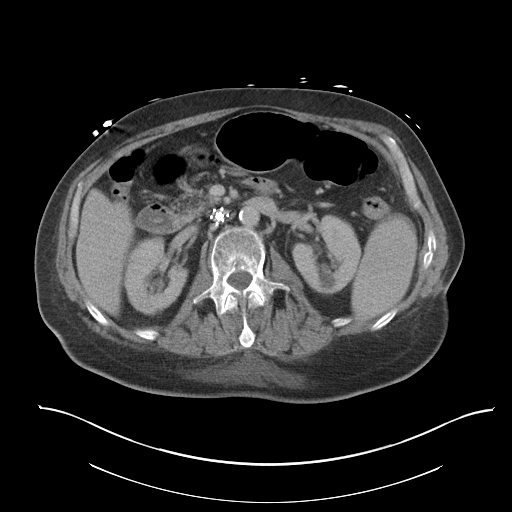
[im 73/107  soft-tissue]
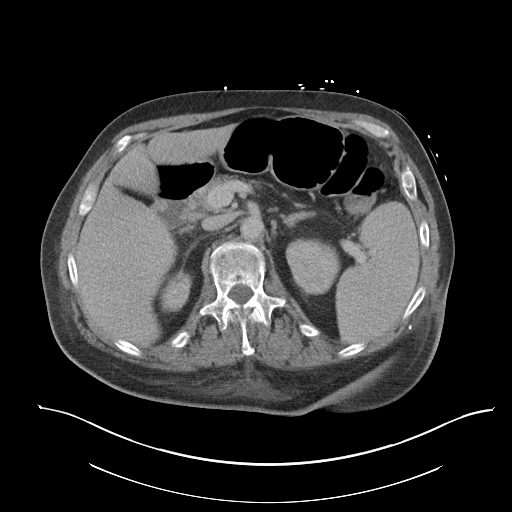
[im 73/107  bone]
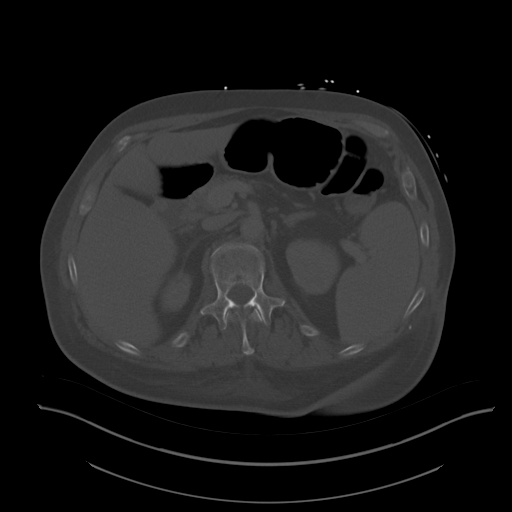
[im 84/107  soft-tissue]
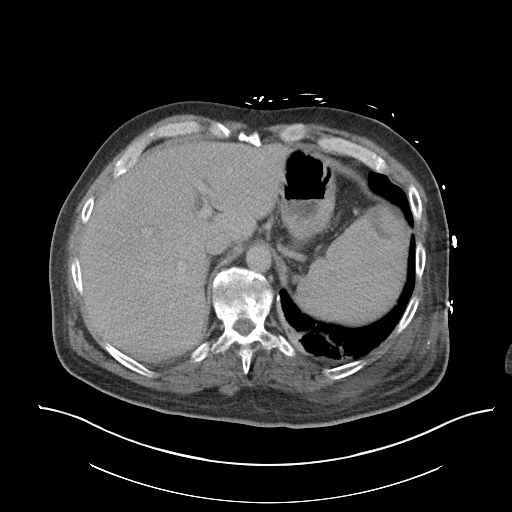
[im 84/107  lung]
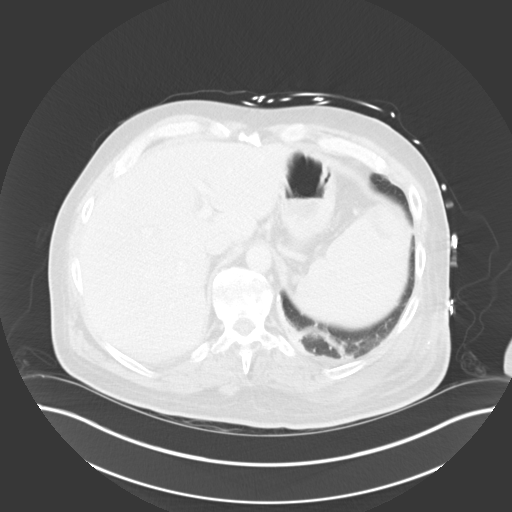
[im 90/107  soft-tissue]
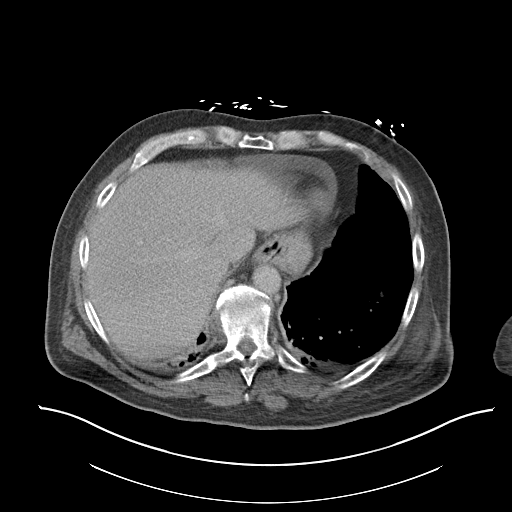
[im 90/107  lung]
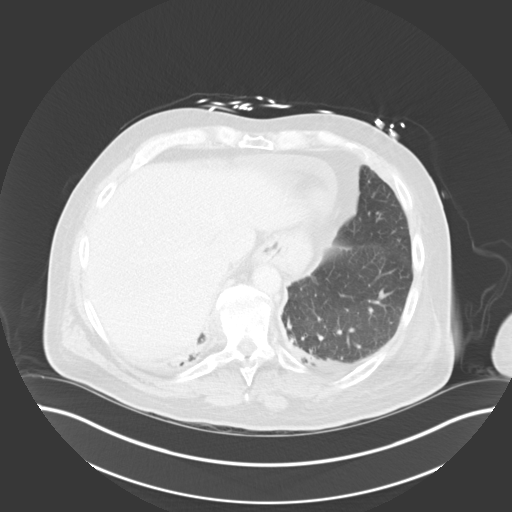
[im 95/107  lung]
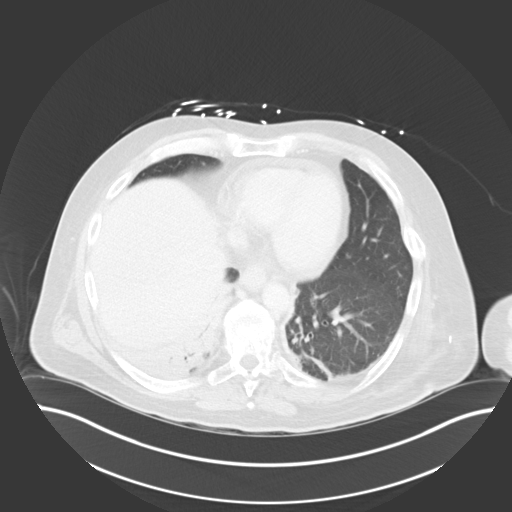
[im 101/107  soft-tissue]
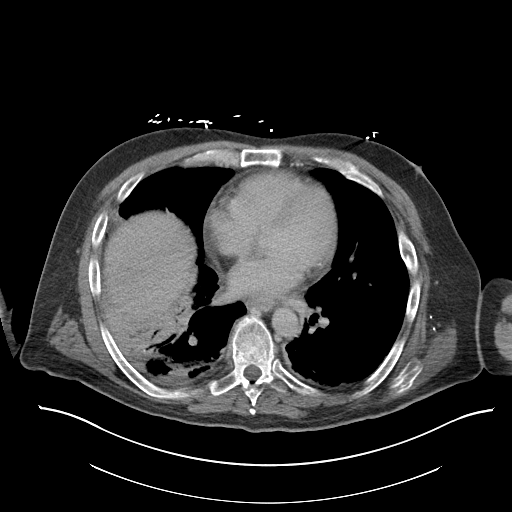
[im 101/107  lung]
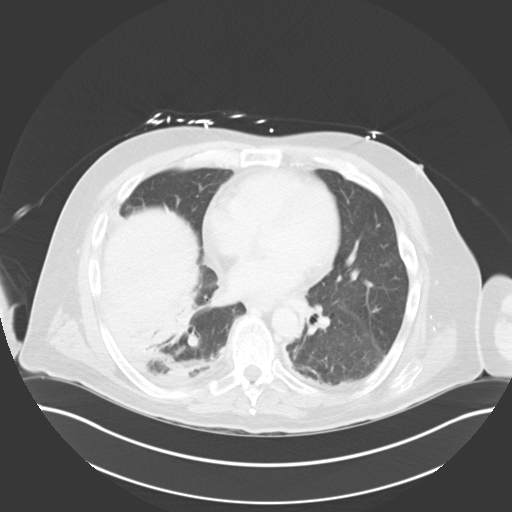

[13 of 32 positions shown; findings below may reference images not displayed]

FINDINGS: Lung bases:  Mild apparent nodularity the anterior right
lung base is new since the prior exam and therefore likely
atelectasis.  There is increased right and similar left base
dependent atelectasis.  No basilar pneumothorax.  Normal heart size
with age advanced coronary artery atherosclerosis.  Calcified lower
mediastinal nodes, as detailed on 08/25/2012.  Small hiatal hernia.
Small pericardial effusion which is new.  Trace left pleural fluid
thickening, improved.

Abdomen/pelvis:  Normal liver.  Improved appearance of the splenic
laceration, with hypoattenuation identified on image 26/series 2.
Resolved perisplenic hemorrhage.

Normal distal stomach.  Mild pancreatic atrophy. Cholecystectomy
without biliary ductal dilatation.  Normal adrenal glands and right
kidney.  An interpolar left renal cyst measures 1.8 cm.  There is
also a too small to characterize lower pole left renal lesion which
is not diagnostic of a simple cyst.

There is an IVC filter within the right IVC, terminating below the
renal veins.  However, there is also a left IVC, which is patent
and free of thrombus.

No retroperitoneal or retrocrural adenopathy.

Hartmann's pouch.  Descending colostomy. Normal terminal ileum.
Normal small bowel without abdominal ascites.

Left external iliac node is borderline enlarged 1.0 cm and
unchanged.  Urinary bladder collapsed and a Foley catheter.  Normal
prostate. No significant free fluid.

Bones/Musculoskeletal:  Question skin irregularity/decubitus ulcer
about the right ischial region on image 100/series 2.  Osteopenia.
Left proximal femoral fixation.  Left scapular tip is fractured on
image 6/series 2.  This was similar on the 08/30/2012 exam.  There
is also a fracture of the left tenth posterolateral rib on image
41/series 2.  This is likely new since the prior of 08/30/2012.
Other left-sided rib fractures or remote.
IMPRESSION: 1.  A left tenth rib fracture which is likely acute since
08/30/2012.
[DATE].  Improved splenic laceration with resolved perisplenic hematoma.
3.  No other acute post-traumatic deformity identified.
4.  Double IVC with filter only identified in the right-sided vena
cava.  The patient may benefit from a second filter in the left
IVC.
5.  Subacute left-sided scapular tip fracture, incompletely imaged.
6.  New small pericardial effusion.
